# Patient Record
Sex: Female | Born: 1980 | Race: White | Hispanic: No | Marital: Married | Smoking: Never smoker
Health system: Southern US, Community
[De-identification: ages and names within clinical notes are randomized; demographics above are authoritative.]

## PROBLEM LIST (undated history)

## (undated) DIAGNOSIS — O26899 Other specified pregnancy related conditions, unspecified trimester: Secondary | ICD-10-CM

## (undated) DIAGNOSIS — G5603 Carpal tunnel syndrome, bilateral upper limbs: Secondary | ICD-10-CM

## (undated) DIAGNOSIS — N979 Female infertility, unspecified: Secondary | ICD-10-CM

## (undated) DIAGNOSIS — O3090 Multiple gestation, unspecified, unspecified trimester: Secondary | ICD-10-CM

## (undated) DIAGNOSIS — J4520 Mild intermittent asthma, uncomplicated: Secondary | ICD-10-CM

## (undated) DIAGNOSIS — R12 Heartburn: Secondary | ICD-10-CM

## (undated) DIAGNOSIS — Z6791 Unspecified blood type, Rh negative: Secondary | ICD-10-CM

## (undated) DIAGNOSIS — Z98891 History of uterine scar from previous surgery: Secondary | ICD-10-CM

## (undated) HISTORY — DX: Mild intermittent asthma, uncomplicated: J45.20

## (undated) HISTORY — PX: WISDOM TOOTH EXTRACTION: SHX21

## (undated) HISTORY — PX: INGUINAL HERNIA REPAIR: SUR1180

## (undated) HISTORY — DX: Female infertility, unspecified: N97.9

## (undated) HISTORY — PX: NO PAST SURGERIES: SHX2092

---

## 2008-06-17 ENCOUNTER — Encounter: Payer: Self-pay | Admitting: Internal Medicine

## 2008-06-17 ENCOUNTER — Ambulatory Visit: Payer: Self-pay | Admitting: Internal Medicine

## 2008-06-17 ENCOUNTER — Other Ambulatory Visit: Admission: RE | Admit: 2008-06-17 | Discharge: 2008-06-17 | Payer: Self-pay | Admitting: Internal Medicine

## 2008-06-17 DIAGNOSIS — J45909 Unspecified asthma, uncomplicated: Secondary | ICD-10-CM | POA: Insufficient documentation

## 2008-06-22 ENCOUNTER — Encounter: Payer: Self-pay | Admitting: Internal Medicine

## 2008-07-09 ENCOUNTER — Telehealth: Payer: Self-pay | Admitting: Internal Medicine

## 2008-07-17 ENCOUNTER — Ambulatory Visit: Payer: Self-pay | Admitting: Family Medicine

## 2008-07-17 DIAGNOSIS — N6019 Diffuse cystic mastopathy of unspecified breast: Secondary | ICD-10-CM

## 2008-07-17 HISTORY — DX: Diffuse cystic mastopathy of unspecified breast: N60.19

## 2009-03-13 ENCOUNTER — Telehealth: Payer: Self-pay | Admitting: Family Medicine

## 2009-03-13 ENCOUNTER — Encounter: Payer: Self-pay | Admitting: Internal Medicine

## 2009-06-28 ENCOUNTER — Ambulatory Visit: Payer: Self-pay | Admitting: Internal Medicine

## 2009-07-30 ENCOUNTER — Ambulatory Visit: Payer: Self-pay | Admitting: Internal Medicine

## 2009-09-08 ENCOUNTER — Telehealth: Payer: Self-pay | Admitting: Family Medicine

## 2010-01-08 ENCOUNTER — Encounter: Payer: Self-pay | Admitting: Internal Medicine

## 2010-01-08 ENCOUNTER — Emergency Department: Payer: Self-pay | Admitting: Emergency Medicine

## 2010-01-08 ENCOUNTER — Encounter: Payer: Self-pay | Admitting: Pulmonary Disease

## 2010-01-21 ENCOUNTER — Encounter: Payer: Self-pay | Admitting: Internal Medicine

## 2010-01-21 ENCOUNTER — Ambulatory Visit: Payer: Self-pay | Admitting: Internal Medicine

## 2010-02-22 ENCOUNTER — Ambulatory Visit: Payer: Self-pay | Admitting: Internal Medicine

## 2010-02-22 ENCOUNTER — Telehealth: Payer: Self-pay | Admitting: Internal Medicine

## 2010-03-08 ENCOUNTER — Encounter: Payer: Self-pay | Admitting: Pulmonary Disease

## 2010-03-08 ENCOUNTER — Ambulatory Visit: Payer: Self-pay | Admitting: Pulmonary Disease

## 2010-03-19 ENCOUNTER — Encounter: Payer: Self-pay | Admitting: Pulmonary Disease

## 2010-03-31 ENCOUNTER — Ambulatory Visit
Admission: RE | Admit: 2010-03-31 | Discharge: 2010-03-31 | Payer: Self-pay | Source: Home / Self Care | Attending: Family Medicine | Admitting: Family Medicine

## 2010-04-04 ENCOUNTER — Encounter
Admission: RE | Admit: 2010-04-04 | Discharge: 2010-04-04 | Payer: Self-pay | Source: Home / Self Care | Attending: Family Medicine | Admitting: Family Medicine

## 2010-04-04 LAB — HM MAMMOGRAPHY: HM Mammogram: NORMAL

## 2010-04-05 ENCOUNTER — Ambulatory Visit
Admission: RE | Admit: 2010-04-05 | Discharge: 2010-04-05 | Payer: Self-pay | Source: Home / Self Care | Attending: Pulmonary Disease | Admitting: Pulmonary Disease

## 2010-04-05 ENCOUNTER — Encounter: Payer: Self-pay | Admitting: Pulmonary Disease

## 2010-04-19 NOTE — Progress Notes (Signed)
Summary: pt is bloated  Phone Note Call from Patient Call back at Home Phone [phone]   Caller: Patient Call For: Cindee Salt MD Summary of Call: Pt states she is very bloated x 4 days- abdominal.  She says she gained 6 pounds in 5 days while out of town in [place].  She says her abd and sides are very tender.  She had a small BM today, which was the first one since saturday morning.  She usually drinks coffee and that brings on BM's but that isint working.  Should she try a laxative? Initial call taken by: Lowella Petties CMA,  September 08, 2009 2:39 PM  Follow-up for Phone Call        discussed Follow-up by: Hannah Beat MD,  September 08, 2009 2:42 PM  Additional Follow-up for Phone Call Additional follow up Details #1::        Advised pt to try miralax, colace stool softener, lots of water.  Call back if not better. Additional Follow-up by: Lowella Petties CMA,  September 08, 2009 2:44 PM

## 2010-04-19 NOTE — Progress Notes (Signed)
Summary: Asthma  Phone Note Call from Patient Call back at Home Phone [phone]   Caller: Patient Call For: Cindee Salt MD Summary of Call: pt came in for x-ray today and also wanted to ask if she could get a referral to pulmonary? pt states she had a really bad asthma attack and had to take the Ventolin 3times before it calmed down. Pt thinks she needs a specialist, pt would like something in [place] or [place]. Please advise. Initial call taken by: Mervin Hack CMA Duncan Dull),  February 22, 2010 11:24 AM  Follow-up for Phone Call        Please let her know that the CXR was normal  I will go ahead with pulmonary referral Follow-up by: Cindee Salt MD,  February 22, 2010 1:17 PM

## 2010-04-19 NOTE — Assessment & Plan Note (Signed)
Summary: 1 m f/u dlo   Vital Signs:  Patient profile:   30 year old female Weight:      164 pounds Temp:     98.3 degrees F oral Pulse rate:   78 / minute Pulse rhythm:   regular Resp:     12 per minute BP sitting:   122 / 80  (left arm) Cuff size:   regular  Vitals Entered By: Mervin Hack CMA Duncan Dull) (Jul 30, 2009 9:21 AM) CC: 1 month follow-up   History of Present Illness: Does feel better Still gets some tight sensation at night--able to avoid the inhaler Not as noticeable post prandial problems  Has been using inhaler before exercise--then does well without more need No restrictions on activity  No sig cough No nocturnal cough  Allergies: 1)  ! Adult Aspirin Ec Low Strength (Aspirin)  Past History:  Past medical, surgical, family and social histories (including risk factors) reviewed for relevance to current acute and chronic problems.  Past Medical History: Reviewed history from 06/17/2008 and no changes required. Asthma--mild intermittent  Past Surgical History: Reviewed history from 06/17/2008 and no changes required. Denies surgical history  Family History: Reviewed history from 06/17/2008 and no changes required. Dad has HTN Mom healthy 1 brother 1 sister--obesity No CAD, DM< No breast cancer or colon cancer  Social History: Reviewed history from 06/28/2009 and no changes required. 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   Neck:  supple, no masses, no thyromegaly, no carotid bruits, and no cervical lymphadenopathy.   Lungs:  normal respiratory effort, no intercostal retractions, no accessory muscle use, normal breath sounds, no crackles, and no wheezes.   Additional Exam:  spirometry shows 10% increase in FEV1 but slight decrease in  FVC   Impression & Recommendations:  Problem # 1:  ASTHMA (ICD-493.90) Assessment Improved  clinically improved--back to mild intermittent status spirometry not really much different though continue current dose consider wean down in 6 months if stable  Her updated medication list for this problem includes:    Ventolin Hfa 108 (90 Base) Mcg/act Aers (Albuterol sulfate) .Marland Kitchen... 2 puffs three times a day as needed for asthma symptoms    Qvar 80 Mcg/act Aers (Beclomethasone dipropionate) .Marland Kitchen... 1 inhalation two times a day with spacer. rinse mouth after  Orders: Spirometry w/Graph (94010)  Complete Medication List: 1)  Ventolin Hfa 108 (90 Base) Mcg/act Aers (Albuterol sulfate) .... 2 puffs three times a day as needed for asthma symptoms 2)  Qvar 80 Mcg/act Aers (Beclomethasone dipropionate) .Marland Kitchen.. 1 inhalation two times a day with spacer. rinse mouth after  Patient Instructions: 1)  Please schedule a follow-up appointment in 6 months .   Current Allergies (reviewed today): ! ADULT ASPIRIN EC LOW STRENGTH (ASPIRIN)

## 2010-04-19 NOTE — Assessment & Plan Note (Signed)
Summary: ROA FOR 6 MONTH FOLLOW-UP/JRR   Vital Signs:  Patient profile:   30 year old female Weight:      152 pounds Temp:     98.3 degrees F oral Resp:     18 per minute BP sitting:   120 / 80  (left arm) Cuff size:   regular  Vitals Entered By: Mervin Hack CMA Duncan Dull) (January 21, 2010 9:37 AM) CC: 6 month follow-up   History of Present Illness: Caught cold a few weeks ago tried cold meds sneezing, nasal congestion, body aches. May have had fever Then started struggling with her breathing Had DOE --so tried steam inhalation  Got worse and went to the [facility] ER 10/22 CXR showed "spot of pneumonia" Got 2 breathing treatments Antibiotic and prednisone- four days only did have flare of her asthma again last night  Stopped q-var this summer Had no sig problems off it--would occ get a bad day Using the rescue inhaler just before she ran and othewise  ~once per week   Allergies: 1)  ! Adult Aspirin Ec Low Strength (Aspirin)  Past History:  Past medical, surgical, family and social histories (including risk factors) reviewed for relevance to current acute and chronic problems.  Past Medical History: Reviewed history from 06/17/2008 and no changes required. Asthma--mild intermittent  Past Surgical History: Reviewed history from 06/17/2008 and no changes required. Denies surgical history  Family History: Reviewed history from 06/17/2008 and no changes required. Dad has HTN Mom healthy 1 brother 1 sister--obesity No CAD, DM< No breast cancer or colon cancer  Social History: Reviewed history from 06/28/2009 and no changes required. 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   Nose:  no mucosal edema or sig inflammation Mouth:  no  erythema and no exudates.   Neck:  supple, no masses, and no cervical lymphadenopathy.   Lungs:  normal respiratory effort, no intercostal retractions, no accessory muscle use, normal breath sounds, no dullness, no crackles, and no wheezes.     Impression & Recommendations:  Problem # 1:  PNEUMONIA (ICD-486) Assessment New sounds like this is in doubt I will review the ER records when I can get them (no success with multiple efforts today)   got them did have questionable right hilar area will set up CXR in about 1 month  Problem # 2:  ASTHMA (ICD-493.90) Assessment: Deteriorated  seems to have mild persistent symptoms prior to this illness doesn't want inhaled steroids as it really made her gain weight  P: will try 10 day prednisone course   ipratropium inhaler for now    consider singulair  The following medications were removed from the medication list:    Qvar 80 Mcg/act Aers (Beclomethasone dipropionate) .Marland Kitchen... 1 inhalation two times a day with spacer. rinse mouth after Her updated medication list for this problem includes:    Ventolin Hfa 108 (90 Base) Mcg/act Aers (Albuterol sulfate) .Marland Kitchen... 2 puffs three times a day as needed for asthma symptoms    Prednisone 20 Mg Tabs (Prednisone) .Marland Kitchen... 2 tabs daily for 5 days, then 1 tab daily for 5 days    Atrovent Hfa 17 Mcg/act Aers (Ipratropium bromide hfa) .Marland Kitchen... 2 inhalations with spacer three times a day for asthma  Orders: Spirometry  w/Graph (94010)  Complete Medication List: 1)  Ventolin Hfa 108 (90 Base) Mcg/act Aers (Albuterol sulfate) .... 2 puffs three times a day as needed for asthma symptoms 2)  Prednisone 20 Mg Tabs (Prednisone) .... 2 tabs daily for 5 days, then 1 tab daily for 5 days 3)  Atrovent Hfa 17 Mcg/act Aers (Ipratropium bromide hfa) .... 2 inhalations with spacer three times a day for asthma  Patient Instructions: 1)  Please schedule a follow-up appointment in 3-4  months for physical 2)  Please set up CXR  in about 1 month to follow up on possible pneumonia Prescriptions: VENTOLIN HFA 108 (90 BASE) MCG/ACT AERS (ALBUTEROL SULFATE) 2 puffs three times a day as needed for asthma symptoms  #1 x 1   Entered and Authorized by:   Cindee Salt MD   Signed by:   Cindee Salt MD on 01/21/2010   Method used:   Electronically to        [facility] [address] (retail)       [address], [address]       [place], [place]  [address]       Ph: [phone]       Fax: [phone]   RxID:   [id number] ATROVENT HFA 17 MCG/ACT AERS (IPRATROPIUM BROMIDE HFA) 2 inhalations with spacer three times a day for asthma  #1 x 11   Entered and Authorized by:   Cindee Salt MD   Signed by:   Cindee Salt MD on 01/21/2010   Method used:   Electronically to        [facility] [address] (retail)       [address], [address]       [place], [place]  [address]       Ph: [phone]       Fax: [phone]   RxID:   [id number] PREDNISONE 20 MG TABS (PREDNISONE) 2 tabs daily for 5 days, then 1 tab daily for 5 days  #15 x 0   Entered and Authorized by:   Cindee Salt MD   Signed by:   Cindee Salt MD on 01/21/2010   Method used:   Electronically to        [facility] [address] (retail)       [address], [address]       [place], [place]  [address]       Ph: [phone]       Fax: [phone]   RxID:   [id number]    Orders Added: 1)  Est. Patient Level IV [51761] 2)  Spirometry w/Graph [60737]    Current Allergies (reviewed today): ! ADULT ASPIRIN EC LOW STRENGTH (ASPIRIN)

## 2010-04-19 NOTE — Assessment & Plan Note (Signed)
Summary: PROBLEMS WITH ASTHMA   Vital Signs:  Patient profile:   30 year old female Weight:      166.25 pounds BMI:     30.52 O2 Sat:      99 % on Room air Temp:     98.0 degrees F oral Pulse rate:   76 / minute Pulse rhythm:   regular Resp:     14 per minute BP sitting:   120 / 82  (left arm) Cuff size:   regular  Vitals Entered By: Mervin Hack CMA Duncan Dull) (June 28, 2009 11:38 AM)  O2 Flow:  Room air CC: asthma   History of Present Illness:  Asthma has been acting up having weird triggers Notices trouble breathing after eating at times "to do anything quickly is a trouble"  using inhaler daily at least started about 3-4 weeks ago  Has lived here 27 years no previous problems with tree pollen  Has had to use inhaler before any  physical activity still walks and plays dodge ball  No major cough  No mood issues gets slightly anxious when some SOB---otherwise mood okay  CXR done at [facility] on Christmas eve see phone note--had flare after using super glue  Allergies: 1)  ! Adult Aspirin Ec Low Strength (Aspirin)  Past History:  Past medical, surgical, family and social histories (including risk factors) reviewed for relevance to current acute and chronic problems.  Past Medical History: Reviewed history from 06/17/2008 and no changes required. Asthma--mild intermittent  Past Surgical History: Reviewed history from 06/17/2008 and no changes required. Denies surgical history  Family History: Reviewed history from 06/17/2008 and no changes required. 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   Nose:  no sig allergic  swelling Mouth:  no erythema and no exudates.   Neck:  supple and no masses.   Lungs:  normal respiratory effort, no intercostal retractions, no accessory muscle use, normal breath sounds, no crackles, and no wheezes.   Heart:  normal rate, regular rhythm, no murmur, and no gallop.   Additional Exam:  Spirometry---  not great technique                         Moderate obstruction   Impression & Recommendations:  Problem # 1:  ASTHMA (ICD-493.90) Assessment Deteriorated  will add inhaled steroid should allow decreased symptoms or would consider adding long acting beta agonist as well may have been triggered by the super glue exposure but this seemed not that significant and symptoms are bad several months later   Her updated medication list for this problem includes:    Ventolin Hfa 108 (90 Base) Mcg/act Aers (Albuterol sulfate) .Marland Kitchen... 2 puffs three times a day as needed for asthma symptoms    Qvar 80 Mcg/act Aers (Beclomethasone dipropionate) .Marland Kitchen... 1 inhalation two times a day with spacer. rinse mouth after  Orders: Spirometry w/Graph (94010)  Complete Medication List: 1)  Ventolin Hfa 108 (90 Base) Mcg/act Aers (Albuterol sulfate) .... 2 puffs three times a day as needed for asthma symptoms 2)  Qvar 80 Mcg/act Aers (Beclomethasone dipropionate) .Marland Kitchen.. 1 inhalation two times a day with spacer.  rinse mouth after  Patient Instructions: 1)  Please schedule a follow-up appointment in 1 month.  Prescriptions: QVAR 80 MCG/ACT AERS (BECLOMETHASONE DIPROPIONATE) 1 inhalation two times a day with spacer. Rinse mouth after  #1 x 11   Entered and Authorized by:   Cindee Salt MD   Signed by:   Cindee Salt MD on 06/28/2009   Method used:   Electronically to        [facility] [address] (retail)       [address], [address]       [place], [place]  [address]       Ph: [phone]       Fax: [phone]   RxID:   [id number]   Current  Allergies (reviewed today): ! ADULT ASPIRIN EC LOW STRENGTH (ASPIRIN)

## 2010-04-21 NOTE — Assessment & Plan Note (Signed)
Summary: consult for asthma   Copy to:  [facility] Primary Melissia Lahman/Referring Remi Rester:  Cindee Salt MD  CC:  Pulmonary Consult.  History of Present Illness: the pt is a 30y/o female who I have been asked to see for management of asthma.  She was diagnosed with asthma as a child, and her symptoms have primarily been with exercise and large temperature changes during the year.  She felt her control was adequate until this year, when she began to have sob with mild exertional activities and sometimes with eating.  However, she could run 3-4 miles and not have any issues (although she would use her ventolin inhaler beforehand for EIA).  The pt has had what sounds like acute asthmatic bronchitis in Oct of this year, and required abx and a course of prednisone before improving.  She has had a recent cxr that was normal, and recent spirometry shows poor technique with inadequate flow volume loop.  Spirometry from April of this year was adequate and did show airflow obstruction.  The pt has been tried on qvar in the past, but does not remember if this helped or not.  She is convinced this did result in weight gain while using it, and therefore she discontinued.  She currently has some cough, but no mucus or chest congestion.  She has some hoarseness that is ongoing, but denies PND or reflux symptoms.  She does not have a h/o frequent sinus disease or allergies.  Current Medications (verified): 1)  Ventolin Hfa 108 (90 Base) Mcg/act Aers (Albuterol Sulfate) .... 2 Puffs Three Times A Day As Needed For Asthma Symptoms  Allergies (verified): 1)  ! Adult Aspirin Ec Low Strength (Aspirin)  Past History:  Past Medical History: Reviewed history from 06/17/2008 and no changes required. Asthma--mild intermittent  Past Surgical History: Reviewed history from 06/17/2008 and no changes required. Denies surgical history  Family History: Reviewed history from 06/17/2008 and no changes  required. Dad has HTN Mom healthy 1 brother 1 sister--obesity No CAD, DM< No breast cancer or colon cancer  Social History: Reviewed history from 06/28/2009 and no changes required. 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  The patient denies coughing up blood, chest pain, irregular heartbeats, acid heartburn, indigestion, loss of appetite, abdominal pain, tooth/dental problems, headaches, nasal congestion/difficulty breathing through nose, sneezing, itching, ear ache, anxiety, depression, hand/feet swelling, joint stiffness or pain, rash, change in color of mucus, and fever.    Vital Signs:  Patient profile:   30 year old female Height:      62 inches Weight:      158.13 pounds BMI:     29.03 O2 Sat:      96 % on Room air Temp:     98.4 degrees F oral Pulse rate:   89 / minute BP sitting:   126 / 82  (left arm) Cuff size:   regular  Vitals Entered By: Arman Filter LPN (March 08, 2010 8:55 AM)  O2 Flow:  Room air CC: Pulmonary Consult Comments Medications reviewed with patient Arman Filter LPN  March 08, 2010 8:56 AM    Physical Exam  General:  wd female in nad Eyes:  PERRLA and EOMI.   Nose:  patent without discharge,  no purulence seen Mouth:  no exudates or lesions seen Neck:  no jvd, tmg, LN Lungs:  clear to auscultation, no wheezing or rhonchi Heart:  rrr, no mrg Abdomen:  soft and nontender, bs+ Extremities:  no edema noted, pulses intact distally no cyanosis Neurologic:  alert and oriented, moves all 4.   Impression & Recommendations:  Problem # 1:  ASTHMA (ICD-493.90) the pt's spirometry today shows clear cut airflow obstruction.  She will need to get on an ICS,  and stay on this religiously.  I have had a long conversation with her about the role of airway inflammation in asthma, and that it requires daily maintenance meds to keep controlled.  I have also explained that if we do not control the inflammation, she is at risk for fixed obstructive disease later in life due to airway remodeling.  She believes that qvar caused excessive weight gain (unlikely), so will try her on asmanex at bedtime.  Hopefully, she does not require combination therapy.    Medications Added to Medication List This Visit: 1)  Asmanex 60 Metered Doses 220 Mcg/inh Aepb (Mometasone furoate) .... 2 each night at bedtime  Other Orders: Consultation Level IV (21308) Spirometry w/Graph (94010)  Patient Instructions: 1)  stop atrovent inhaler 2)  use ventolin for rescue only 3)  asmanex 2 inhalations each night at bedtime 4)  followup with me in 4 weeks.  Prescriptions: ASMANEX 60 METERED DOSES 220 MCG/INH AEPB (MOMETASONE FUROATE) 2 each night at bedtime  #1 x 6   Entered and Authorized by:   Barbaraann Share MD   Signed by:   Barbaraann Share MD on 03/08/2010   Method used:   Print then Give to Patient   RxID:   [id number]

## 2010-04-21 NOTE — Assessment & Plan Note (Signed)
Summary: rov for asthma   Copy to:  [facility] Primary Provider/Referring Provider:  Cindee Salt MD  CC:  4 week f/u appt.  Pt states breathing is unchanged since starting Asmanex.  Does c/o constipation and wonders if this is d/t the Asmanex.  Pt also states she is having to use rescue hfa every time she exercises. .  History of Present Illness: the pt comes in today for f/u of her known asthma.  She has been started on asmanex for maintenance therapy, and feels this is well tolerated and works well for her.  She still has to use her rescue inhaler for cold air exposure and before she exercises, but I wonder if this is out of fear her maintenance inhaler will not hold her during her exertional activities.  She does not feel she is overusing her albuterol.    Medications Prior to Update: 1)  Ventolin Hfa 108 (90 Base) Mcg/act Aers (Albuterol Sulfate) .... 2 Puffs Three Times A Day As Needed For Asthma Symptoms 2)  Asmanex 60 Metered Doses 220 Mcg/inh Aepb (Mometasone Furoate) .... 2 Each Night At Bedtime  Allergies (verified): 1)  ! Adult Aspirin Ec Low Strength (Aspirin)  Review of Systems       The patient complains of shortness of breath with activity, shortness of breath at rest, sneezing, and itching.  The patient denies productive cough, non-productive cough, coughing up blood, chest pain, irregular heartbeats, acid heartburn, indigestion, loss of appetite, weight change, abdominal pain, difficulty swallowing, sore throat, tooth/dental problems, headaches, nasal congestion/difficulty breathing through nose, ear ache, anxiety, depression, hand/feet swelling, joint stiffness or pain, rash, change in color of mucus, and fever.    Vital Signs:  Patient profile:   30 year old female Height:      62 inches Weight:      157.50 pounds BMI:     28.91 O2 Sat:      98 % on Room air Temp:     97.6 degrees F oral Pulse rate:   84 / minute BP sitting:   124 / 76  (left arm) Cuff  size:   regular  Vitals Entered By: Arman Filter LPN (April 05, 2010 9:30 AM)  O2 Flow:  Room air CC: 4 week f/u appt.  Pt states breathing is unchanged since starting Asmanex.  Does c/o constipation and wonders if this is d/t the Asmanex.  Pt also states she is having to use rescue hfa every time she exercises.  Comments Medications reviewed with patient Arman Filter LPN  April 05, 2010 9:34 AM    Physical Exam  General:  wd female in nad Lungs:  clear to auscultation Heart:  rrr Extremities:  no edema or cyanosis  Neurologic:  alert and oriented, moves all 4.   Impression & Recommendations:  Problem # 1:  ASTHMA (ICD-493.90) the pt is still requiring albuterol prior to exercise and with cold air exposure despite taking asmanex compliantly.  She is not having to overuse her rescue inhaler.  Her spirometry today is a little better, but still has mild airflow obstruction.  We discussed today the possibility of stepping up her asthma therapy to LABA/ICS, but pt feels she is better and would like to give the asmanex a little more time.  I am ok with this, provided she does not have to overuse her albuterol.    Other Orders: Est. Patient Level III ([address]) Spirometry w/Graph (94010)  Patient Instructions: 1)  stay on asmanex 2 puffs  at bedtime. 2)  let me know if you are not doing as well as you think you should, or if you have to overuse your rescue inhaler. 3)  followup with me in 6mos.

## 2010-04-21 NOTE — Assessment & Plan Note (Signed)
Summary: RIGHT BREAST IS HURTING/ lb   Vital Signs:  Patient profile:   30 year old female Height:      62 inches Weight:      156 pounds BMI:     28.64 Temp:     98.3 degrees F oral Pulse rate:   65 / minute Pulse rhythm:   regular BP sitting:   120 / 82  (left arm) Cuff size:   regular  Vitals Entered By: Linde Gillis CMA Duncan Dull) (March 31, 2010 9:40 AM) CC: right breast pain   History of Present Illness: 30 yo here for right breast pain.  Two days ago, woke up with 8/10 right breast tenderness, started at 12 oclock position and now more at 9 oclock.  Was very TTP and she thought she felt a mass.  NO erythema or warmth.  Pain has now subsided.  There was not trauma or injury to the breast.  Not menstruating, had her period 2 weeks ago. Never had anything like this before.  No discharge or changes of her nipple. No FH of breast CA.  Current Medications (verified): 1)  Ventolin Hfa 108 (90 Base) Mcg/act Aers (Albuterol Sulfate) .... 2 Puffs Three Times A Day As Needed For Asthma Symptoms 2)  Asmanex 60 Metered Doses 220 Mcg/inh Aepb (Mometasone Furoate) .... 2 Each Night At Bedtime  Allergies: 1)  ! Adult Aspirin Ec Low Strength (Aspirin)  Past History:  Past Medical History: Last updated: 06/17/2008 Asthma--mild intermittent  Past Surgical History: Last updated: 06/17/2008 Denies surgical history  Family History: Last updated: 06/17/2008 Dad has HTN Mom healthy 1 brother 1 sister--obesity No CAD, DM< No breast cancer or colon cancer  Social History: Last updated: 03/08/2010 Occupation: Automotive engineer for [employer] Married 6/10 to [employer] No children Never Smoked Alcohol use-yes  Risk Factors: Smoking Status: never (06/17/2008)  Review of Systems      See HPI General:  Denies fever and malaise. GI:  Denies nausea and vomiting.  Physical Exam  General:  alert and normal appearance.   Mouth:  no erythema and no exudates.   Breasts:   skin/areolae normal, no abnormal thickening, no nipple discharge, and no adenopathy.   Right breast does feel more dense, fibrous dense area at 12 oclock position, non tender to palpation. Psych:  normally interactive, good eye contact, not anxious appearing, and not depressed appearing.     Impression & Recommendations:  Problem # 1:  ? of LUMP OR MASS IN BREAST (ICD-611.72) Assessment New ? fibrocystic area vs breast abscess. Will get mammogram as we cannot localize one area of tenderness. Pt in agreement with plan. Orders: Radiology Referral (Radiology)  Complete Medication List: 1)  Ventolin Hfa 108 (90 Base) Mcg/act Aers (Albuterol sulfate) .... 2 puffs three times a day as needed for asthma symptoms 2)  Asmanex 60 Metered Doses 220 Mcg/inh Aepb (Mometasone furoate) .... 2 each night at bedtime  Patient Instructions: 1)  Please stop by to see Shirlee Limerick on your way out.     Orders Added: 1)  Radiology Referral [Radiology] 2)  Est. Patient Level IV [72536]    Current Allergies (reviewed today): ! ADULT ASPIRIN EC LOW STRENGTH (ASPIRIN)

## 2010-04-30 ENCOUNTER — Encounter: Payer: Self-pay | Admitting: Internal Medicine

## 2010-04-30 DIAGNOSIS — N979 Female infertility, unspecified: Secondary | ICD-10-CM

## 2010-04-30 HISTORY — DX: Female infertility, unspecified: N97.9

## 2010-06-23 ENCOUNTER — Encounter: Payer: Self-pay | Admitting: Internal Medicine

## 2010-06-23 ENCOUNTER — Other Ambulatory Visit: Payer: Self-pay | Admitting: Internal Medicine

## 2010-06-23 ENCOUNTER — Ambulatory Visit (INDEPENDENT_AMBULATORY_CARE_PROVIDER_SITE_OTHER): Payer: BC Managed Care – PPO | Admitting: Internal Medicine

## 2010-06-23 VITALS — BP 130/70 | HR 76 | Temp 98.1°F | Ht 62.0 in | Wt 155.0 lb

## 2010-06-23 DIAGNOSIS — Z0000 Encounter for general adult medical examination without abnormal findings: Secondary | ICD-10-CM | POA: Insufficient documentation

## 2010-06-23 DIAGNOSIS — J45909 Unspecified asthma, uncomplicated: Secondary | ICD-10-CM

## 2010-06-23 NOTE — Patient Instructions (Signed)
Please discuss the asmanex with Dr Shelle Iron when you are ready to try to conceive a child

## 2010-06-23 NOTE — Progress Notes (Signed)
Addended byMills Koller on: 06/23/2010 03:14 PM   Modules accepted: Orders

## 2010-06-23 NOTE — Progress Notes (Signed)
Subjective:    Patient ID: Nicole Owens, female    DOB: Oct 05, 1980, 30 y.o.   MRN: [id number]  HPI Doing well Asthma seems to be better on the asmanex Hasn't had weight gain as much--has been very careful No sig cough or SOB Does still use the rescue inhaler before exercise (runs 4-5 days per week)  Breast pain is gone Cyst found on ultrasound  Not on birth control now Husband has Kleinfelters--diagnosed just 2 years ago Considering artificial insemination  Past Medical History  Diagnosis Date  . Asthma, intermittent     mild    No past surgical history on file.  Family History  Problem Relation Age of Onset  . Hypertension Father   . Obesity Sister     History   Social History  . Marital Status: Married    Spouse Name: Stacie Templin    Number of Children: 0  . Years of Education: N/A   Occupational History  . Teacher, English as a foreign language   Social History Main Topics  . Smoking status: Never Smoker   . Smokeless tobacco: Not on file  . Alcohol Use: Yes  . Drug Use: Not on file  . Sexually Active: Not on file   Other Topics Concern  . Not on file   Social History Narrative  . No narrative on file   Review of Systems  Constitutional: Negative for activity change, fatigue and unexpected weight change.       Wears seat belt  HENT: Positive for congestion and rhinorrhea. Negative for hearing loss, dental problem and tinnitus.        Takes allergy meds for cats in her house  Eyes: Negative for visual disturbance.       No vision loss or diplopia  Respiratory: Negative for cough, chest tightness, shortness of breath and wheezing.   Cardiovascular: Positive for palpitations. Negative for chest pain and leg swelling.       Occ palps if anxious about something  Gastrointestinal: Positive for constipation and abdominal distention. Negative for nausea, vomiting and blood in stool.       Occ bloating and constipation Better with increased water No heartburn   Genitourinary: Negative for dysuria, difficulty urinating, vaginal pain, pelvic pain and dyspareunia.  Musculoskeletal: Negative for back pain, joint swelling and arthralgias.  Skin: Negative for rash.       No suspicious lesions  Neurological: Negative for dizziness, syncope, weakness, light-headedness and headaches.  Hematological: Negative for adenopathy. Does not bruise/bleed easily.  Psychiatric/Behavioral: Negative for sleep disturbance and dysphoric mood. The patient is nervous/anxious.        Gets anxious over work at times       Objective:   Physical Exam  Constitutional: She is oriented to person, place, and time. She appears well-developed and well-nourished. No distress.  HENT:  Head: Normocephalic and atraumatic.  Mouth/Throat: Oropharynx is clear and moist. No oropharyngeal exudate.       TMs normal  Eyes: Conjunctivae and EOM are normal. Pupils are equal, round, and reactive to light.       Fundi benign  Neck: Normal range of motion. Neck supple. No thyromegaly present.  Cardiovascular: Normal rate, regular rhythm, normal heart sounds and intact distal pulses.  Exam reveals no gallop.   No murmur heard. Pulmonary/Chest: Effort normal and breath sounds normal. No respiratory distress. She has no wheezes. She has no rales.  Abdominal: Soft. She exhibits no mass. There is no tenderness.  Musculoskeletal: Normal range of motion. She  exhibits no edema and no tenderness.  Lymphadenopathy:    She has no cervical adenopathy.  Neurological: She is alert and oriented to person, place, and time. She exhibits normal muscle tone.       Normal strength  Skin: Skin is warm. No rash noted.       No suspicious lesions--but many benign nevi and a few seb keratoses  Psychiatric: She has a normal mood and affect. Her behavior is normal. Judgment and thought content normal.          Assessment & Plan:

## 2010-10-03 ENCOUNTER — Other Ambulatory Visit: Payer: Self-pay | Admitting: Internal Medicine

## 2010-10-04 ENCOUNTER — Telehealth: Payer: Self-pay | Admitting: *Deleted

## 2010-10-04 ENCOUNTER — Ambulatory Visit (INDEPENDENT_AMBULATORY_CARE_PROVIDER_SITE_OTHER): Payer: BC Managed Care – PPO | Admitting: Pulmonary Disease

## 2010-10-04 ENCOUNTER — Encounter: Payer: Self-pay | Admitting: Pulmonary Disease

## 2010-10-04 VITALS — BP 102/70 | HR 88 | Temp 97.5°F | Ht 62.0 in | Wt 157.6 lb

## 2010-10-04 DIAGNOSIS — J45909 Unspecified asthma, uncomplicated: Secondary | ICD-10-CM

## 2010-10-04 NOTE — Progress Notes (Signed)
  Subjective:    Patient ID: Nicole Owens, female    DOB: 10-Feb-1981, 30 y.o.   MRN: [id number]  HPI The pt comes in today for f/u of her known asthma.  She is doing fairly well with asmanex alone, and has not had an exacerbation since last visit.  She does still have to use albuterol before strenuous exercise, and on rare occasions at night.  Overall though, she is very pleased with her control.  Denies cough or congestion.    Review of Systems  Constitutional: Negative for fever and unexpected weight change.  HENT: Positive for sore throat, rhinorrhea and sneezing. Negative for ear pain, nosebleeds, congestion, trouble swallowing, dental problem, postnasal drip and sinus pressure.   Eyes: Positive for redness and itching.  Respiratory: Positive for shortness of breath. Negative for cough, chest tightness and wheezing.   Cardiovascular: Negative for palpitations and leg swelling.  Gastrointestinal: Negative for nausea and vomiting.  Genitourinary: Negative for dysuria.  Musculoskeletal: Negative for joint swelling.  Skin: Negative for rash.  Neurological: Negative for headaches.  Hematological: Bruises/bleeds easily.  Psychiatric/Behavioral: Negative for dysphoric mood. The patient is nervous/anxious.        Objective:   Physical Exam Wd female in nad Nares without purulence or discharge Chest totally clear, no wheezing Cor with rrr LE without edema, no cyanosis Alert, oriented, moves all 4        Assessment & Plan:

## 2010-10-04 NOTE — Patient Instructions (Signed)
Trial of dulera 100/5  2 inhalations am and pm.  Rinse mouth well.  If you try this for 3-4 weeks and see a benefit, can stay on this as maintenance.  If no difference, stay on your current asmanex.   If doing well, followup with me in one year.

## 2010-10-04 NOTE — Telephone Encounter (Signed)
Okay to set her up with appt for varivax

## 2010-10-04 NOTE — Telephone Encounter (Signed)
Pt states her OB told her that she needs to get a varicella vaccine before she tries to get pregnant because she doesn't have enough immunity to chicken pox.  She is asking if she can come in and get that.  She asked about getting zostavax but I told her her insurance probably wouldn't pay for it at her age.

## 2010-10-04 NOTE — Telephone Encounter (Signed)
Spoke with patient and advised results, she will call back and schedule nurse visit appt for varicella

## 2010-10-04 NOTE — Assessment & Plan Note (Signed)
The pt is doing fairly well with ICS alone, but I have some concern about her requirement for a rescue inhaler before all strenous exercise (which is everyday per patient).  I wonder if she would have better control with a LABA/ICS combination?  She is willing to try this, but I have asked her to go back to her current regimen if she does not see a difference.

## 2010-10-06 ENCOUNTER — Encounter: Payer: Self-pay | Admitting: Pulmonary Disease

## 2010-11-10 ENCOUNTER — Telehealth: Payer: Self-pay | Admitting: Pulmonary Disease

## 2010-11-10 MED ORDER — MOMETASONE FURO-FORMOTEROL FUM 100-5 MCG/ACT IN AERO: 2.0000 | INHALATION_SPRAY | Freq: Two times a day (BID) | RESPIRATORY_TRACT | Status: DC

## 2010-11-10 NOTE — Telephone Encounter (Signed)
Pt pt instructions from 10/04/10: Trial of dulera 100/5 2 inhalations am and pm. Rinse mouth well. If you try this for 3-4 weeks and see a benefit, can stay on this as maintenance. If no difference, stay on your current asmanex.  If doing well, followup with me in one year.    Pt called back.  She is at [facility] waiting on rx.  States she has been using this since last OV and breathing is better on this than on the asmanex.  States on normal days she is able to run without the need to use the rescue inhaler.  Has only been needing to use rescue HFA on the really hot, humid days which is an improvement from the asmanex.  She is requesting dulera 100/5 rx to be sent to [facility] today.  Rx sent -- pt aware.

## 2010-11-22 ENCOUNTER — Telehealth: Payer: Self-pay | Admitting: *Deleted

## 2010-11-22 NOTE — Telephone Encounter (Signed)
Okay to set up for varivax She may want to check her insurance--she may have to pay But okay to give

## 2010-11-22 NOTE — Telephone Encounter (Signed)
Pt is asking to come in for varicella vaccine.  She has had chicken pox twice- mild cases, but she had a titre drawn at her gyn office and her immunity is too low to prevent future infections.  She was told she has to have vaccine before she tries to get pregnant.

## 2010-11-23 NOTE — Telephone Encounter (Signed)
Spoke with patient and she will call her insurance and if it's not covered then she will go to the health dept.

## 2010-12-07 ENCOUNTER — Ambulatory Visit (INDEPENDENT_AMBULATORY_CARE_PROVIDER_SITE_OTHER): Payer: BC Managed Care – PPO | Admitting: *Deleted

## 2010-12-07 ENCOUNTER — Telehealth: Payer: Self-pay | Admitting: *Deleted

## 2010-12-07 DIAGNOSIS — Z23 Encounter for immunization: Secondary | ICD-10-CM

## 2010-12-07 NOTE — Telephone Encounter (Signed)
Patient came in today for nurse visit for varicella, per pt she will be trying to get pregnant within the next 28days, does she have to get a 2nd varicella? Pt did receive varicella today. Please advise.

## 2010-12-07 NOTE — Telephone Encounter (Signed)
I don't believe that a second varivax is indicated for adults Please check the guidelines we have posted from national imms practice comm

## 2010-12-08 NOTE — Telephone Encounter (Signed)
That sounds fine She should have reasonable protection Second dose should be after she delivers

## 2010-12-08 NOTE — Telephone Encounter (Signed)
It says that " all adults without evidence of immunity to varicella should receive 2 doses of single-antigen varicella vaccine or a second dose if they have received only 1 dose"  I'm confused, I printed the schedule and put it on your desk.

## 2010-12-08 NOTE — Telephone Encounter (Signed)
Spoke with patient and she will be trying to conceive in 1 mth ( it's arranged) so she will wait until after the baby to have the second dose.

## 2010-12-08 NOTE — Telephone Encounter (Signed)
I guess that 2 doses is recommended I believe they need to be at least 1 month apart----check this If more than that, should just stick with the single immunization before she tries to conceive

## 2011-01-04 ENCOUNTER — Telehealth: Payer: Self-pay | Admitting: Pulmonary Disease

## 2011-01-04 MED ORDER — MOMETASONE FURO-FORMOTEROL FUM 100-5 MCG/ACT IN AERO: 2.0000 | INHALATION_SPRAY | Freq: Two times a day (BID) | RESPIRATORY_TRACT | Status: DC

## 2011-01-04 NOTE — Telephone Encounter (Signed)
PT RETURNED CALL FROM TRIAGE. Nicole Owens  °

## 2011-01-04 NOTE — Telephone Encounter (Signed)
Spoke with patient informed her rx sent to pharmacy.

## 2011-01-04 NOTE — Telephone Encounter (Signed)
[email or web address]  

## 2011-02-14 ENCOUNTER — Other Ambulatory Visit: Payer: Self-pay | Admitting: Internal Medicine

## 2011-04-24 ENCOUNTER — Other Ambulatory Visit (HOSPITAL_COMMUNITY): Payer: Self-pay | Admitting: Obstetrics and Gynecology

## 2011-04-24 DIAGNOSIS — N971 Female infertility of tubal origin: Secondary | ICD-10-CM

## 2011-04-28 ENCOUNTER — Ambulatory Visit (HOSPITAL_COMMUNITY)
Admission: RE | Admit: 2011-04-28 | Discharge: 2011-04-28 | Disposition: A | Discharge status: Home / Self Care | Payer: BC Managed Care – PPO | Source: Ambulatory Visit | Attending: Obstetrics and Gynecology | Admitting: Obstetrics and Gynecology

## 2011-04-28 DIAGNOSIS — N971 Female infertility of tubal origin: Secondary | ICD-10-CM

## 2011-04-28 DIAGNOSIS — N979 Female infertility, unspecified: Secondary | ICD-10-CM | POA: Insufficient documentation

## 2011-04-28 MED ORDER — IOHEXOL 300 MG/ML  SOLN
7.0000 mL | Freq: Once | INTRAMUSCULAR | Status: AC | PRN
  Administered 2011-04-28: 5 mL

## 2011-05-19 ENCOUNTER — Ambulatory Visit (INDEPENDENT_AMBULATORY_CARE_PROVIDER_SITE_OTHER): Payer: BC Managed Care – PPO | Admitting: Gynecology

## 2011-05-19 ENCOUNTER — Encounter: Payer: Self-pay | Admitting: Gynecology

## 2011-05-19 VITALS — BP 130/78 | Ht 62.0 in | Wt 158.0 lb

## 2011-05-19 DIAGNOSIS — IMO0002 Reserved for concepts with insufficient information to code with codable children: Secondary | ICD-10-CM | POA: Insufficient documentation

## 2011-05-19 DIAGNOSIS — N970 Female infertility associated with anovulation: Secondary | ICD-10-CM

## 2011-05-19 DIAGNOSIS — J45909 Unspecified asthma, uncomplicated: Secondary | ICD-10-CM | POA: Insufficient documentation

## 2011-05-19 NOTE — Patient Instructions (Signed)
Office will follow up with you with hormone level results.

## 2011-05-19 NOTE — Progress Notes (Signed)
Nicole Owens [birth year] [id number]        31 y.o. G0 new patient presents for infertility. Patient has an interesting history in that her husband is a Hodgkin's survivor status post therapy and Klinefelter's syndrome. He was evaluated to include testicular biopsy and found to be azoospermic. They plan on donor sperm and have 5 samples from their chosen donor through [facility] and have used 2 samples at another practice and she is nervous about running out of samples. She historically has regular menses monthly every 28 days with moliminal symptoms to include bloating. She has monthly LH surges and had the 2 attempts based on [facility] surge. She then had luteal progesterones checked and was found to have low numbers one being 7.6.  Did a Clomid cycle without dissemination days 3 through 7  and was found to have a progesterone of 11.8. She subsequently had an HSG which is normal and no other evaluation. She has no history of weight changes, skin changes or galactorrhea. No history of pelvic infections, significant dysmenorrhea or worsening dysmenorrhea to suggest endometriosis. She does have a history of thyroid dysfunction and her family.  She is up-to-date as far as her routine GYN exams with last Pap smear May 2012.  Past medical history,surgical history, medications, allergies, family history and social history were all reviewed and documented in the EPIC chart. ROS:  Was performed and pertinent positives and negatives are included in the history.  Exam: Kim chaperone present Filed Vitals:   05/19/11 1118  BP: 130/78   General appearance  Normal Skin grossly normal Head/Neck normal with no cervical or supraclavicular adenopathy thyroid normal Lungs  clear Cardiac RR, without RMG Abdominal  soft, nontender, without masses, organomegaly or hernia Breasts  examined lying and sitting without masses, retractions, discharge or axillary adenopathy. 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  Cervix  normal    Uterus  anteverted, normal size, shape and contour, midline and mobile nontender   Adnexa  Without masses or tenderness    Anus and perineum  normal      Assessment/Plan:   26. 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 Husband azoospermic. Plans donor sperm with 2 failed cycles. HSG normal progesterone 11 on Clomid. No other blood work or studies done. We'll start with FSH TSH free T4 prolactin AMH estradiol. She is day 1-2 now. I reviewed options with them to include referral to a gynecologic endocrinologist. Trial of insemination with common stimulation alone or consider superovulation with either Clomid/gonadotropin or Letrozol/gonadotropin. In review of her [facility] charts interesting that spontaneously she ovulates around day 18 through 21 and then has a menses 8 or 9 days later which suggests a shortened luteal phase with luteal deficiency. Patient and her husband are going to think of all these issues follow up for her blood work and we'll go from there. 2. 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 Patient and told she's had this before and plans expectant management. Very benign in appearance and I agree with expectant management.    Dara Lords MD, 12:42 PM 05/19/2011

## 2011-05-20 LAB — TSH: TSH: 1.097 u[IU]/mL (ref 0.350–4.500)

## 2011-05-23 LAB — ANTI MULLERIAN HORMONE: AMH AssessR: 1.37 ng/mL

## 2011-05-25 ENCOUNTER — Telehealth: Payer: Self-pay | Admitting: *Deleted

## 2011-05-25 NOTE — Telephone Encounter (Signed)
Pt calling for results of all her labs. They seem normal to me I just wanted to double check with you. PLs advise.

## 2011-05-26 NOTE — Telephone Encounter (Signed)
(  incomplete previous note) Pt was informed that all labs normal per Dr Audie Box verbally.

## 2011-05-26 NOTE — Telephone Encounter (Signed)
Pt informed. I will call nsurance for inusrance benefits for medications and AI. Will call her back mon

## 2011-05-29 ENCOUNTER — Other Ambulatory Visit: Payer: Self-pay | Admitting: Internal Medicine

## 2011-06-05 ENCOUNTER — Telehealth: Payer: Self-pay | Admitting: *Deleted

## 2011-06-05 NOTE — Telephone Encounter (Signed)
Pt would like to know what cycle you are planning on doing. I have checked insurance benefits and she would like to proceed with her next cycle. She is due to start her period Easter weekend. Pls advise

## 2011-06-06 ENCOUNTER — Telehealth: Payer: Self-pay | Admitting: Gynecology

## 2011-06-06 NOTE — Telephone Encounter (Signed)
Patient calls and wants to begin a stimulated cycle. We will tentatively plan on a gonadotropin/letrozole cycle with one amp HMG/7.5 mg letrozole daily for 5 days starting day 2-3 of her cycle and then ultrasound 5 days later with release based on follicular growth. Are reviewed was involved with the cycle and the risks. I reviewed the risk of multiple gestation, possibilities for follicle aspiration with Dr. April Manson, possibilities of selective reduction, risk of hyperstimulation syndrome and a possible risk of ovarian cancer associated with stimulated cycles. Alternatives to include examination with Clomid stimulation only given her history of probable luteal phase deficiency versus adding the gonadotropin were also reviewed and ultimately as previously discussed options for referral to a reproductive endocrinologist also discussed. Patient was going to start her cycle and she will call us on the first day of her menses.

## 2011-06-19 ENCOUNTER — Telehealth: Payer: Self-pay | Admitting: *Deleted

## 2011-06-19 NOTE — Telephone Encounter (Signed)
Pt called to say she started her period Thursday 06/15/11. We will begin her medication Femara and Repronex and D3 of her cycle and have a D8 [place]. She will need injection instruction on D3 so therefore we will meet to do this Sat. [place] D8 scheduled. Pt will order her specimen for delivery for Friday 06/23/11.

## 2011-06-20 ENCOUNTER — Other Ambulatory Visit: Payer: Self-pay | Admitting: *Deleted

## 2011-06-20 ENCOUNTER — Encounter: Payer: Self-pay | Admitting: *Deleted

## 2011-06-20 DIAGNOSIS — N8300 Follicular cyst of ovary, unspecified side: Secondary | ICD-10-CM

## 2011-06-22 ENCOUNTER — Ambulatory Visit (INDEPENDENT_AMBULATORY_CARE_PROVIDER_SITE_OTHER): Payer: BC Managed Care – PPO

## 2011-06-22 ENCOUNTER — Ambulatory Visit (INDEPENDENT_AMBULATORY_CARE_PROVIDER_SITE_OTHER): Payer: BC Managed Care – PPO | Admitting: Gynecology

## 2011-06-22 ENCOUNTER — Ambulatory Visit: Payer: BC Managed Care – PPO | Admitting: Gynecology

## 2011-06-22 ENCOUNTER — Other Ambulatory Visit: Payer: BC Managed Care – PPO

## 2011-06-22 DIAGNOSIS — N8300 Follicular cyst of ovary, unspecified side: Secondary | ICD-10-CM

## 2011-06-23 ENCOUNTER — Telehealth: Payer: Self-pay | Admitting: Gynecology

## 2011-06-23 ENCOUNTER — Other Ambulatory Visit: Payer: Self-pay | Admitting: *Deleted

## 2011-06-23 DIAGNOSIS — N979 Female infertility, unspecified: Secondary | ICD-10-CM

## 2011-06-23 NOTE — Telephone Encounter (Signed)
I called the patient in follow up of her follicles study from yesterday. She has multiple small follicles the largest of 13 mm. I discussed with her almost has a PCO-type response with multiple small follicles. Options for management would be continuing low-dose gonadotropin hopefully selecting out for dominant follicles although risking development of multiple follicles versus coasting and reultrasound in to see if dominant follicles do not select out spontaneously. We have elected for coasting and she will reultrasound on Monday. I discussed various scenarios to include the ideal if 2 or 3 dominant follicles selected out versus if more than may consider referral to Dr. Dr. April Manson for follicle aspiration prior to IUI. If continued multiple small but no real dominant follicles may consider aborting this cycle saving the IUI specimen for future cycles and referred to Dr. April Manson to consider a different protocol. 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 Patient will follow up on Monday for her ultrasound and we will go from there.

## 2011-06-26 ENCOUNTER — Encounter: Payer: Self-pay | Admitting: Gynecology

## 2011-06-26 ENCOUNTER — Ambulatory Visit (INDEPENDENT_AMBULATORY_CARE_PROVIDER_SITE_OTHER): Payer: BC Managed Care – PPO | Admitting: Gynecology

## 2011-06-26 ENCOUNTER — Ambulatory Visit (INDEPENDENT_AMBULATORY_CARE_PROVIDER_SITE_OTHER): Payer: BC Managed Care – PPO

## 2011-06-26 DIAGNOSIS — N979 Female infertility, unspecified: Secondary | ICD-10-CM

## 2011-06-26 DIAGNOSIS — N4601 Organic azoospermia: Secondary | ICD-10-CM

## 2011-06-26 DIAGNOSIS — N8300 Follicular cyst of ovary, unspecified side: Secondary | ICD-10-CM

## 2011-06-26 NOTE — Patient Instructions (Signed)
Follow up for insemination as directed

## 2011-06-26 NOTE — Progress Notes (Signed)
Patient also or follicle study. Endometrium 10.7 mm trilayered, right ovary with 20 mm, 19.8 mm, 16.1 mm follicles. Discussed with patient cycle looks great we'll plan on ovidrel tonight with insemination 36 hours following.

## 2011-06-28 ENCOUNTER — Encounter: Payer: Self-pay | Admitting: Gynecology

## 2011-06-28 ENCOUNTER — Ambulatory Visit (INDEPENDENT_AMBULATORY_CARE_PROVIDER_SITE_OTHER): Payer: BC Managed Care – PPO | Admitting: Gynecology

## 2011-06-28 DIAGNOSIS — N979 Female infertility, unspecified: Secondary | ICD-10-CM

## 2011-06-28 NOTE — Progress Notes (Signed)
Patient presents for IUI. Had good Femara/Repronex cycle with 3 follicles. Received ovidrel approximately 36 hours ago.  Correct IUI specimen confirmed with patient and microscopic exam confirms a viable specimen.  Exam with Sherrilyn Rist chaperone present IUI performed without difficulty. Patient tolerated well. Patient will follow up in 2 weeks with results.

## 2011-06-28 NOTE — Patient Instructions (Signed)
Follow up in 2 weeks with results.

## 2011-06-29 ENCOUNTER — Telehealth: Payer: Self-pay | Admitting: *Deleted

## 2011-06-29 NOTE — Telephone Encounter (Signed)
Pt would like to appeal her insurance company decision to pay for the Femara. Can you write a letter to try and get it approved? If so it will need Date of service - rx filled 06/15/11. Reference [phone] and specific reasons as to why do not agree with the decision or why it should be covered. Please let me know. Thanks Sherrilyn Rist

## 2011-06-30 ENCOUNTER — Encounter: Payer: Self-pay | Admitting: Gynecology

## 2011-06-30 NOTE — Telephone Encounter (Signed)
Letter typed by Dr Audie Box and I mailed it out. Should get a response within 30 days.

## 2011-07-13 ENCOUNTER — Telehealth: Payer: Self-pay | Admitting: *Deleted

## 2011-07-13 DIAGNOSIS — N912 Amenorrhea, unspecified: Secondary | ICD-10-CM

## 2011-07-13 NOTE — Telephone Encounter (Signed)
Patient had an AID on Wed 06/28/11. And had a positive home preg test today. She is out of town and will come Monday for quant.

## 2011-07-17 ENCOUNTER — Telehealth: Payer: Self-pay | Admitting: *Deleted

## 2011-07-17 ENCOUNTER — Other Ambulatory Visit: Payer: BC Managed Care – PPO

## 2011-07-17 DIAGNOSIS — O3680X0 Pregnancy with inconclusive fetal viability, not applicable or unspecified: Secondary | ICD-10-CM

## 2011-07-17 DIAGNOSIS — N912 Amenorrhea, unspecified: Secondary | ICD-10-CM

## 2011-07-17 NOTE — Telephone Encounter (Signed)
Pt was called with results quant is great. schedule [place] early next week verbally per TF. Pt will call back to schedule and talk to appts. KW

## 2011-07-24 ENCOUNTER — Encounter: Payer: Self-pay | Admitting: Gynecology

## 2011-07-24 ENCOUNTER — Ambulatory Visit (INDEPENDENT_AMBULATORY_CARE_PROVIDER_SITE_OTHER): Payer: BC Managed Care – PPO | Admitting: Gynecology

## 2011-07-24 ENCOUNTER — Ambulatory Visit (INDEPENDENT_AMBULATORY_CARE_PROVIDER_SITE_OTHER): Payer: BC Managed Care – PPO

## 2011-07-24 DIAGNOSIS — O3680X0 Pregnancy with inconclusive fetal viability, not applicable or unspecified: Secondary | ICD-10-CM

## 2011-07-24 DIAGNOSIS — N912 Amenorrhea, unspecified: Secondary | ICD-10-CM

## 2011-07-24 DIAGNOSIS — O3090 Multiple gestation, unspecified, unspecified trimester: Secondary | ICD-10-CM

## 2011-07-24 DIAGNOSIS — O0900 Supervision of pregnancy with history of infertility, unspecified trimester: Secondary | ICD-10-CM

## 2011-07-24 LAB — US OB TRANSVAGINAL

## 2011-07-24 NOTE — Patient Instructions (Signed)
reultrasound in one week. Decreased level of activity. Continue on prenatal vitamins.

## 2011-07-24 NOTE — Progress Notes (Addendum)
Patient presents for ultrasound. Had Femara/Repronex cycle with 3 follicles stimulated and ovidrel release and AID April 10.  Ultrasound today shows four yolk sacs with 1 embryonic pole with cardiac motion (fetus D). A second less well developed without cardiac motion (fetus A) and a twin gestational sac without cardiac activity and unable at this point I'd the septum (fetus B/C).  I reviewed the ultrasound findings with the patient and her husband.  It appears that she fertilized all 3 follicles and that one split off as a twin gestation separately.  As per my telephone discussion with Tonia 06/06/2011 before initiating the cycle, I reviewed the risks of multiple gestation and possibilities of selective reduction. I again reviewed this with the patient and her husband. She at this point said that she is not sure she is interested in selective reduction and I reviewed with them that it's too early at this point to discuss it as we may lose one or 2 embryos early on. I did encourage them though that if they progress with quadruplets that the patient discussed selective reduction at least to hear the statistics before making a decision to be completely informed. I reviewed with them the issues of significant preterm delivery with the potential for loss of all fetuses, loss of some fetuses and the risk of significant sequelae in the surviving fetuses.  We will reultrasound her in one week and she understands that if she progresses that she will need to be taking care of by obstetrical group that deals with high risk obstetrics and that she depending on fetal number may need to be seen by a maternal fetal medicine specialist.  We will wait to refer her for selective reduction discussion until the viable number becomes clearer and I discussed with her that I will be referring her to either [facility] or [facility] that have experience with selective reduction.  I did ask her to limit activity and continue on her  prenatal vitamins and reporting bleeding or significant discomfort.

## 2011-07-31 ENCOUNTER — Ambulatory Visit (INDEPENDENT_AMBULATORY_CARE_PROVIDER_SITE_OTHER): Payer: BC Managed Care – PPO

## 2011-07-31 ENCOUNTER — Encounter: Payer: Self-pay | Admitting: Gynecology

## 2011-07-31 ENCOUNTER — Other Ambulatory Visit: Payer: Self-pay | Admitting: Gynecology

## 2011-07-31 ENCOUNTER — Telehealth: Payer: Self-pay | Admitting: Gynecology

## 2011-07-31 ENCOUNTER — Ambulatory Visit (INDEPENDENT_AMBULATORY_CARE_PROVIDER_SITE_OTHER): Payer: BC Managed Care – PPO | Admitting: Gynecology

## 2011-07-31 DIAGNOSIS — N912 Amenorrhea, unspecified: Secondary | ICD-10-CM

## 2011-07-31 DIAGNOSIS — O30209 Quadruplet pregnancy, unspecified number of placenta and unspecified number of amniotic sacs, unspecified trimester: Secondary | ICD-10-CM

## 2011-07-31 DIAGNOSIS — O3091 Multiple gestation, unspecified, first trimester: Secondary | ICD-10-CM

## 2011-07-31 DIAGNOSIS — O3090 Multiple gestation, unspecified, unspecified trimester: Secondary | ICD-10-CM

## 2011-07-31 LAB — US OB TRANSVAGINAL

## 2011-07-31 LAB — US OB COMP ADDL GEST LESS 14 WKS

## 2011-07-31 LAB — US OB COMP LESS 14 WKS

## 2011-07-31 NOTE — Telephone Encounter (Signed)
Tell patient that I spoke with Dr. Billy Coast at [facility].  Make an appointment for her to see him for new OB appt in 2-3 weeks. When you call Dr. Jorene Minors office alert them that she has triplets and that I have already spoken to Dr. Billy Coast in reference to this.

## 2011-07-31 NOTE — Progress Notes (Signed)
Patient and husband present for follow up ultrasound. 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 Ultrasound today shows 3 viable embryos. There appears to be lack of growth with no cardiac activity in one of the twin embryos. At this point it appears she has 3 viable embryos. We'll plan follow up ultrasound in one week just to confirm the viable number. I again reviewed with the patient and her husband the options for selective reduction with triplets and they both state that they would not consider this and declined referral for consultation. I encouraged them that if they ever change their minds or just have questions that we can make an appointment for them to discuss this with a physician at [facility] or [facility]. We will also make arrangements for them to establish obstetrical care early on and tentatively plan in 2-3 weeks arranging their first visit after ultrasound next week for viability number determination.

## 2011-07-31 NOTE — Patient Instructions (Signed)
Follow up in one week for ultrasound

## 2011-08-03 NOTE — Telephone Encounter (Signed)
I spoke to Dr Silvestre Mesi office. They check pregnancy benefits and then call the patient to set up for appt. The patient was advised this and to call me in a week if still hasnt heard from his office.

## 2011-08-09 ENCOUNTER — Telehealth: Payer: Self-pay | Admitting: *Deleted

## 2011-08-09 NOTE — Telephone Encounter (Signed)
She can pick it up tomorrow.

## 2011-08-09 NOTE — Telephone Encounter (Signed)
Ok for me to fax it to the doctors office today? Her appt is tomorrow before she comes here. Pls advise KW

## 2011-08-09 NOTE — Telephone Encounter (Signed)
Yes I.  see no problems please send note and I will sign it thank you

## 2011-08-09 NOTE — Telephone Encounter (Signed)
Pt called to ask for a note for an OK for laser hair removal on her lower legs. She is a Dr Audie Box patient who is early pregnant and you are seeing tomorrow for an Ultrasound. Ok for  A note?

## 2011-08-09 NOTE — Telephone Encounter (Signed)
Per Dr Lily Peer ok for a note to be faxed. Done. KW

## 2011-08-10 ENCOUNTER — Ambulatory Visit (INDEPENDENT_AMBULATORY_CARE_PROVIDER_SITE_OTHER): Payer: BC Managed Care – PPO

## 2011-08-10 ENCOUNTER — Encounter: Payer: Self-pay | Admitting: Gynecology

## 2011-08-10 ENCOUNTER — Other Ambulatory Visit: Payer: Self-pay | Admitting: Gynecology

## 2011-08-10 ENCOUNTER — Ambulatory Visit (INDEPENDENT_AMBULATORY_CARE_PROVIDER_SITE_OTHER): Payer: BC Managed Care – PPO | Admitting: Gynecology

## 2011-08-10 VITALS — BP 130/70

## 2011-08-10 DIAGNOSIS — N912 Amenorrhea, unspecified: Secondary | ICD-10-CM

## 2011-08-10 DIAGNOSIS — O3090 Multiple gestation, unspecified, unspecified trimester: Secondary | ICD-10-CM

## 2011-08-10 DIAGNOSIS — O30109 Triplet pregnancy, unspecified number of placenta and unspecified number of amniotic sacs, unspecified trimester: Secondary | ICD-10-CM

## 2011-08-10 HISTORY — DX: Multiple gestation, unspecified, unspecified trimester: O30.90

## 2011-08-10 LAB — US OB COMP ADDL GEST LESS 14 WKS

## 2011-08-10 LAB — US OB COMP LESS 14 WKS

## 2011-08-10 NOTE — Patient Instructions (Signed)
Follow up and [facility] for prenatal visit. Good luck/

## 2011-08-10 NOTE — Progress Notes (Signed)
Patient presented to the office today for followup ultrasound for fetal viability. Patient with history of quadruple pregnancy after stimulated cycle of 3 follicles. Patient had been seen in the office on May 13 and the ultrasound then demonstrated what appeared to be a lack of growth with no cardiac activity one of the twin embryos. It appeared on ultrasound that she had 3 viable embryos. Today's ultrasound demonstrated 3 viable embryos dated at 8 weeks and 2 days with estimated date of confinement 03/17/2012. Patient and husband had been counseled and offered selective reduction and referral to [facility] or [facility] but they both declined. Arrangements have been made with [facility] where with the coordination of [facility] we'll follow her for her high risk pregnancy. Her first appointment is within the next few weeks. Patient was otherwise doing well and denied any vaginal bleeding or nausea or vomiting and she's currently taking her prenatal vitamins. A copy of the ultrasound report was provided to them to take for their first obstetrical visit. We will forward a copy of our office notes to them as well.

## 2011-08-11 ENCOUNTER — Ambulatory Visit: Payer: BC Managed Care – PPO | Admitting: Gynecology

## 2011-08-11 ENCOUNTER — Other Ambulatory Visit: Payer: BC Managed Care – PPO

## 2011-08-17 LAB — OB RESULTS CONSOLE ABO/RH: RH Type: NEGATIVE

## 2011-08-17 LAB — OB RESULTS CONSOLE HIV ANTIBODY (ROUTINE TESTING): HIV: NONREACTIVE

## 2011-08-17 LAB — OB RESULTS CONSOLE RPR: RPR: NONREACTIVE

## 2011-08-17 LAB — OB RESULTS CONSOLE RUBELLA ANTIBODY, IGM: Rubella: IMMUNE

## 2011-08-18 ENCOUNTER — Ambulatory Visit: Payer: Self-pay | Admitting: Obstetrics and Gynecology

## 2011-10-19 ENCOUNTER — Other Ambulatory Visit: Payer: Self-pay | Admitting: *Deleted

## 2011-10-19 ENCOUNTER — Ambulatory Visit: Payer: BC Managed Care – PPO | Admitting: Pulmonary Disease

## 2011-10-19 NOTE — Telephone Encounter (Signed)
Faxed refill request.  Patient has not had OV in some time.

## 2011-10-20 ENCOUNTER — Other Ambulatory Visit: Payer: Self-pay

## 2011-10-20 ENCOUNTER — Telehealth: Payer: Self-pay

## 2011-10-20 MED ORDER — ALBUTEROL SULFATE HFA 108 (90 BASE) MCG/ACT IN AERS: 2.0000 | INHALATION_SPRAY | RESPIRATORY_TRACT | Status: DC | PRN

## 2011-10-20 NOTE — Telephone Encounter (Signed)
Okay to refill #1 x 0 inhaler Let her know she needs to set up follow up appt or we won't be able to refill anymore

## 2011-10-20 NOTE — Telephone Encounter (Signed)
Patient informed. 

## 2011-10-20 NOTE — Telephone Encounter (Signed)
Patient states she has appointment with pulmonologist on 11/09/2011. She wants to know if you still want her to do a follow up with you.

## 2011-10-20 NOTE — Telephone Encounter (Signed)
Probably not necessary but they shouldn't be calling us for refills when I haven't seen her---they should have sent the request to the pulmonologist

## 2011-11-09 ENCOUNTER — Ambulatory Visit (INDEPENDENT_AMBULATORY_CARE_PROVIDER_SITE_OTHER): Payer: BC Managed Care – PPO | Admitting: Pulmonary Disease

## 2011-11-09 ENCOUNTER — Encounter: Payer: Self-pay | Admitting: Pulmonary Disease

## 2011-11-09 VITALS — BP 122/88 | HR 85 | Temp 97.9°F | Ht 62.0 in | Wt 183.0 lb

## 2011-11-09 DIAGNOSIS — J45909 Unspecified asthma, uncomplicated: Secondary | ICD-10-CM

## 2011-11-09 MED ORDER — ALBUTEROL SULFATE HFA 108 (90 BASE) MCG/ACT IN AERS: 2.0000 | INHALATION_SPRAY | Freq: Three times a day (TID) | RESPIRATORY_TRACT | Status: DC | PRN

## 2011-11-09 MED ORDER — MOMETASONE FURO-FORMOTEROL FUM 100-5 MCG/ACT IN AERO: 2.0000 | INHALATION_SPRAY | Freq: Two times a day (BID) | RESPIRATORY_TRACT | Status: DC

## 2011-11-09 NOTE — Progress Notes (Signed)
  Subjective:    Patient ID: Nicole Owens, female    DOB: [birth year], 31 y.o.   MRN: [id number]  HPI Patient comes in today for followup of her known asthma.  She was started on dulera at the last visit, and noticed when she took this regularly she did not need her albuterol prior to exercise.  She felt that she did very well with this and told the heat and humidity of the summer, and did required her albuterol rescue occasionally with exercise.  It should also be noted the patient is currently pregnant with triplets.  She has not had any acute exacerbation, significant cough, or congestion.   Review of Systems  Constitutional: Negative for fever and unexpected weight change.  HENT: Positive for congestion and postnasal drip. Negative for ear pain, nosebleeds, sore throat, rhinorrhea, sneezing, trouble swallowing, dental problem and sinus pressure.   Eyes: Negative for redness and itching.  Respiratory: Negative for cough, chest tightness and wheezing.   Cardiovascular: Negative for palpitations and leg swelling.  Gastrointestinal: Negative for nausea and vomiting.  Genitourinary: Negative for dysuria.  Musculoskeletal: Negative for joint swelling.  Skin: Negative for rash.  Neurological: Negative for headaches.  Hematological: Does not bruise/bleed easily.  Psychiatric/Behavioral: Negative for dysphoric mood. The patient is not nervous/anxious.   All other systems reviewed and are negative.       Objective:   Physical Exam Well-developed female in no acute distress Nose without purulence or discharge noted Chest totally clear to auscultation, no wheezing Cardiac exam with regular rate and rhythm Lower extremities with minimal edema, no cyanosis Alert and oriented, moves all 4 extremities.       Assessment & Plan:

## 2011-11-09 NOTE — Patient Instructions (Addendum)
Continue on dulera on a regular basis twice a day. Minimize albuterol use as much as possible.  Let me know if you need consistently more than twice a week. followup with me in one year, or sooner if having issues.

## 2011-11-09 NOTE — Addendum Note (Signed)
Addended by: Nita Sells on: 11/09/2011 10:27 AM   Modules accepted: Orders

## 2011-11-09 NOTE — Assessment & Plan Note (Signed)
The patient did notice improvement in her asthma symptoms with consistent use of dulera.  She most recently has decreased to once a day because of nausea in the mornings, but I have asked her to try to get back on this twice a day regularly.  I have explained to her that asthma often worsens as pregnancy progresses, and can even worsen further during labor.  I have stressed to her the importance of staying on her medication twice a day religiously.  She is to call me if she is requiring her albuterol more than 2 times a week on a consistent basis.

## 2011-12-21 ENCOUNTER — Encounter: Payer: Self-pay | Admitting: Pulmonary Disease

## 2012-01-10 ENCOUNTER — Telehealth: Payer: Self-pay | Admitting: Pulmonary Disease

## 2012-01-10 NOTE — Telephone Encounter (Signed)
1 sample given to the pt- ok per [facility] Nothing further needed

## 2012-01-19 ENCOUNTER — Encounter: Payer: Self-pay | Admitting: Gynecology

## 2012-02-05 ENCOUNTER — Encounter (HOSPITAL_COMMUNITY): Payer: Self-pay | Admitting: Pharmacist

## 2012-02-07 ENCOUNTER — Encounter (HOSPITAL_COMMUNITY): Payer: Self-pay

## 2012-02-07 ENCOUNTER — Other Ambulatory Visit: Payer: Self-pay | Admitting: Obstetrics and Gynecology

## 2012-02-08 ENCOUNTER — Encounter (HOSPITAL_COMMUNITY): Payer: Self-pay

## 2012-02-08 ENCOUNTER — Encounter (HOSPITAL_COMMUNITY)
Admission: RE | Admit: 2012-02-08 | Discharge: 2012-02-08 | Disposition: A | Discharge status: Home / Self Care | Payer: BC Managed Care – PPO | Source: Ambulatory Visit | Attending: Obstetrics and Gynecology | Admitting: Obstetrics and Gynecology

## 2012-02-08 HISTORY — DX: Heartburn: R12

## 2012-02-08 HISTORY — DX: Carpal tunnel syndrome, bilateral upper limbs: G56.03

## 2012-02-08 HISTORY — DX: Other specified pregnancy related conditions, unspecified trimester: O26.899

## 2012-02-08 LAB — CBC
HCT: 39.1 % (ref 36.0–46.0)
Hemoglobin: 14.1 g/dL (ref 12.0–15.0)
MCV: 92.7 fL (ref 78.0–100.0)
WBC: 15.4 10*3/uL — ABNORMAL HIGH (ref 4.0–10.5)

## 2012-02-08 LAB — TYPE AND SCREEN
ABO/RH(D): O NEG
Antibody Screen: POSITIVE
DAT, IgG: NEGATIVE

## 2012-02-08 LAB — SURGICAL PCR SCREEN: Staphylococcus aureus: NEGATIVE

## 2012-02-08 NOTE — Patient Instructions (Addendum)
Your procedure is scheduled on:02/16/12  Enter through the Main Entrance at :0915 Pick up desk phone and dial [phone] and inform us of your arrival.  Please call [phone] if you have any problems the morning of surgery.  Remember: Do not eat after midnight:Thursday  Clear liquids ok until 0630 am Friday Take these meds the morning of surgery with a sip of water:none Use inhaler    DO NOT wear jewelry, eye make-up, lipstick,body lotion, or dark fingernail polish. Do not shave for 48 hours prior to surgery.  If you are to be admitted after surgery, leave suitcase in car until your room has been assigned. Patients discharged on the day of surgery will not be allowed to drive home.

## 2012-02-14 NOTE — Progress Notes (Signed)
Pt re-instructed to come in at 0800 hrs and can have clear fluids until 0400 hrs.

## 2012-02-15 NOTE — Consult Note (Signed)
NAMEGUILLERMINA, STANKE NO.:  [id number]  MEDICAL RECORD NO.:  [id number]  LOCATION:  PERIO                         FACILITY:  [facility]  PHYSICIAN:  Lenoard Aden, M.D.DATE OF BIRTH:  [birth year]  DATE OF CONSULTATION: DATE OF DISCHARGE:                                CONSULTATION   CHIEF COMPLAINT:  Borderline oligohydramnios .  HISTORY OF PRESENT ILLNESS:  A 31 year old white female, G1, P0 Triplet gestation at 35 weeks and 1 day for primary C-section.  Allergies: ASA  She is a nonsmoker, nondrinker.  She denies domestic or physical violence.  MEDICATIONS:  Prenatal vitamins and Ventolin inhaler p.r.n.  FAMILY HISTORY:  Hypertension, anemia and thyroid problems.  She has a primary history of infertility.  PHYSICAL EXAMINATION:  GENERAL:  She is a well-developed, well- nourished, white female, in no acute distress. HEENT:  Normal. NECK:  Supple.  Full range of motion. LUNGS:  Clear. HEART:  Regular rate and rhythm. ABDOMEN:  Soft, gravid, nontender. PELVIC:  Estimated fetal weight each triplet 5 pounds.  Cervix is closed, 60%, vertex, -2. EXTREMITIES:  There are no cords. NEUROLOGIC:  Nonfocal.  Imp: 35 weeks with borderline oligohydramnios triplet B  PLAN:  Proceed with primary low-segment transverse cesarean section. Risks of anesthesia, infection, bleeding, injury to abdominal organs, need for repair discussed delayed versus immediate complications to include bowel, bladder as noted.  Postpartum hemorrhage protocol discussed.  The patient acknowledges and will proceed.    Lenoard Aden, M.D.    RJT/MEDQ  D:  02/15/2012  T:  02/15/2012  Job:  [id number]

## 2012-02-16 ENCOUNTER — Encounter (HOSPITAL_COMMUNITY): Payer: Self-pay | Admitting: *Deleted

## 2012-02-16 ENCOUNTER — Encounter (HOSPITAL_COMMUNITY): Payer: Self-pay | Admitting: Anesthesiology

## 2012-02-16 ENCOUNTER — Inpatient Hospital Stay (HOSPITAL_COMMUNITY): Payer: BC Managed Care – PPO | Admitting: Anesthesiology

## 2012-02-16 ENCOUNTER — Encounter (HOSPITAL_COMMUNITY)
Admission: RE | Disposition: A | Discharge status: Home / Self Care | Payer: Self-pay | Source: Ambulatory Visit | Attending: Obstetrics and Gynecology

## 2012-02-16 ENCOUNTER — Inpatient Hospital Stay (HOSPITAL_COMMUNITY)
Admission: RE | Admit: 2012-02-16 | Discharge: 2012-02-21 | DRG: 371 | Disposition: A | Discharge status: Home / Self Care | Payer: BC Managed Care – PPO | Source: Ambulatory Visit | Attending: Obstetrics and Gynecology | Admitting: Obstetrics and Gynecology

## 2012-02-16 DIAGNOSIS — O4100X0 Oligohydramnios, unspecified trimester, not applicable or unspecified: Principal | ICD-10-CM | POA: Diagnosis present

## 2012-02-16 DIAGNOSIS — Z98891 History of uterine scar from previous surgery: Secondary | ICD-10-CM

## 2012-02-16 DIAGNOSIS — O30109 Triplet pregnancy, unspecified number of placenta and unspecified number of amniotic sacs, unspecified trimester: Secondary | ICD-10-CM

## 2012-02-16 DIAGNOSIS — O3090 Multiple gestation, unspecified, unspecified trimester: Secondary | ICD-10-CM | POA: Diagnosis present

## 2012-02-16 DIAGNOSIS — Z6791 Unspecified blood type, Rh negative: Secondary | ICD-10-CM | POA: Diagnosis present

## 2012-02-16 HISTORY — DX: Multiple gestation, unspecified, unspecified trimester: O30.90

## 2012-02-16 HISTORY — DX: Unspecified blood type, rh negative: Z67.91

## 2012-02-16 HISTORY — DX: History of uterine scar from previous surgery: Z98.891

## 2012-02-16 HISTORY — DX: Other specified pregnancy related conditions, unspecified trimester: O26.899

## 2012-02-16 LAB — CBC
HCT: 39.1 % (ref 36.0–46.0)
MCV: 93.3 fL (ref 78.0–100.0)
RBC: 4.19 MIL/uL (ref 3.87–5.11)
WBC: 9.1 10*3/uL (ref 4.0–10.5)

## 2012-02-16 SURGERY — Surgical Case
Anesthesia: Spinal | Site: Abdomen | Wound class: Clean Contaminated

## 2012-02-16 MED ORDER — ZOLPIDEM TARTRATE 5 MG PO TABS: 5.0000 mg | ORAL_TABLET | Freq: Every evening | ORAL | Status: DC | PRN

## 2012-02-16 MED ORDER — METHYLERGONOVINE MALEATE 0.2 MG/ML IJ SOLN: 0.2000 mg | INTRAMUSCULAR | Status: DC | PRN

## 2012-02-16 MED ORDER — MENTHOL 3 MG MT LOZG: 1.0000 | LOZENGE | OROMUCOSAL | Status: DC | PRN

## 2012-02-16 MED ORDER — NALOXONE HCL 1 MG/ML IJ SOLN
1.0000 ug/kg/h | INTRAMUSCULAR | Status: DC | PRN
  Filled 2012-02-16: qty 2

## 2012-02-16 MED ORDER — ONDANSETRON HCL 4 MG PO TABS: 4.0000 mg | ORAL_TABLET | ORAL | Status: DC | PRN

## 2012-02-16 MED ORDER — LACTATED RINGERS IV SOLN
INTRAVENOUS | Status: DC
  Administered 2012-02-16: 09:00:00 via INTRAVENOUS

## 2012-02-16 MED ORDER — DIPHENHYDRAMINE HCL 25 MG PO CAPS: 25.0000 mg | ORAL_CAPSULE | Freq: Four times a day (QID) | ORAL | Status: DC | PRN

## 2012-02-16 MED ORDER — SIMETHICONE 80 MG PO CHEW
80.0000 mg | CHEWABLE_TABLET | Freq: Three times a day (TID) | ORAL | Status: DC
  Administered 2012-02-16 – 2012-02-21 (×15): 80 mg via ORAL

## 2012-02-16 MED ORDER — DIPHENHYDRAMINE HCL 25 MG PO CAPS
25.0000 mg | ORAL_CAPSULE | ORAL | Status: DC | PRN
Start: 2012-02-16 — End: 2012-02-19
  Filled 2012-02-16: qty 1

## 2012-02-16 MED ORDER — SIMETHICONE 80 MG PO CHEW
80.0000 mg | CHEWABLE_TABLET | ORAL | Status: DC | PRN
  Administered 2012-02-21: 80 mg via ORAL

## 2012-02-16 MED ORDER — CEFAZOLIN SODIUM-DEXTROSE 2-3 GM-% IV SOLR: 2.0000 g | INTRAVENOUS | Status: DC

## 2012-02-16 MED ORDER — ALBUTEROL SULFATE HFA 108 (90 BASE) MCG/ACT IN AERS
2.0000 | INHALATION_SPRAY | Freq: Three times a day (TID) | RESPIRATORY_TRACT | Status: DC | PRN
  Administered 2012-02-20: 2 via RESPIRATORY_TRACT
  Filled 2012-02-16: qty 6.7

## 2012-02-16 MED ORDER — MORPHINE SULFATE (PF) 0.5 MG/ML IJ SOLN
INTRAMUSCULAR | Status: DC | PRN
  Administered 2012-02-16: .15 mg via EPIDURAL

## 2012-02-16 MED ORDER — FENTANYL CITRATE 0.05 MG/ML IJ SOLN
INTRAMUSCULAR | Status: AC
  Filled 2012-02-16: qty 2

## 2012-02-16 MED ORDER — SENNOSIDES-DOCUSATE SODIUM 8.6-50 MG PO TABS
2.0000 | ORAL_TABLET | Freq: Every day | ORAL | Status: DC
  Administered 2012-02-16 – 2012-02-20 (×5): 2 via ORAL

## 2012-02-16 MED ORDER — FENTANYL CITRATE 0.05 MG/ML IJ SOLN
INTRAMUSCULAR | Status: DC | PRN
  Administered 2012-02-16: 25 ug via INTRAVENOUS

## 2012-02-16 MED ORDER — PHENYLEPHRINE HCL 10 MG/ML IJ SOLN
INTRAMUSCULAR | Status: DC | PRN
  Administered 2012-02-16: 80 ug via INTRAVENOUS

## 2012-02-16 MED ORDER — PRENATAL MULTIVITAMIN CH
1.0000 | ORAL_TABLET | Freq: Every day | ORAL | Status: DC
  Administered 2012-02-16 – 2012-02-20 (×5): 1 via ORAL
  Filled 2012-02-16 (×5): qty 1

## 2012-02-16 MED ORDER — ONDANSETRON HCL 4 MG/2ML IJ SOLN: 4.0000 mg | Freq: Three times a day (TID) | INTRAMUSCULAR | Status: DC | PRN

## 2012-02-16 MED ORDER — LACTATED RINGERS IV SOLN
INTRAVENOUS | Status: DC | PRN
  Administered 2012-02-16 (×3): via INTRAVENOUS

## 2012-02-16 MED ORDER — TETANUS-DIPHTH-ACELL PERTUSSIS 5-2.5-18.5 LF-MCG/0.5 IM SUSP: 0.5000 mL | Freq: Once | INTRAMUSCULAR | Status: DC

## 2012-02-16 MED ORDER — WITCH HAZEL-GLYCERIN EX PADS: 1.0000 "application " | MEDICATED_PAD | CUTANEOUS | Status: DC | PRN

## 2012-02-16 MED ORDER — BUPIVACAINE HCL (PF) 0.25 % IJ SOLN
INTRAMUSCULAR | Status: AC
  Filled 2012-02-16: qty 30

## 2012-02-16 MED ORDER — NALOXONE HCL 0.4 MG/ML IJ SOLN: 0.4000 mg | INTRAMUSCULAR | Status: DC | PRN

## 2012-02-16 MED ORDER — METHYLERGONOVINE MALEATE 0.2 MG PO TABS: 0.2000 mg | ORAL_TABLET | ORAL | Status: DC | PRN

## 2012-02-16 MED ORDER — METHYLERGONOVINE MALEATE 0.2 MG/ML IJ SOLN
INTRAMUSCULAR | Status: DC | PRN
  Administered 2012-02-16: 0.2 mg via INTRAMUSCULAR

## 2012-02-16 MED ORDER — OXYCODONE-ACETAMINOPHEN 5-325 MG PO TABS
1.0000 | ORAL_TABLET | ORAL | Status: DC | PRN
  Administered 2012-02-17 (×2): 1 via ORAL
  Filled 2012-02-16 (×2): qty 1

## 2012-02-16 MED ORDER — IBUPROFEN 600 MG PO TABS: 600.0000 mg | ORAL_TABLET | Freq: Four times a day (QID) | ORAL | Status: DC

## 2012-02-16 MED ORDER — SODIUM CHLORIDE 0.9 % IJ SOLN: 3.0000 mL | INTRAMUSCULAR | Status: DC | PRN

## 2012-02-16 MED ORDER — MOMETASONE FURO-FORMOTEROL FUM 100-5 MCG/ACT IN AERO
2.0000 | INHALATION_SPRAY | Freq: Two times a day (BID) | RESPIRATORY_TRACT | Status: DC
  Administered 2012-02-17 – 2012-02-21 (×10): 2 via RESPIRATORY_TRACT
  Filled 2012-02-16: qty 8.8

## 2012-02-16 MED ORDER — MORPHINE SULFATE 0.5 MG/ML IJ SOLN
INTRAMUSCULAR | Status: AC
  Filled 2012-02-16: qty 10

## 2012-02-16 MED ORDER — CEFAZOLIN SODIUM-DEXTROSE 2-3 GM-% IV SOLR
INTRAVENOUS | Status: DC | PRN
  Administered 2012-02-16: 2 g via INTRAVENOUS

## 2012-02-16 MED ORDER — DIPHENHYDRAMINE HCL 50 MG/ML IJ SOLN: 25.0000 mg | INTRAMUSCULAR | Status: DC | PRN

## 2012-02-16 MED ORDER — MISOPROSTOL 200 MCG PO TABS
ORAL_TABLET | ORAL | Status: AC
  Filled 2012-02-16: qty 4

## 2012-02-16 MED ORDER — OXYTOCIN 40 UNITS IN LACTATED RINGERS INFUSION - SIMPLE MED: 62.5000 mL/h | INTRAVENOUS | Status: AC

## 2012-02-16 MED ORDER — DIBUCAINE 1 % RE OINT
1.0000 "application " | TOPICAL_OINTMENT | RECTAL | Status: DC | PRN
  Administered 2012-02-18: 1 via RECTAL
  Filled 2012-02-16: qty 28

## 2012-02-16 MED ORDER — DIPHENHYDRAMINE HCL 50 MG/ML IJ SOLN: 12.5000 mg | INTRAMUSCULAR | Status: DC | PRN

## 2012-02-16 MED ORDER — SCOPOLAMINE 1 MG/3DAYS TD PT72
MEDICATED_PATCH | TRANSDERMAL | Status: AC
  Administered 2012-02-16: 1.5 mg via TRANSDERMAL
  Filled 2012-02-16: qty 1

## 2012-02-16 MED ORDER — NALBUPHINE HCL 10 MG/ML IJ SOLN
5.0000 mg | INTRAMUSCULAR | Status: DC | PRN
  Filled 2012-02-16: qty 1

## 2012-02-16 MED ORDER — OXYTOCIN 10 UNIT/ML IJ SOLN
40.0000 [IU] | INTRAVENOUS | Status: DC | PRN
  Administered 2012-02-16: 40 [IU] via INTRAVENOUS

## 2012-02-16 MED ORDER — LACTATED RINGERS IV SOLN
Freq: Once | INTRAVENOUS | Status: AC
  Administered 2012-02-16: 1000 mL/h via INTRAVENOUS

## 2012-02-16 MED ORDER — OXYTOCIN 10 UNIT/ML IJ SOLN
INTRAMUSCULAR | Status: AC
  Filled 2012-02-16: qty 4

## 2012-02-16 MED ORDER — ONDANSETRON HCL 4 MG/2ML IJ SOLN
INTRAMUSCULAR | Status: DC | PRN
  Administered 2012-02-16: 4 mg via INTRAVENOUS

## 2012-02-16 MED ORDER — ONDANSETRON HCL 4 MG/2ML IJ SOLN
INTRAMUSCULAR | Status: AC
  Filled 2012-02-16: qty 2

## 2012-02-16 MED ORDER — LACTATED RINGERS IV SOLN
INTRAVENOUS | Status: DC
  Administered 2012-02-17: 03:00:00 via INTRAVENOUS

## 2012-02-16 MED ORDER — ONDANSETRON HCL 4 MG/2ML IJ SOLN
4.0000 mg | INTRAMUSCULAR | Status: DC | PRN
  Administered 2012-02-16: 4 mg via INTRAVENOUS
  Filled 2012-02-16: qty 2

## 2012-02-16 MED ORDER — MEPERIDINE HCL 25 MG/ML IJ SOLN: 6.2500 mg | INTRAMUSCULAR | Status: DC | PRN

## 2012-02-16 MED ORDER — SCOPOLAMINE 1 MG/3DAYS TD PT72
1.0000 | MEDICATED_PATCH | Freq: Once | TRANSDERMAL | Status: DC
Start: 2012-02-16 — End: 2012-02-19
  Administered 2012-02-16: 1.5 mg via TRANSDERMAL

## 2012-02-16 MED ORDER — BUPIVACAINE IN DEXTROSE 0.75-8.25 % IT SOLN
INTRATHECAL | Status: DC | PRN
  Administered 2012-02-16: 1.4 mL via INTRATHECAL

## 2012-02-16 MED ORDER — METOCLOPRAMIDE HCL 5 MG/ML IJ SOLN: 10.0000 mg | Freq: Three times a day (TID) | INTRAMUSCULAR | Status: DC | PRN

## 2012-02-16 MED ORDER — CEFAZOLIN SODIUM-DEXTROSE 2-3 GM-% IV SOLR
INTRAVENOUS | Status: AC
  Filled 2012-02-16: qty 50

## 2012-02-16 MED ORDER — LACTATED RINGERS IV SOLN
INTRAVENOUS | Status: DC | PRN
  Administered 2012-02-16 (×2): via INTRAVENOUS

## 2012-02-16 MED ORDER — FENTANYL CITRATE 0.05 MG/ML IJ SOLN: 25.0000 ug | INTRAMUSCULAR | Status: DC | PRN

## 2012-02-16 MED ORDER — FENTANYL CITRATE 0.05 MG/ML IJ SOLN: INTRAMUSCULAR | Status: DC | PRN

## 2012-02-16 MED ORDER — METHYLERGONOVINE MALEATE 0.2 MG/ML IJ SOLN
INTRAMUSCULAR | Status: AC
  Filled 2012-02-16: qty 1

## 2012-02-16 MED ORDER — LANOLIN HYDROUS EX OINT: 1.0000 "application " | TOPICAL_OINTMENT | CUTANEOUS | Status: DC | PRN

## 2012-02-16 MED ORDER — BUPIVACAINE HCL (PF) 0.25 % IJ SOLN
INTRAMUSCULAR | Status: DC | PRN
  Administered 2012-02-16: 10 mL

## 2012-02-16 SURGICAL SUPPLY — 27 items
CLOTH BEACON ORANGE TIMEOUT ST (SAFETY) ×2 IMPLANT
DRESSING TELFA 8X3 (GAUZE/BANDAGES/DRESSINGS) ×2 IMPLANT
DURAPREP 26ML APPLICATOR (WOUND CARE) ×2 IMPLANT
ELECT REM PT RETURN 9FT ADLT (ELECTROSURGICAL) ×2
ELECTRODE REM PT RTRN 9FT ADLT (ELECTROSURGICAL) ×1 IMPLANT
GAUZE SPONGE 4X4 12PLY STRL LF (GAUZE/BANDAGES/DRESSINGS) ×4 IMPLANT
GLOVE BIO SURGEON STRL SZ7.5 (GLOVE) ×4 IMPLANT
GOWN PREVENTION PLUS LG XLONG (DISPOSABLE) ×2 IMPLANT
GOWN PREVENTION PLUS XLARGE (GOWN DISPOSABLE) ×2 IMPLANT
GOWN STRL REIN XL XLG (GOWN DISPOSABLE) ×2 IMPLANT
NEEDLE HYPO 25X1 1.5 SAFETY (NEEDLE) ×2 IMPLANT
NS IRRIG 1000ML POUR BTL (IV SOLUTION) ×2 IMPLANT
PACK C SECTION WH (CUSTOM PROCEDURE TRAY) ×2 IMPLANT
PAD ABD 7.5X8 STRL (GAUZE/BANDAGES/DRESSINGS) ×2 IMPLANT
PAD OB MATERNITY 4.3X12.25 (PERSONAL CARE ITEMS) ×2 IMPLANT
SLEEVE SCD COMPRESS KNEE MED (MISCELLANEOUS) ×2 IMPLANT
STAPLER VISISTAT 35W (STAPLE) ×2 IMPLANT
SUT MNCRL 0 VIOLET CTX 36 (SUTURE) ×3 IMPLANT
SUT MON AB 2-0 CT1 27 (SUTURE) ×2 IMPLANT
SUT MON AB-0 CT1 36 (SUTURE) ×4 IMPLANT
SUT MONOCRYL 0 CTX 36 (SUTURE) ×3
SUT PLAIN 2 0 XLH (SUTURE) IMPLANT
SYR CONTROL 10ML LL (SYRINGE) ×2 IMPLANT
TAPE CLOTH SURG 4X10 WHT LF (GAUZE/BANDAGES/DRESSINGS) ×2 IMPLANT
TOWEL OR 17X24 6PK STRL BLUE (TOWEL DISPOSABLE) ×4 IMPLANT
TRAY FOLEY CATH 14FR (SET/KITS/TRAYS/PACK) ×2 IMPLANT
WATER STERILE IRR 1000ML POUR (IV SOLUTION) ×2 IMPLANT

## 2012-02-16 NOTE — Anesthesia Procedure Notes (Signed)
Spinal  Patient location during procedure: OR Start time: 02/16/2012 11:34 AM Staffing Anesthesiologist: Elbony Mcclimans A. Performed by: anesthesiologist  Preanesthetic Checklist Completed: patient identified, site marked, surgical consent, pre-op evaluation, timeout performed, IV checked, risks and benefits discussed and monitors and equipment checked Spinal Block Patient position: sitting Prep: site prepped and draped and DuraPrep Patient monitoring: heart rate, cardiac monitor, continuous pulse ox and blood pressure Approach: midline Location: L3-4 Injection technique: single-shot Needle Needle type: Sprotte  Needle gauge: 24 G Needle length: 9 cm Needle insertion depth: 6 cm Assessment Sensory level: T4 Events: paresthesia Additional Notes Patient tolerated procedure well. Adequate sensory level. Transient paresthesia right leg.

## 2012-02-16 NOTE — Transfer of Care (Signed)
Immediate Anesthesia Transfer of Care Note  Patient: Nicole Owens  Procedure(s) Performed: Procedure(s) (LRB) with comments: CESAREAN SECTION (N/A)  Patient Location: PACU  Anesthesia Type:Spinal  Level of Consciousness: awake, alert  and oriented  Airway & Oxygen Therapy: Patient Spontanous Breathing  Post-op Assessment: Report given to PACU RN  Post vital signs: Reviewed and stable  Complications: No apparent anesthesia complications

## 2012-02-16 NOTE — Anesthesia Postprocedure Evaluation (Signed)
  Anesthesia Post-op Note  Patient: Nicole Owens  Procedure(s) Performed: Procedure(s) (LRB) with comments: CESAREAN SECTION (N/A)  Patient Location: PACU  Anesthesia Type:Spinal  Level of Consciousness: awake, alert  and oriented  Airway and Oxygen Therapy: Patient Spontanous Breathing  Post-op Pain: none  Post-op Assessment: Post-op Vital signs reviewed, Patient's Cardiovascular Status Stable, Respiratory Function Stable, Patent Airway, Pain level controlled, No headache, No backache, No residual numbness and No residual motor weakness  Post-op Vital Signs: Reviewed and stable  Complications: No apparent anesthesia complications

## 2012-02-16 NOTE — Op Note (Signed)
Cesarean Section Procedure Note  Indications: triplets  Pre-operative Diagnosis: 35 week 1 day pregnancy.  Post-operative Diagnosis: same  Surgeon: Lenoard Aden   Assistants: Ernestina Penna, MD  Anesthesia: Local anesthesia 0.25.% bupivacaine and Spinal anesthesia  ASA Class: 2  Procedure Details  The patient was seen in the Holding Room. The risks, benefits, complications, treatment options, and expected outcomes were discussed with the patient.  The patient concurred with the proposed plan, giving informed consent. The risks of anesthesia, infection, bleeding and possible injury to other organs discussed. Injury to bowel, bladder, or ureter with possible need for repair discussed. Possible need for transfusion with secondary risks of hepatitis or HIV acquisition discussed. Post operative complications to include but not limited to DVT, PE and Pneumonia noted. The site of surgery properly noted/marked. The patient was taken to Operating Room # 9, identified as Ammie Warrick and the procedure verified as C-Section Delivery. A Time Out was held and the above information confirmed.  After induction of anesthesia, the patient was draped and prepped in the usual sterile manner. A Pfannenstiel incision was made and carried down through the subcutaneous tissue to the fascia. Fascial incision was made and extended transversely using Mayo scissors. The fascia was separated from the underlying rectus tissue superiorly and inferiorly. The peritoneum was identified and entered. Peritoneal incision was extended longitudinally. The utero-vesical peritoneal reflection was incised transversely and the bladder flap was bluntly freed from the lower uterine segment. A low transverse uterine incision(Kerr hysterotomy) was made. Delivered from vtx presentation was a  Female A with Apgar scores of 8 at one minute and 9 at five minutes. Bulb suctioning gently performed. Delivered from vtx presentation was a  female B with  Apgar scores of 8 at one minute and 9 at five minutes. Delivered from vtx(Internal version) presentation was a  Female C with Apgar scores of 8 at one minute and 9 at five minutes. Bulb suctioning gently performed.Bulb suctioning gently performed.Neonatal team in attendance.After the umbilical cords were clamped and cut cord blood was obtained for evaluation. The placentas were removed intact and appeared normal. The uterus was curetted with a dry lap pack. Good hemostasis was noted.The uterine outline, tubes and ovaries appeared normal. The uterine incision was closed with running locked sutures of 0 Monocryl x 2 layers. Hemostasis was observed. Lavage was carried out until clear.The parietal peritoneum was closed with a running 2-0 Monocryl suture. The fascia was then reapproximated with running sutures of 0 Monocryl. The skin was reapproximated with staples.  Instrument, sponge, and needle counts were correct prior the abdominal closure and at the conclusion of the case.   Findings: above  Estimated Blood Loss:  500         Drains: foley                 Specimens: placentas to path                 Complications:  None; patient tolerated the procedure well.         Disposition: PACU - hemodynamically stable.         Condition: stable  Attending Attestation: I performed the procedure.

## 2012-02-16 NOTE — Anesthesia Preprocedure Evaluation (Signed)
Anesthesia Evaluation  Patient identified by MRN, date of birth, ID band Patient awake    Reviewed: Allergy & Precautions, H&P , NPO status , Patient's Chart, lab work & pertinent test results  Airway Mallampati: III TM Distance: >3 FB Neck ROM: Full    Dental No notable dental hx. (+) Teeth Intact   Pulmonary asthma ,  breath sounds clear to auscultation  Pulmonary exam normal       Cardiovascular negative cardio ROS  Rhythm:Regular Rate:Normal     Neuro/Psych  Neuromuscular disease negative psych ROS   GI/Hepatic Neg liver ROS, GERD-  Medicated and Controlled,  Endo/Other  negative endocrine ROS  Renal/GU negative Renal ROS  negative genitourinary   Musculoskeletal negative musculoskeletal ROS (+)   Abdominal (+) + obese,   Peds  Hematology negative hematology ROS (+)   Anesthesia Other Findings   Reproductive/Obstetrics Hx/o Infertility Multiple gestation- Triplets                           Anesthesia Physical Anesthesia Plan  ASA: II  Anesthesia Plan: Spinal   Post-op Pain Management:    Induction:   Airway Management Planned: Natural Airway  Additional Equipment:   Intra-op Plan:   Post-operative Plan: Extubation in OR  Informed Consent:   Dental advisory given  Plan Discussed with: CRNA, Anesthesiologist and Surgeon  Anesthesia Plan Comments:         Anesthesia Quick Evaluation

## 2012-02-16 NOTE — Progress Notes (Signed)
Patient ID: Nicole Owens, female   DOB: June 21, 1980, 31 y.o.   MRN: [id number] Patient seen and examined. Consent witnessed and signed. No changes noted. Update completed.

## 2012-02-17 ENCOUNTER — Encounter (HOSPITAL_COMMUNITY): Payer: Self-pay

## 2012-02-17 DIAGNOSIS — O26899 Other specified pregnancy related conditions, unspecified trimester: Secondary | ICD-10-CM

## 2012-02-17 DIAGNOSIS — Z98891 History of uterine scar from previous surgery: Secondary | ICD-10-CM

## 2012-02-17 DIAGNOSIS — Z6791 Unspecified blood type, Rh negative: Secondary | ICD-10-CM | POA: Diagnosis present

## 2012-02-17 HISTORY — DX: Other specified pregnancy related conditions, unspecified trimester: O26.899

## 2012-02-17 HISTORY — DX: History of uterine scar from previous surgery: Z98.891

## 2012-02-17 HISTORY — DX: Unspecified blood type, rh negative: Z67.91

## 2012-02-17 LAB — CBC
HCT: 30.8 % — ABNORMAL LOW (ref 36.0–46.0)
Hemoglobin: 10.8 g/dL — ABNORMAL LOW (ref 12.0–15.0)
MCH: 32.6 pg (ref 26.0–34.0)
RBC: 3.31 MIL/uL — ABNORMAL LOW (ref 3.87–5.11)

## 2012-02-17 MED ORDER — OXYCODONE-ACETAMINOPHEN 5-325 MG PO TABS
1.0000 | ORAL_TABLET | ORAL | Status: DC | PRN
  Administered 2012-02-17 – 2012-02-18 (×6): 2 via ORAL
  Administered 2012-02-18: 1 via ORAL
  Administered 2012-02-18 – 2012-02-20 (×7): 2 via ORAL
  Administered 2012-02-20 – 2012-02-21 (×4): 1 via ORAL
  Filled 2012-02-17 (×4): qty 1
  Filled 2012-02-17: qty 2
  Filled 2012-02-17: qty 1
  Filled 2012-02-17: qty 2
  Filled 2012-02-17: qty 1
  Filled 2012-02-17 (×5): qty 2
  Filled 2012-02-17 (×2): qty 1
  Filled 2012-02-17: qty 2
  Filled 2012-02-17: qty 1
  Filled 2012-02-17 (×2): qty 2

## 2012-02-17 MED ORDER — OXYCODONE-ACETAMINOPHEN 5-325 MG PO TABS
1.0000 | ORAL_TABLET | ORAL | Status: DC | PRN
  Filled 2012-02-17: qty 2

## 2012-02-17 NOTE — Progress Notes (Signed)
Subjective: POD# 1 Information for the patient's newborn:  Nicole Owens, Nicole Owens [[id number]]  female Information for the patient's newborn:  Nicole Owens, Nicole Owens [[id number]]  female Information for the patient's newborn:  Nicole Owens, Nicole Owens [[id number]]  female Baby boys in room with mom, baby girl in NICU / prematurity  Reports feeling well, in good spirits. Feeding: breast Patient reports tolerating PO.  Breast symptoms: colostrum present, LC support for triplets breastfeeding Pain controlled with Motrin and Percocet Denies HA/SOB/C/P/N/V/dizziness. Flatus absent. She reports vaginal bleeding as normal, without clots.  She is ambulating, urinating without difficult.     Objective:   VS:  Filed Vitals:   02/16/12 2000 02/16/12 2200 02/17/12 0000 02/17/12 0200  BP: 153/93 121/77 120/72 124/75  Pulse: 71 83 82 84  Temp: 97.9 F (36.6 C) 97.9 F (36.6 C) 97.9 F (36.6 C) 97.4 F (36.3 C)  TempSrc: Axillary Axillary Axillary Axillary  Resp: 20 20 20 20   Height:      Weight:      SpO2: 96% 96% 97% 95%     Intake/Output Summary (Last 24 hours) at 02/17/12 0827 Last data filed at 02/17/12 0600  Gross per 24 hour  Intake   2760 ml  Output   3425 ml  Net   -665 ml        Basename 02/17/12 0535 02/16/12 0805  WBC 11.5* 9.1  HGB 10.8* 13.8  HCT 30.8* 39.1  PLT 177 206     Blood type: --/--/O NEG (11/29 0805)  Rubella: Immune (05/30 0000)     Physical Exam:  General: alert, cooperative and no distress CV: Regular rate and rhythm Resp: clear Abdomen: soft, nontender, decreased LQ's bowel sounds, moderate gas distention Incision: clean, dry, intact and dressing in place Uterine Fundus: firm, below umbilicus, nontender Lochia: minimal Ext: extremities normal, atraumatic, no cyanosis or edema and no edema, redness or tenderness in the calves or thighs      Assessment/Plan: 31 y.o.   POD# 1. [id number]                  Active Problems:  S/P cesarean section - triplets  11/29  Multiple gestation - triplets  Postpartum care following cesarean delivery  Rh negative, maternal  Doing well, stable.               Advance diet as tolerated Ambulate Routine post-op care Rh status for infants pending, will give Rhogam as needed.  Ulanda Tackett 02/17/2012, 8:27 AM

## 2012-02-17 NOTE — Anesthesia Postprocedure Evaluation (Signed)
  Anesthesia Post-op Note  Patient: Nicole Owens  Procedure(s) Performed: Procedure(s) (LRB) with comments: CESAREAN SECTION (N/A)  Patient Location: Mother/Baby  Anesthesia Type:Spinal  Level of Consciousness: awake, alert  and oriented  Airway and Oxygen Therapy: Patient Spontanous Breathing  Post-op Pain: mild  Post-op Assessment: Patient's Cardiovascular Status Stable, Respiratory Function Stable, Patent Airway, No signs of Nausea or vomiting and Pain level controlled  Post-op Vital Signs: stable  Complications: No apparent anesthesia complications

## 2012-02-17 NOTE — Addendum Note (Signed)
Addendum  created 02/17/12 0844 by Lincoln Brigham, CRNA   Modules edited:Notes Section

## 2012-02-18 MED ORDER — RHO D IMMUNE GLOBULIN 1500 UNIT/2ML IJ SOLN
300.0000 ug | Freq: Once | INTRAMUSCULAR | Status: AC
  Administered 2012-02-18: 300 ug via INTRAMUSCULAR
  Filled 2012-02-18: qty 2

## 2012-02-18 MED ORDER — BISACODYL 10 MG RE SUPP
10.0000 mg | Freq: Every day | RECTAL | Status: DC | PRN
  Administered 2012-02-18: 10 mg via RECTAL
  Filled 2012-02-18 (×2): qty 1

## 2012-02-18 MED ORDER — FLEET ENEMA 7-19 GM/118ML RE ENEM: 1.0000 | ENEMA | Freq: Every day | RECTAL | Status: DC | PRN

## 2012-02-18 NOTE — Progress Notes (Signed)
Subjective: POD# 2 Information for the patient's newborn:  Norine, [place] [[id number]]  female Information for the patient's newborn:  Kenitha, [place] [[id number]]  female Information for the patient's newborn:  Rayan, Dyal [[id number]]  female  / circ x 2 planned today Baby girl stable in NICU - prematurity  Reports feeling tired but well. Mild nausea this AM, no breakfast as of yet. Feeding: breast and bottle Patient reports tolerating PO.  Breast symptoms: colostrum present, pumping and feeding as well as working on latching boys at breast, LC support ongoing.  Pain controlled with Motrin and Percocet.  Denies HA/SOB/C/P/dizziness. Flatus present, BM x 2 small. She reports vaginal bleeding as normal, without clots.  She is ambulating, urinating without difficult.     Objective:   VS:  Filed Vitals:   02/17/12 0200 02/17/12 0954 02/17/12 1400 02/17/12 2122  BP: 124/75 129/75 116/72 132/84  Pulse: 84 87 79 80  Temp: 97.4 F (36.3 C) 98.5 F (36.9 C) 98.3 F (36.8 C) 97.9 F (36.6 C)  TempSrc: Axillary Oral Oral Oral  Resp: 20 16 16 18   Height:      Weight:      SpO2: 95% 95% 95%      Intake/Output Summary (Last 24 hours) at 02/18/12 0849 Last data filed at 02/17/12 1000  Gross per 24 hour  Intake 1287.08 ml  Output   1350 ml  Net -62.92 ml        Basename 02/17/12 0535 02/16/12 0805  WBC 11.5* 9.1  HGB 10.8* 13.8  HCT 30.8* 39.1  PLT 177 206     Blood type: --/--/O NEG (11/30 0535)  Rubella: Immune (05/30 0000)     Physical Exam:  General: alert, cooperative and no distress CV: Regular rate and rhythm Resp: clear Abdomen: soft, nontender, normal bowel sounds, moderate gas distention, + muscle laxity Incision: clean, dry, intact and dressing in place Uterine Fundus: firm, below umbilicus, nontender Lochia: minimal Ext: Homans sign is negative, no sign of DVT and no edema, redness or tenderness in the calves or  thighs      Assessment/Plan: 31 y.o.   POD# 2. [id number]                  Active Problems:  S/P cesarean section - triplets 11/29  Multiple gestation - triplets  Postpartum care following cesarean delivery  Rh negative, maternal  At least one newborn is Rh positive, plan Rhogam prior to DC  Doing well, stable.    Abdominal muscle laxity, will give abdominal binder for ambulation. Ambulate, continue w. Warm fluids, encourage gut motility Continue w; LC support for breastfeeding triplets, poor latch d/t prematurity Plan DC on POD #4 Routine post-op care  PAUL,DANIELA 02/18/2012, 8:49 AM

## 2012-02-19 ENCOUNTER — Encounter (HOSPITAL_COMMUNITY): Payer: Self-pay | Admitting: Obstetrics and Gynecology

## 2012-02-19 LAB — RH IG WORKUP (INCLUDES ABO/RH)
ABO/RH(D): O NEG
Gestational Age(Wks): 35.1

## 2012-02-19 MED ORDER — MAGNESIUM HYDROXIDE 400 MG/5ML PO SUSP
30.0000 mL | Freq: Every day | ORAL | Status: DC | PRN
  Administered 2012-02-19: 30 mL via ORAL
  Filled 2012-02-19: qty 30

## 2012-02-19 NOTE — Progress Notes (Signed)
POSTOPERATIVE DAY # 3 S/P cesarean section for triplets   S:         Reports feeling ok - significant gas pains yesterday until early am today              Tolerating po intake / no nausea / no vomiting / some flatus - more this morning - none last pm                   Concerned no BM today (very small BM yesterday) - took MOM              Milk in this am             Bleeding is light             Pain controlled with motrin and percocet             Up ad lib / ambulatory/ voiding QS  Newborn breast-feeding and bottle-feeding - schedule established for triplets  / Circumcision today on boys             Daughter in NICU - anticipate back to regular nursery care today / possible DC tomorrow of all babies??   O:  VS: BP 128/70  Pulse 88  Temp 98.8 F (37.1 C) (Oral)  Resp 18  Ht 5' 2.5" (1.588 m)  Wt 94.348 kg (208 lb)  BMI 37.44 kg/m2  SpO2 95%  LMP 06/15/2011  Breastfeeding? Unknown   LABS:  Basename 02/17/12 0535  WBC 11.5*  HGB 10.8*  PLT 177                Physical Exam:             Alert and Oriented X3  Lungs: Clear and unlabored  Heart: regular rate and rhythm / no mumurs  Abdomen: soft, non-tender, mildly-distended with hypoactive BS             Fundus: firm, non-tender, U-1             Dressing OFF              Incision:  approximated with staples / no erythema / no ecchymosis / no drainage  Perineum: no edema  Lochia: light  Extremities: trace edema, no calf pain or tenderness  A:        POD # 3 S/P cesarean section of triplets (2 boys & 2 girl)            Ambulation            Increase water intake - ok back to 32oz water bottle - warm fluids and soups today to promote bowel motility            Repeat MOM prn  P:        Routine postoperative care              Anticipate d/c tomorrow - may consider room-in if babies not discharge tomorrow    Marlinda Mike CNM, MSN 02/19/2012, 8:56 AM

## 2012-02-20 LAB — TYPE AND SCREEN: Unit division: 0

## 2012-02-20 NOTE — Progress Notes (Signed)
02/20/12 1200  Clinical Encounter Type  Visited With Patient and family together    Made initial contact with pt and family while pt pumping.  Plan to follow up later in afternoon for support.  Family appreciative to be aware of spiritual care.  [address] [place], [place] [phone]

## 2012-02-20 NOTE — Progress Notes (Signed)
S: Patient had a distended abdomen this morning due to trapped flatus post op D#4 O: Bowel sound present and Rectal flatus tube placed.      Mother has large amount of frequent flatus released via the tube with great relief.     Abdomen now soft and N/T. Patient has B/S x 4. Feels that flatus in building up again and requested the replacement     of Rectal Tube tonight while rest to assist with deflation. A: Marked improved in abdominal status and patient describes comfort.     No Ileus noted. P: Replace Rectal tube as per patient request tonight      Plan for discharge in am.  Earl Gala, CNM.

## 2012-02-20 NOTE — Progress Notes (Signed)
Post op LTCS D 4, Triples (Female, Female, Female) Subjective: no complaints, up ad lib without syncope, voiding, tolerating PO, + flatus, But has "Gas Belly" with distension. The patient is finding this very uncomfortable. Has ambulated, has taken all types of stool softeners and fleets enema.   Pain well controlled with po meds, taking motrin and percocet  BF: breastfeeding with assistance plus supplementing with formula Mood stable, bonding well Baby B (Female) had dropped temperature today. Slow to feed. Had partial circumcision but will need to see Nephrologist. Baby A (Female) feeding well and holding temperature. Baby C (Female) remains in Nursery. (Breast/formula Feeding)   Objective: Blood pressure 110/71, pulse 84, temperature 98.5 F (36.9 C), temperature source Oral, resp. rate 18, height 5' 2.5" (1.588 m), weight 94.348 kg (208 lb), last menstrual period 06/15/2011, SpO2 95.00%, unknown if currently breastfeeding.  Physical Exam:  General: alert, cooperative, fatigued and no distress Breasts; N/T Lungs: CTAB Heart: RRR Abdomen: Distended. N/T but bloated with gas, B/S x 4, Peristalsis, no BM as yet. Mother is concerned. Will try Red rubber rectal flatus tube to deflat abdomen. Incision: Dry and intact. Healing well. Lochia: appropriate, Min Uterine Fundus: firm, +2/u Perineum:intact DVT Evaluation: No evidence of DVT seen on physical exam. Negative Homan's sign. No cords or calf tenderness. No significant calf/ankle edema. Pre-op - Hgb 12.8 Patient has declined CBC check post-op She states that her main problem is Abdominal distention as pain associated with this distention.  Assessment/Plan: Plan for discharge tomorrow. Will re- evaluate later today re flatus. May discharge later this evening or in am.    LOS: 4 days   Javelle Donigan, CNM. 02/20/2012, 9:54 AM

## 2012-02-20 NOTE — Clinical Social Work Psychosocial (Signed)
    Clinical Social Work Department BRIEF PSYCHOSOCIAL ASSESSMENT 02/20/2012  Patient:  Nicole Owens, Nicole Owens     Account Number:  [id number]     Admit date:  02/16/2012  Clinical Social Worker:  Almeta Monas  Date/Time:  02/20/2012 10:00 AM  Referred by:  RN  Date Referred:  02/20/2012 Referred for  Other - See comment   Other Referral:   NICU   Interview type:  Family Other interview type:    PSYCHOSOCIAL DATA Living Status:  FAMILY Admitted from facility:   Level of care:   Primary support name:  Sherian Rein Primary support relationship to patient:  SPOUSE Degree of support available:   Good support system of family and friends    CURRENT CONCERNS Current Concerns  None Noted   Other Concerns:    SOCIAL WORK ASSESSMENT / PLAN CSW met with parents in [facility]'s first floor room/126 to introduce myself, complete assessment and evaluate how they are coping with their daughter's admission to NICU.  CSW explained support services offered by NICU CSW and gave contact information.  CSW has no social concerns at this time.   Assessment/plan status:  Psychosocial Support/Ongoing Assessment of Needs Other assessment/ plan:   Information/referral to community resources:   none needed    PATIENT'S/FAMILY'S RESPONSE TO PLAN OF CARE: Parents were very friendly and appear to be coping well with the situation.  They seem to have a good understanding of the situation and have a great support system.  Family and friends were here with them today.  They report no questions, concerns or needs at this time and thanked CSW for visit.

## 2012-02-21 MED ORDER — SIMETHICONE 80 MG PO CHEW: 80.0000 mg | CHEWABLE_TABLET | Freq: Three times a day (TID) | ORAL | Status: DC

## 2012-02-21 MED ORDER — OXYCODONE-ACETAMINOPHEN 5-325 MG PO TABS: 1.0000 | ORAL_TABLET | ORAL | Status: DC | PRN

## 2012-02-21 NOTE — Discharge Summary (Signed)
Physician Discharge Summary  Patient ID: Nicole Owens MRN: [id number] DOB/AGE: Sep 24, 1980 31 y.o.  Admit date: 02/16/2012 Discharge date: 02/21/2012  Admission Diagnoses: Scheduled PLTCS for Triplets at [redacted]w[redacted]d due to [facility]   Discharge Diagnoses:  Active Problems:  Multiple gestation - triplets  S/P cesarean section - triplets 11/29  Postpartum care following cesarean delivery (11/29)  Rh negative, maternal   Discharged Condition: stable  Hospital Course: Uncomplicated maternal postpartum course. Babies C (female) has been in NICU and nursery for most of hospital stay but will be discharge today with her brothers. Baby B: will need f/u with Nephrology as difficulty with circumcision. Baby A: circumcision completed without complication.  Consults: Collaboration with MFM for management.  Significant Diagnostic Studies: labs: Antenatal routine normal RH neg, microbiology: urine culture: negative and radiology: Ultrasound: had serial Sono surveilnace during the antenatal course until at [redacted]w[redacted]d - Oligohydramnios  Treatments: IV hydration, antibiotics: Ancef, analgesia: acetaminophen w/ codeine and ibuprofen Consult with lactation specialist.  Discharge Exam: Blood pressure 141/84, pulse 97, temperature 99 F (37.2 C), temperature source Oral, resp. rate 18, height 5' 2.5" (1.588 m), weight 94.348 kg (208 lb), last menstrual period 06/15/2011, SpO2 95.00%, unknown if currently breastfeeding. General appearance: alert, cooperative and no distress Affect: AAO x3 Lungs: CTAB CV: RRR Breasts: N/T Abdomen, Soft, N/T and non distended. B/S x 4 and BM x 1 Uterus: at u, firm. Lochia: Min, Rubra GI: BM and flatus GU: no problems voiding Extremities: No swelling, edema bilaterally   Disposition: discharge to home with 3 babies  Discharge Orders    Future Appointments: Provider: Department: Dept Phone: Center:   02/29/2012 1:00 PM Wh-Lc [facility] Consultant [facility]  [place] LACTATION SERVICES [phone] None   02/29/2012 2:30 PM Wh-Lc Lac Consultant [facility] [place] LACT[facility] [phone] None   02/29/2012 4:00 PM Wh-Lc Lac Consultant THE [facility] [place] LACT[facility] SERVICES [phone] None     Future Orders Please Complete By Expires   Diet general      Discharge instructions      Comments:   Per [facility] Booklet       Medication List     As of 02/21/2012 12:27 PM    TAKE these medications         albuterol 108 (90 BASE) MCG/ACT inhaler   Commonly known as: PROVENTIL HFA;VENTOLIN HFA   Inhale 2 puffs into the lungs 3 (three) times daily as needed for wheezing or shortness of breath.      DHA COMPLETE PO   Take by mouth daily.      mometasone-formoterol 100-5 MCG/ACT Aero   Commonly known as: DULERA   Inhale 2 puffs into the lungs 2 (two) times daily.      oxyCODONE-acetaminophen 5-325 MG per tablet   Commonly known as: PERCOCET/ROXICET   Take 1-2 tablets by mouth every 4 (four) hours as needed (moderate - severe pain).      PRENATAL VITAMIN PO   Take by mouth.      simethicone 80 MG chewable tablet   Commonly known as: MYLICON   Chew 1 tablet (80 mg total) by mouth 4 (four) times daily - after meals and at bedtime.           Follow-up Information    Follow up with [facility] In 7 days. (As needed)    Contact information:   [address] [place] [address] [phone]        Uncomplicated  PLTCS, Triplets at  [redacted]w[redacted]d.  Signed: Earl Gala, CNM. 02/21/2012, 12:27 PM

## 2012-02-21 NOTE — Progress Notes (Signed)
Subjective: Postpartum Day 5: Cesarean Delivery delivery of triplets ( A: female, B: Female, C: Female) Patient reports incisional pain, + flatus, + BM and no problems voiding.  +BM no complaints, up ad lib without syncope Pain well controlled with po meds, using motrin and percocet  BF: Pumping at regular intervals and coping well with demand for triplets. Patient is supplementing as needed. Mood stable, bonding well   Objective: Vital signs in last 24 hours: Temp:  [98.7 F (37.1 C)-99.6 F (37.6 C)] 99 F (37.2 C) (12/04 0550) Pulse Rate:  [90-108] 97  (12/04 0550) Resp:  [18-20] 18  (12/04 0550) BP: (136-152)/(77-89) 141/84 mmHg (12/04 0550)  Physical Exam:  General: alert, cooperative and no distress Breasts: N/T Heart: RRR Lungs: CTAB Abdomen:Soft, BS x4 and BM x 1  Uterine Fundus: firm, at u Incision: healing well, no significant drainage, no dehiscence, Slight inflammation at insertion sites of staples. Staples removed today Steri-strips applied to incision site. Note Left edge of incision site skin not completely healed but good alignment with steri-strips Lochia: appropriate DVT Evaluation: No evidence of DVT seen on physical exam. Negative Homan's sign. No cords or calf tenderness. No significant calf/ankle edema.  No results found for this basename: HGB:2,HCT:2 in the last 72 hours  Assessment/Plan: Status post Cesarean section. Doing well postoperatively.  Discharge home with standard precautions and return to clinic in 7 days for post op review. All three babies are being discharged today with mother to home.   Genene Kilman, CNM. 02/21/2012, 12:16 PM

## 2012-02-21 NOTE — Progress Notes (Signed)
  Seen by Earl Gala for Rounds - see her note for progress note today   Marlinda Mike CNM, MSN 02/21/2012, 11:09 AM

## 2012-02-29 ENCOUNTER — Encounter (HOSPITAL_COMMUNITY): Payer: Self-pay

## 2012-02-29 ENCOUNTER — Ambulatory Visit (HOSPITAL_COMMUNITY): Admission: RE | Admit: 2012-02-29 | Payer: BC Managed Care – PPO | Source: Ambulatory Visit

## 2012-02-29 ENCOUNTER — Ambulatory Visit (HOSPITAL_COMMUNITY)
Admission: RE | Admit: 2012-02-29 | Discharge: 2012-02-29 | Disposition: A | Discharge status: Home / Self Care | Payer: BC Managed Care – PPO | Source: Ambulatory Visit | Attending: Obstetrics and Gynecology | Admitting: Obstetrics and Gynecology

## 2012-02-29 ENCOUNTER — Ambulatory Visit (HOSPITAL_COMMUNITY): Payer: BC Managed Care – PPO

## 2012-03-08 NOTE — Progress Notes (Signed)
Adult Lactation Consultation Outpatient Visit Note  Patient Name: Nicole Owens(mother)                      BABIES: Nicole Owens  BIRTH WEIGHT: 4-8  WEIGHT TODAY: 4-15.3 Date of Birth: 23-Feb-1981                                                           Nicole Owens: BIRTH WEIGHT: 4-12 WEIGHT TODAY: 5-1.4 Gestational Age at Delivery: [redacted]w[redacted]d  TRIPLETS                     Nicole Owens: BIRTH WEIGHT: 4-15 WEIGHT TODAY: 5-1.1 Type of Delivery: C/S  Breastfeeding History: Frequency of Breastfeeding: FEEDING EACH BABY AT BREAST ABOUT TWICE PER DAY Length of Feeding: 10 20 MINUTES Voids: QS  Stools: QS  Supplementing / Method:EBM/BOTTLE EVERY 3 HOURS.  ONE FORMULA BOTTLE AT NIGHT  Nicole Owens 45 MLS EACH FEEDING Pumping:                                                                                                                                                          Nicole Owens 55-75 MLS EACH FEEDING  Type of Pump:PUMP IN STYLE                                                                                                           Nicole Owens 55-75 MLS EACH FEEDING   Frequency:EVERY 3 HOURS  Volume:  200-300 MLS EACH PUMPING  Comments:    Consultation Evaluation: Parent's here with 31 day old triplets for feeding evaluation.  Parent's have great family support and have worked into a workable routine allowing them to get some rest. Babies are all latching a couple times per day using a nipple shield.  Parent's report Greig Castilla doing the least effective sucking at breast. All babies taking bottle well and gaining well.  Next pedi appointment is at one month.  LC observed all babies at breast and mom doing very well with technique.  Demonstrated how to get babies a little deeper an use good breast massage and compression for more effective feeds.  Nipple shield changed from a 16 mm  to a 20 mm for [place] and Maisie Fus and Greig Castilla still doing best with a 16 mm.  Plan will be to gradually work in more feedings at breast and practice  tandem feedings which is mom's goal.  Mom will continue to pump 6-8 times per day.  Parent's will gradually increase EBM volume as needed.  Initial Feeding Assessment:Nicole Owens 30 MINUTES Pre-feed ZOXWRU:0454 Post-feed UJWJXB:1478 Amount Transferred:42 MLS Comments:  Additional Feeding Assessment:Nicole Owens 30 MINUTES Pre-feed Weight:2300 Post-feed GNFAOZ:3086 Amount Transferred:28 MLS Comments:  Additional Feeding Assessment: Nicole Owens 30 MINUTES Pre-feed Weight:2308 Post-feed VHQION:6295 Amount Transferred:72 MLS!! Comments:  Total Breast milk Transferred this Visit:  Total Supplement Given:   Additional Interventions:   Follow-Up OUTPATIENT LACTATION APPOINTMENT 03/11/12 1 00PM      Hansel Feinstein 03/08/2012, 9:14 AM

## 2012-03-11 ENCOUNTER — Ambulatory Visit (HOSPITAL_COMMUNITY): Payer: BC Managed Care – PPO

## 2012-03-11 ENCOUNTER — Ambulatory Visit (HOSPITAL_COMMUNITY)
Admission: RE | Admit: 2012-03-11 | Discharge: 2012-03-11 | Disposition: A | Discharge status: Home / Self Care | Payer: BC Managed Care – PPO | Source: Ambulatory Visit | Attending: Obstetrics and Gynecology | Admitting: Obstetrics and Gynecology

## 2012-03-11 NOTE — Progress Notes (Signed)
Adult Lactation Consultation Outpatient Visit Note  Patient Name: Nicole Owens, 03/01/12                                                                       todays date: 12/23                                                                       todays weight:                                                                       Andrew,5-14.1, 2668                                                                       Thomas,6-0.9,2748                                                                        Madison,6.-2.6,2794 Date of Birth: November 11, 1980 Gestational Age at Delivery: Unknown Type of Delivery: C-Section ,Triplets   Breastfeeding History: Frequency of Breastfeeding: every 3 hours Length of Feeding: 25-30 mins Voids: 10-12 Stools: 8 yellow mustard        Supplementing / Method: Pumping:  Type of Pump:Medela   Frequency: after every feeding at least 7 times daily  Volume:  350-400 ml   Comments: Mother states she is breastfeeding infants at least one time in 24 hour period. She is bottle feeding at least 75 ml of expressed breast milk every 3 hours. Mother states that infant receive one bottle of 22 cal Enfacare in 24 hours for night feeding.  Mother complaints of slight tender nipples. Nipples are very pink. She declines itching, stinging or burning.    Consultation Evaluation:  Initial Feeding Assessment: Greig Castilla: infant feed well with good rhythmic suckling. Mother encouraged breast  Compression during feeding. Infant sustained latch for 24 mins. Pre-feed ZOXWRU:0454 Post-feed UJWJXB:1478 Amount Transferred:16ml Comments:  Additional Feeding Assessment:Thomas: sustained latch for 19 mins. Observed good audible swallowing. Pre-feed GNFAOZ:3086 Post-feed VHQION:6295 Amount Transferred:93ml Comments:  Additional Feeding Assessment:Madison: infant latched well with good burst of rhythmic suckling for  20 mins.  Pre-feed  GNFAOZ:3086 Post-feed VHQION:6295 Amount Transferred:39ml Comments:  Total Breast milk Transferred this Visit:  Total Supplement Given:   Additional Interventions: Mother inst to continue to cue base feed infants and at least every 3 hours. inst to supplement with 75 -90ml of EBM / formula every 2-3 hours. inst mother to continue to pump after each feeding for 20 mins.Mother inst to rotate positions and do good breast compression while feeding. Recommend mother to call MD for all purpose nipple cream. Recommend follow up in one week for feeding assessment.  Follow-Up  January 6 at 1:00 and 2:30    Stevan Born [facility] 03/11/2012, 11:54 AM

## 2012-03-25 ENCOUNTER — Ambulatory Visit (HOSPITAL_COMMUNITY): Admission: RE | Admit: 2012-03-25 | Payer: BC Managed Care – PPO | Source: Ambulatory Visit

## 2012-03-25 ENCOUNTER — Ambulatory Visit (HOSPITAL_COMMUNITY)
Admission: RE | Admit: 2012-03-25 | Discharge: 2012-03-25 | Disposition: A | Discharge status: Home / Self Care | Payer: BC Managed Care – PPO | Source: Ambulatory Visit | Attending: Obstetrics and Gynecology | Admitting: Obstetrics and Gynecology

## 2012-03-25 ENCOUNTER — Ambulatory Visit (HOSPITAL_COMMUNITY): Payer: BC Managed Care – PPO

## 2012-03-25 NOTE — Progress Notes (Addendum)
Adult Lactation Consultation Outpatient Visit Note LC consult with Triplets - from 1300- 1545   Patient Name: Nicole Owens Date of Birth: 11-21-1980 Gestational Age at Delivery: Unknown Type of Delivery:  Reason for visit today - F/U with Triplets for feeding assessments - Babies and mom have been to this Lactation office for several visits                                       And assessment of sore nipples and nipple shield   Breastfeeding History: Frequency of Breastfeeding: Per mom pumping every 3 hours around the clock  and latching at the breast only twice and that is not twice every day.  Length of Feeding: 15- 20 mins feedings  Voids: >6 for all three babies  Stools: Baby Madison sometimes every few days ,[employer]- 2-3 times a day ,[employer] 2-3 times a day - yellowish seedy stools  Supplementing / Method: Pumping:  Type of Pump:DEBP Medela    Frequency: Every 3 hours   Volume: left - 100 ml , right - 150 ml at a pumping    Comments: Using the #27 flange and it's comfortable per mom   Consultation Evaluation: Due to moms sore nipples , check all 3 babies for thrush , Baby Greig Castilla noted a white coating on tongue , even when wipe tongue off with wet cloth, Baby Madison - noted thin white coating only on the sides, [employer] did not note any.  All three are without diaper rashes , skin appears unremarkable.  Both nipples pinky red at the beginning of consult , and after changing nipple shield size appear to be less red.  Initial Feeding Assessment: Baby Madison  Pre-feed Weight:7-10.7 oz 3478g            Reweight wet diaper- 7-11.6 oz 3504g , 2nd reweight( wet diaper ) = 7-11.4 oz 3498g  Post-feed Weight:7-12.0 oz 3516g                                           -                                                                          = 7-13.6 oz 3560 g  Amount Transferred:38 ml                                                                                                                                    =  52 ml Total transfer for Madison = 90 ml  Supplemented with a bottle by dad 10 ml EBM  Comment = Latched well with consistent pattern with multiply swallows and with a larger nipple shield #24 , milk noted in the nipple shield after feeding . Permom comfortable with latch , increased swallowing noted with breast compressions. Fed for 15 -2 0 mins and re-latched   Additional Feeding Assessment:Baby Greig Castilla  Pre-feed Weight:7-9.9 oz 3454g  Post-feed Weight: 7-12.8 oz 3540 g  Amount Transferred:86 ml  Comments:Latched well in football position while [place] was eating , consistent pattern with multiply swallows , increased with breast compressions . #24 nipple shield - Per  Mom comfortable with milk noted in the nipple shield after the feeding ended . Fed for 20 mins   Additional Feeding Assessment:Baby Thomas  Pre-feed Weight:7-14.1 oz 3576 g     2nd latch - 7-15.7 oz 3620g  Post-feed Weight: 7-15.7 oz 3620 g                   - 8.0.2 oz 3634g  Amount Transferred:64ml                                   - 14 ml  Total volume= 58 ml  Comments:Baby Maisie Fus was last to wake up , latched well with a #24 Nipple shield with a consistent latch and depth. Fed for 15 -20 mins  Total Supplement Given: Madison  10 ml of EBM, Cathlyn Parsons satisfied after feeding and not supplemented , Vallery Sa the same . ( per mom regularly takes 90 ml at feeding with a bottle.   Lactation Plan of Care _- Sore nipple tx - Comfort gels , all purpose nipple cream ( as prescribed ), increase proteins and nutritious snacks and meals in diet, , change nipple shield to #24 ( better fit ) ,  Switch artifical nipple size to avent broad based nipple ,  When feeding with artifical nipple make sure the upper lip is flanged open. Shift feeding schedule more day /evening ( once at night instead of 2 times at night )by waking up 1st triplet at 2 1/2 hours to feed Increase volume at feedings as needed  Keep  up the great efforts of breast feeding and pumping.        If soreness doesn't improve with the change in nipple shield size increase - and consistent use with using the all purpose nipple cream  Call [facility] office back or call DR. -OB   Follow-Up- Consider weight checks at the [facility] in 2 weeks ,                    - Per mom [facility] - 04/22/2012 - weight checks and exams       Kathrin Greathouse 03/25/2012, 7:40 PM

## 2012-09-23 IMAGING — CR Imaging study
2 series · 2 of 2 positions shown · non-contrast
Comparison: None.

CLINICAL DATA: Pneumonia

CHEST - 2 VIEW

[view not recorded (1 of 2)]
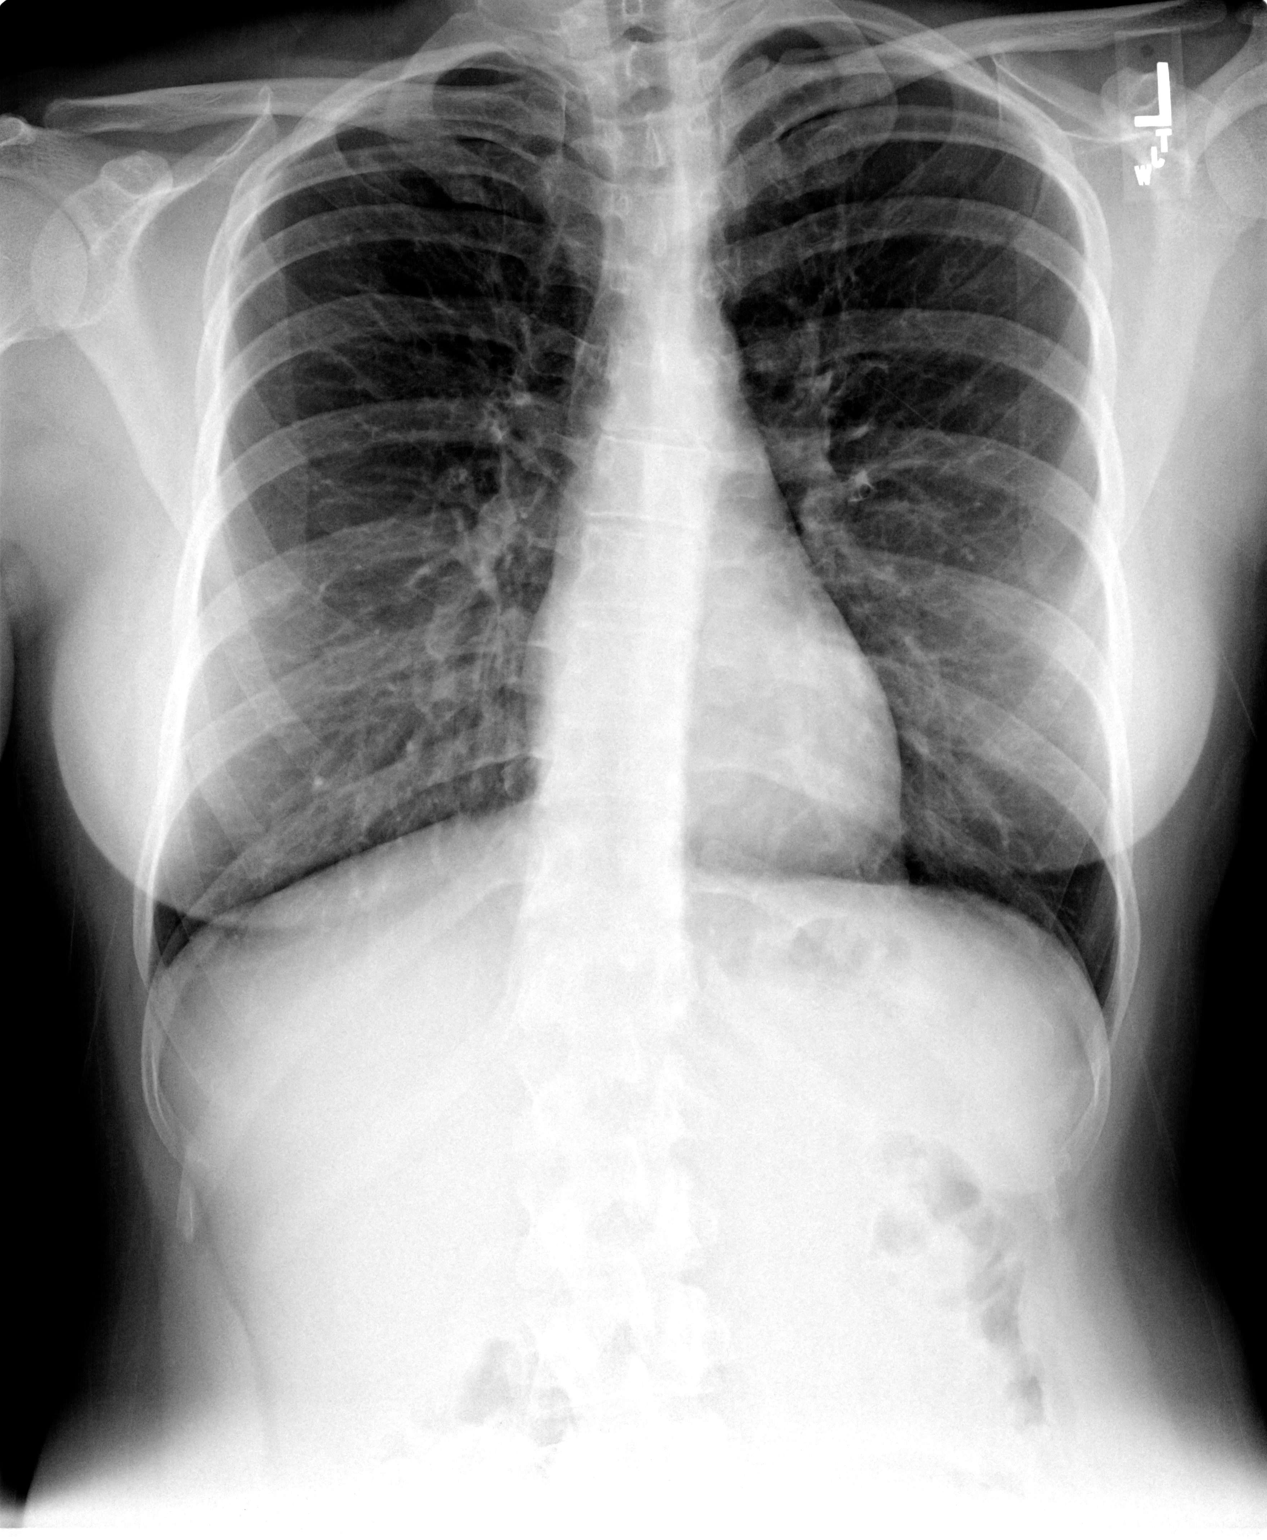

[view not recorded (2 of 2)]
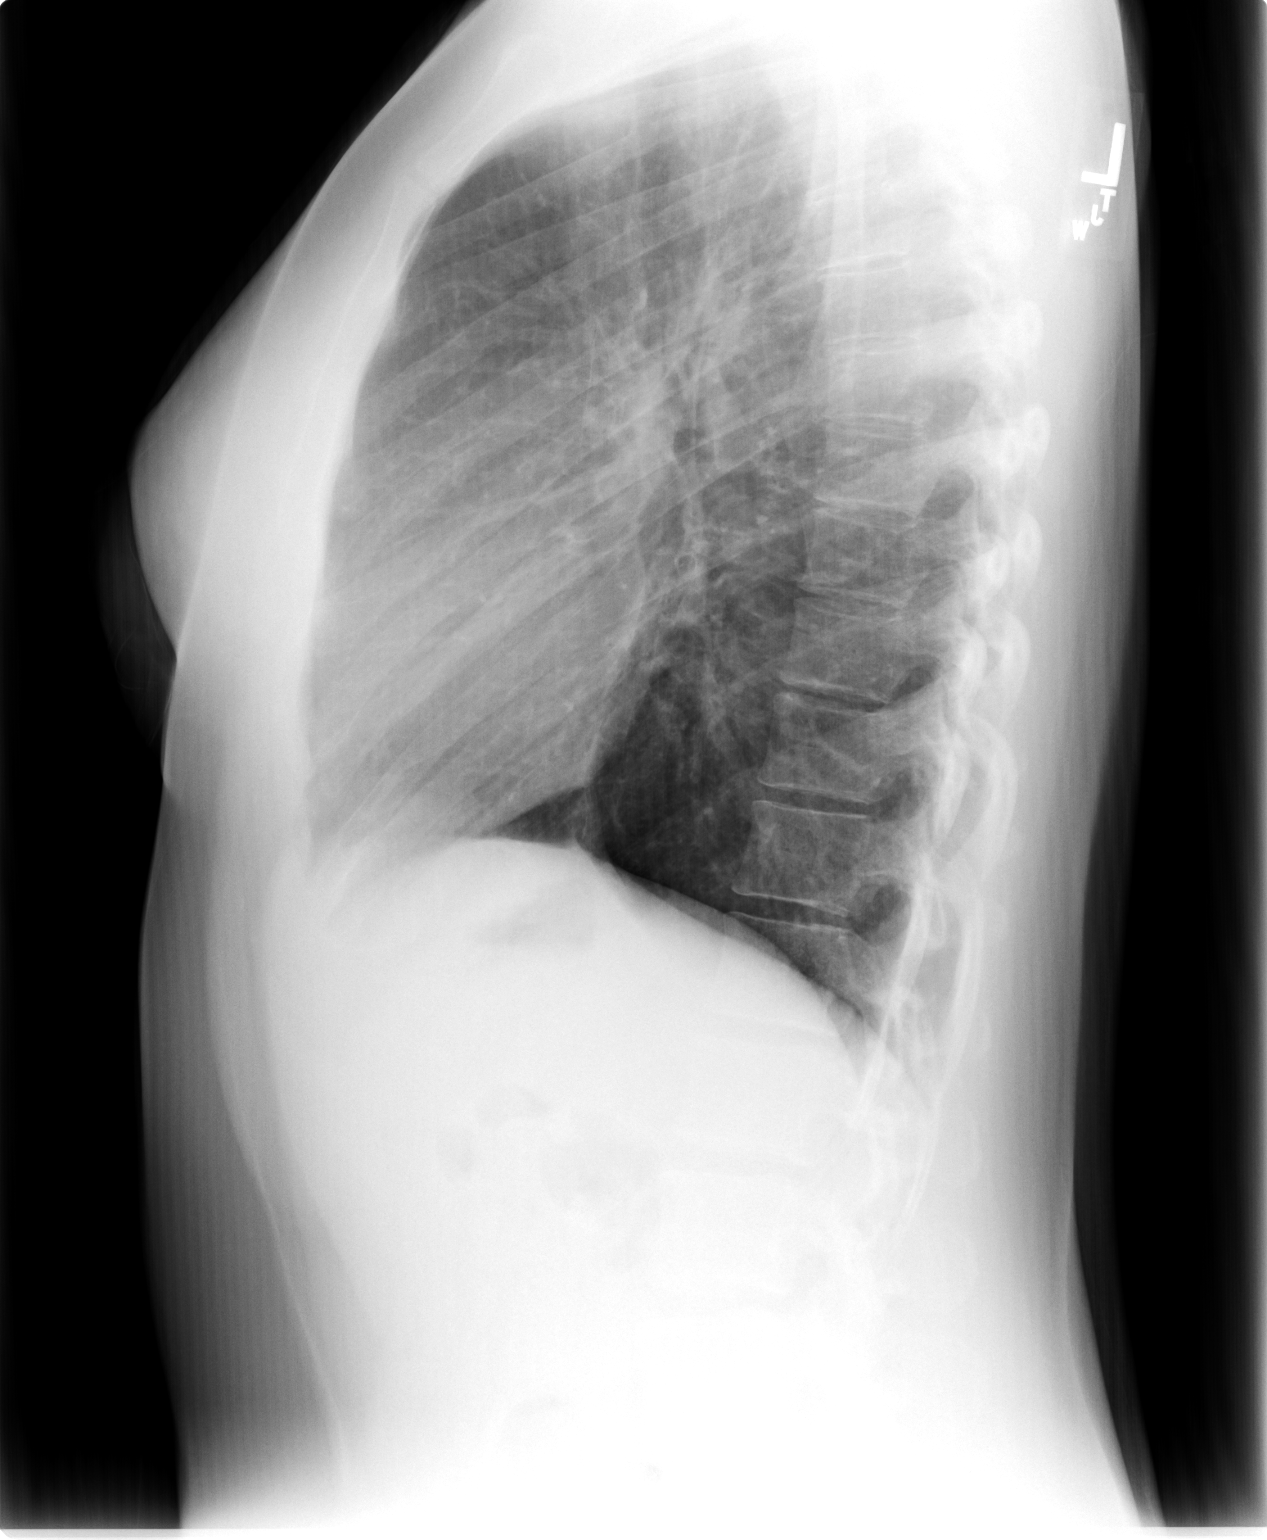

[2 of 2 positions shown; findings below may reference images not displayed]

FINDINGS: The cardiac silhouette, mediastinum, pulmonary
vasculature are within normal limits.  Both lungs are clear.
There is no acute bony abnormality.
IMPRESSION: There is no evidence of acute cardiac or pulmonary process.

## 2013-01-31 ENCOUNTER — Other Ambulatory Visit: Payer: Self-pay | Admitting: Pulmonary Disease

## 2013-02-03 ENCOUNTER — Telehealth: Payer: Self-pay | Admitting: Pulmonary Disease

## 2013-02-03 MED ORDER — ALBUTEROL SULFATE HFA 108 (90 BASE) MCG/ACT IN AERS: INHALATION_SPRAY | RESPIRATORY_TRACT | Status: DC

## 2013-02-03 NOTE — Telephone Encounter (Signed)
Pt aware that this has already been sent to pharmacy at 4:36 Pt states that she called pharmacy and at 5pm they did not have the Rx. I have resent this again to the pharmacy just in case there was a problem with the E-Scribing.  Pt scheduled an appt for Dec 23 at 915 with [facility]-- 1 year f/u

## 2013-03-11 ENCOUNTER — Ambulatory Visit (INDEPENDENT_AMBULATORY_CARE_PROVIDER_SITE_OTHER): Payer: BC Managed Care – PPO | Admitting: Pulmonary Disease

## 2013-03-11 ENCOUNTER — Encounter: Payer: Self-pay | Admitting: Pulmonary Disease

## 2013-03-11 VITALS — BP 112/80 | HR 81 | Temp 97.9°F | Ht 62.5 in | Wt 174.0 lb

## 2013-03-11 DIAGNOSIS — Z23 Encounter for immunization: Secondary | ICD-10-CM

## 2013-03-11 DIAGNOSIS — J45909 Unspecified asthma, uncomplicated: Secondary | ICD-10-CM

## 2013-03-11 NOTE — Progress Notes (Signed)
   Subjective:    Patient ID: Nicole Owens, female    DOB: 16-Oct-1980, 32 y.o.   MRN: [id number]  HPI Patient comes in today for followup of her known asthma. She has recently delivered twins, and had been staying on her medication regularly. Her dulera is no longer covered by her insurance company, and she just discontinued the medication rather than calling us for an alternative. She has not been overusing her rescue inhaler, and has not had a flare. At the same time, she has not been very active outside of her household   Review of Systems  Constitutional: Negative for fever and unexpected weight change.  HENT: Negative for congestion, dental problem, ear pain, nosebleeds, postnasal drip, rhinorrhea, sinus pressure, sneezing, sore throat and trouble swallowing.   Eyes: Negative for redness and itching.  Respiratory: Negative for cough, chest tightness, shortness of breath and wheezing.   Cardiovascular: Negative for palpitations and leg swelling.  Gastrointestinal: Negative for nausea and vomiting.  Genitourinary: Negative for dysuria.  Musculoskeletal: Negative for joint swelling.  Skin: Negative for rash.  Neurological: Negative for headaches.  Hematological: Does not bruise/bleed easily.  Psychiatric/Behavioral: Negative for dysphoric mood. The patient is not nervous/anxious.        Objective:   Physical Exam Well-developed female in no acute distress Nose without purulence or discharge noted Neck without lymphadenopathy or thyromegaly Chest totally clear to auscultation, no wheezing Cardiac exam with regular rate and rhythm Lower extremities without edema, no cyanosis Alert and oriented, moves all 4 extremities.       Assessment & Plan:

## 2013-03-11 NOTE — Patient Instructions (Signed)
Will change your dulera to symbicort 160/4.5, 2 inhalations am and pm everyday .  Keep mouth rinsed. Check your formulary, and let us know if this or advair is covered. If doing well, will see you back in one year.

## 2013-03-11 NOTE — Assessment & Plan Note (Signed)
The patient has done fairly well from an asthma standpoint, but needs to get back on a maintenance inhaler regimen. I have given her samples of Symbicort, and I've asked her to check and see if this is on her formulary. If not, we will arrange for an alternative. I have stressed to her again the importance of controlling airway inflammation with chronic maintenance medication.

## 2013-08-13 ENCOUNTER — Other Ambulatory Visit: Payer: Self-pay | Admitting: Pulmonary Disease

## 2013-10-03 ENCOUNTER — Telehealth: Payer: Self-pay | Admitting: Pulmonary Disease

## 2013-10-03 DIAGNOSIS — J45909 Unspecified asthma, uncomplicated: Secondary | ICD-10-CM

## 2013-10-03 NOTE — Telephone Encounter (Signed)
Spoke with the pt  She states that she is needing a new PFT done due to ins purposes  She states that her life ins policy is at a higher rate b/c of her last breathing test  [facility], please advise if okay to order this thanks

## 2013-10-03 NOTE — Telephone Encounter (Signed)
Let her know would not repeat the test unless she is on her everyday inhaler consistently for at least 4 weeks. If she is on the inhaler regularly like she is supposed to, ok to schedule for spirometry with Tammy pre and post bronchodilator.

## 2013-10-06 NOTE — Telephone Encounter (Signed)
Pt scheduled for Pre/Post 11/06/13 at 9am in our lab. Pt aware.  Order placed. Nothing further needed.

## 2013-10-14 ENCOUNTER — Telehealth: Payer: Self-pay | Admitting: Pulmonary Disease

## 2013-10-14 NOTE — Telephone Encounter (Signed)
lmtcb x 1  Patient Instructions     Will change your dulera to symbicort 160/4.5, 2 inhalations am and pm everyday . Keep mouth rinsed.  Check your formulary, and let us know if this or advair is covered.  If doing well, will see you back in one year.

## 2013-10-15 NOTE — Telephone Encounter (Signed)
lmtcb x1 

## 2013-10-15 NOTE — Telephone Encounter (Signed)
Pt returning call.Nicole Owens ° °

## 2013-10-16 MED ORDER — BUDESONIDE-FORMOTEROL FUMARATE 160-4.5 MCG/ACT IN AERO: 2.0000 | INHALATION_SPRAY | Freq: Two times a day (BID) | RESPIRATORY_TRACT | Status: DC

## 2013-10-16 NOTE — Telephone Encounter (Signed)
Spoke with patient aware that she needs to contact her insurance company to see what inhalers are covered by her plan.  Symbicort or Advair or any other alternatives similar.  Pt to contact our office back within the next 1-2 hours.

## 2013-10-16 NOTE — Telephone Encounter (Signed)
lmomtcb x 3 for pt 

## 2013-10-16 NOTE — Telephone Encounter (Signed)
Called spoke with pt. She reports her insurance will cover the symbicort. She wants RX for this called in. I have done so. Nothing further needed

## 2013-10-16 NOTE — Telephone Encounter (Signed)
Pt has 4:00 meeting [phone]

## 2013-12-01 ENCOUNTER — Ambulatory Visit (INDEPENDENT_AMBULATORY_CARE_PROVIDER_SITE_OTHER): Payer: BC Managed Care – PPO | Admitting: Pulmonary Disease

## 2013-12-01 ENCOUNTER — Other Ambulatory Visit: Payer: Self-pay | Admitting: Pulmonary Disease

## 2013-12-01 DIAGNOSIS — R0602 Shortness of breath: Secondary | ICD-10-CM

## 2013-12-01 LAB — PULMONARY FUNCTION TEST
FEF 25-75 Post: 2.4 L/sec
FEF 25-75 Pre: 1.93 L/sec
FEF2575-%Change-Post: 24 %
FEF2575-%Pred-Post: 75 %
FEF2575-%Pred-Pre: 60 %
FEV1-%CHANGE-POST: 5 %
FEV1-%PRED-PRE: 83 %
FEV1-%Pred-Post: 87 %
FEV1-POST: 2.54 L
FEV1-Pre: 2.4 L
FEV1FVC-%Change-Post: 3 %
FEV1FVC-%Pred-Pre: 91 %
FEV6-%Change-Post: 2 %
FEV6-%Pred-Post: 93 %
FEV6-%Pred-Pre: 90 %
FEV6-Post: 3.2 L
FEV6-Pre: 3.11 L
FEV6FVC-%Change-Post: 0 %
FEV6FVC-%Pred-Post: 100 %
FEV6FVC-%Pred-Pre: 100 %
FVC-%Change-Post: 2 %
FVC-%Pred-Post: 92 %
FVC-%Pred-Pre: 90 %
FVC-Post: 3.2 L
FVC-Pre: 3.13 L
POST FEV1/FVC RATIO: 79 %
POST FEV6/FVC RATIO: 100 %
PRE FEV6/FVC RATIO: 99 %
Pre FEV1/FVC ratio: 77 %

## 2013-12-01 NOTE — Progress Notes (Signed)
Spirometry pre and post done today. 

## 2014-01-19 ENCOUNTER — Encounter: Payer: Self-pay | Admitting: Pulmonary Disease

## 2014-03-11 ENCOUNTER — Ambulatory Visit: Payer: Self-pay | Admitting: Pulmonary Disease

## 2014-03-17 ENCOUNTER — Other Ambulatory Visit: Payer: Self-pay | Admitting: Pulmonary Disease

## 2014-03-23 ENCOUNTER — Telehealth: Payer: Self-pay | Admitting: Family Medicine

## 2014-03-23 NOTE — Telephone Encounter (Signed)
Left vm for pt to call and schedule flu shot.

## 2014-04-07 ENCOUNTER — Encounter: Payer: Self-pay | Admitting: Pulmonary Disease

## 2014-04-07 ENCOUNTER — Ambulatory Visit (INDEPENDENT_AMBULATORY_CARE_PROVIDER_SITE_OTHER): Payer: BLUE CROSS/BLUE SHIELD | Admitting: Pulmonary Disease

## 2014-04-07 ENCOUNTER — Ambulatory Visit: Payer: Self-pay | Admitting: Pulmonary Disease

## 2014-04-07 VITALS — BP 124/70 | HR 62 | Temp 97.6°F | Ht 63.0 in | Wt 167.0 lb

## 2014-04-07 DIAGNOSIS — J45909 Unspecified asthma, uncomplicated: Secondary | ICD-10-CM

## 2014-04-07 NOTE — Assessment & Plan Note (Signed)
The patient is doing very well on her current asthma regimen, and has not had an acute exacerbation since her last visit. She rarely uses her rescue inhaler, and currently is training for marathon without major issues. I have asked her to stay on her current medications, and to follow-up with me again in one year.

## 2014-04-07 NOTE — Progress Notes (Signed)
   Subjective:    Patient ID: Nicole Owens, female    DOB: [birth year], 34 y.o.   MRN: [id number]  HPI Patient comes in today for follow-up of her known asthma. She is staying on her medications compliantly, and has not had an acute exacerbation since last visit. She rarely uses her rescue inhaler, and is doing well with her training for marathon. She denies any significant cough, wheezes, or chest congestion.   Review of Systems  Constitutional: Negative for fever and unexpected weight change.  HENT: Negative for congestion, dental problem, ear pain, nosebleeds, postnasal drip, rhinorrhea, sinus pressure, sneezing, sore throat and trouble swallowing.   Eyes: Negative for redness and itching.  Respiratory: Negative for cough, chest tightness, shortness of breath and wheezing.   Cardiovascular: Negative for palpitations and leg swelling.  Gastrointestinal: Negative for nausea and vomiting.  Genitourinary: Negative for dysuria.  Musculoskeletal: Negative for joint swelling.  Skin: Negative for rash.  Neurological: Negative for headaches.  Hematological: Does not bruise/bleed easily.  Psychiatric/Behavioral: Negative for dysphoric mood. The patient is not nervous/anxious.        Objective:   Physical Exam Well-developed female in no acute distress Nose without purulence or discharge noted Neck without lymphadenopathy or thyromegaly Chest is totally clear to auscultation with excellent air flow and no wheezing. Cardiac exam with regular rate and rhythm Lower extremities without edema, no cyanosis Alert and oriented, moves all 4 extremities.       Assessment & Plan:

## 2014-04-07 NOTE — Patient Instructions (Signed)
No change in medications Continue to stay active, and let us know if increased symptoms. followup with me again in one year.

## 2014-08-18 ENCOUNTER — Other Ambulatory Visit: Payer: Self-pay | Admitting: Pulmonary Disease

## 2014-09-24 ENCOUNTER — Other Ambulatory Visit: Payer: Self-pay | Admitting: *Deleted

## 2014-09-24 MED ORDER — ALBUTEROL SULFATE HFA 108 (90 BASE) MCG/ACT IN AERS: INHALATION_SPRAY | RESPIRATORY_TRACT | Status: DC

## 2015-04-08 ENCOUNTER — Ambulatory Visit: Payer: Self-pay | Admitting: Pulmonary Disease

## 2015-04-16 ENCOUNTER — Encounter: Payer: Self-pay | Admitting: Internal Medicine

## 2015-04-16 ENCOUNTER — Ambulatory Visit (INDEPENDENT_AMBULATORY_CARE_PROVIDER_SITE_OTHER): Payer: BLUE CROSS/BLUE SHIELD | Admitting: Internal Medicine

## 2015-04-16 VITALS — BP 144/88 | HR 72 | Ht 63.0 in | Wt 172.0 lb

## 2015-04-16 DIAGNOSIS — J45909 Unspecified asthma, uncomplicated: Secondary | ICD-10-CM

## 2015-04-16 DIAGNOSIS — Z23 Encounter for immunization: Secondary | ICD-10-CM

## 2015-04-16 LAB — NITRIC OXIDE: Nitric Oxide: 21

## 2015-04-16 NOTE — Addendum Note (Signed)
Addended by: Nicanor Alcon on: 04/16/2015 05:54 PM   Modules accepted: Orders

## 2015-04-16 NOTE — Progress Notes (Signed)
Subjective:     Patient ID: Nicole Owens, female   DOB: 11/16/1980, 35 y.o.   MRN: [id number]  HPI  OV 04/16/2015  Chief Complaint  Patient presents with  . Follow-up    Former [facility] pt. Pt states her breathing is doing well. Pt states she only gets SOB if she is exposed to her "normal" triggers - smoke, cold, fragrences. Pt denies cough and CP/tightness.    82 year old mother of triplets were 79 years old she has a history of asthma and used to be followed by Dr. Jonathon Bellows. She is here for routine one-year follow-up. Background history of asthma since middle school. Last admission for asthma or emergency room visit was several years ago. Last prednisone use was a few years ago. She has cat sensitivity but not to the cat she has in her house but only to cats outside her house. She is on Symbicort for a few years. Compliance is around 50-60 percent. Because of triplet she forgets to take them at night. Overall she is doing well with her asthma. She is no longer running because of her busy life with triplets. Asthma control question score is 0 out of 6. In particular she does not wake up at night because of asthma. When she wakes up in the morning she has no symptoms. She is not limited in her activities because of asthma. She does not expands dyspnea because of asthma. And she has not had any wheeze. She has not used albuterol in the last 1 week. In total she is use albuterol 3 times in the last few months.  Exhaled nitric oxide is 21 ppb and is normal.   She has reluctantly agreed to have flu shot   Current outpatient prescriptions:  .  albuterol (VENTOLIN HFA) 108 (90 BASE) MCG/ACT inhaler, INHALE TWO PUFFS BY MOUTH THREE TIMES DAILY AS NEEDED FOR WHEEZING OR SHORTNESS OF BREATH, Disp: 18 each, Rfl: 5 .  SYMBICORT 160-4.5 MCG/ACT inhaler, INHALE TWO PUFFS BY MOUTH TWICE DAILY, Disp: 1 Inhaler, Rfl: 6  Immunization History  Administered Date(s) Administered  . Influenza,inj,Quad PF,36+ Mos 03/11/2013   . Rho (D) Immune Globulin 02/18/2012  . Td 06/17/2008  . Varicella 12/07/2010    Allergies  Allergen Reactions  . Aspirin Shortness Of Breath    Can trigger asthma, told nurse ibuprofen makes her feel odd and only takes one  tablet if needed      Review of Systems Per HPI    Objective:   Physical Exam  Constitutional: She is oriented to person, place, and time. She appears well-developed and well-nourished. No distress.  HENT:  Head: Normocephalic and atraumatic.  Right Ear: External ear normal.  Left Ear: External ear normal.  Mouth/Throat: Oropharynx is clear and moist. No oropharyngeal exudate.  Eyes: Conjunctivae and EOM are normal. Pupils are equal, round, and reactive to light. Right eye exhibits no discharge. Left eye exhibits no discharge. No scleral icterus.  Neck: Normal range of motion. Neck supple. No JVD present. No tracheal deviation present. No thyromegaly present.  Cardiovascular: Normal rate, regular rhythm, normal heart sounds and intact distal pulses.  Exam reveals no gallop and no friction rub.   No murmur heard. Pulmonary/Chest: Effort normal and breath sounds normal. No respiratory distress. She has no wheezes. She has no rales. She exhibits no tenderness.  Abdominal: Soft. Bowel sounds are normal. She exhibits no distension and no mass. There is no tenderness. There is no rebound and no guarding.  Musculoskeletal: Normal  range of motion. She exhibits no edema or tenderness.  Lymphadenopathy:    She has no cervical adenopathy.  Neurological: She is alert and oriented to person, place, and time. She has normal reflexes. No cranial nerve deficit. She exhibits normal muscle tone. Coordination normal.  Skin: Skin is warm and dry. No rash noted. She is not diaphoretic. No erythema. No pallor.  Psychiatric: She has a normal mood and affect. Her behavior is normal. Judgment and thought content normal.  Vitals reviewed.   Filed Vitals:   04/16/15 0954   BP: 144/88  Pulse: 72  Height:  (1.6 m)  Weight: 172 lb (78.019 kg)  SpO2: 98%         Assessment:       ICD-9-CM ICD-10-CM   1. Asthma, intrinsic, unspecified asthma severity, uncomplicated 493.10 J45.909     *   Plan:      Asthma appears well controlled  Plan Reduce Symbicort 2 puff once daily Use albuterol as needed For exercise always do warm up and cool down and take albuterol before exercise- Flu shot today 04/16/2015  Follow-up - 6 months and at that time do exhaled nitric oxide and asthma control question    Dr. Kalman Shan, M.D., [facility].C.P Pulmonary and Critical Care Medicine Staff Physician [facility] Pulmonary and Critical Care Pager: [phone], If no answer or between  15:00h - 7:00h: call [phone]  04/16/2015 10:36 AM

## 2015-04-16 NOTE — Patient Instructions (Signed)
ICD-9-CM ICD-10-CM   1. Asthma, intrinsic, unspecified asthma severity, uncomplicated 493.10 J45.909    Asthma appears well controlled  Plan Reduce Symbicort 2 puff once daily Use albuterol as needed For exercise always do warm up and cool down and take albuterol before exercise- Flu shot today 04/16/2015  Follow-up - 6 months and at that time do exhaled nitric oxide and asthma control question

## 2015-08-25 ENCOUNTER — Other Ambulatory Visit: Payer: Self-pay | Admitting: *Deleted

## 2015-08-25 MED ORDER — BUDESONIDE-FORMOTEROL FUMARATE 160-4.5 MCG/ACT IN AERO: 2.0000 | INHALATION_SPRAY | Freq: Two times a day (BID) | RESPIRATORY_TRACT | Status: DC

## 2015-10-07 ENCOUNTER — Encounter: Payer: Self-pay | Admitting: Internal Medicine

## 2015-10-07 ENCOUNTER — Encounter (INDEPENDENT_AMBULATORY_CARE_PROVIDER_SITE_OTHER): Payer: Self-pay

## 2015-10-07 ENCOUNTER — Ambulatory Visit (INDEPENDENT_AMBULATORY_CARE_PROVIDER_SITE_OTHER): Payer: BLUE CROSS/BLUE SHIELD | Admitting: Internal Medicine

## 2015-10-07 VITALS — BP 130/72 | HR 87 | Ht 63.0 in | Wt 165.6 lb

## 2015-10-07 DIAGNOSIS — J45909 Unspecified asthma, uncomplicated: Secondary | ICD-10-CM

## 2015-10-07 LAB — NITRIC OXIDE: Nitric Oxide: 42

## 2015-10-07 NOTE — Patient Instructions (Signed)
ICD-9-CM ICD-10-CM   1. Asthma, intrinsic, unspecified asthma severity, uncomplicated 493.10 J45.909 Nitric oxide    Slight increase in symptoms and inflammation- probably due to decision to reduce symbicort in early 2017  Plan  - Go back to Symbicort 160/4.5 @ 2 puff 2 times daily  - Do take a sample of low-dose Brio and tried once daily and see if this is equivalent and if her insurance will cover it - Use albuterol as needed especially before run or when having problems - Continue to ensure that you do warmup before your runs - Flu shot in the fall - Pneumonia vaccine recommended for asthmatics you can have one today 10/07/2015   Follow-up - 6 months or sooner if needed

## 2015-10-07 NOTE — Progress Notes (Signed)
Subjective:     Patient ID: Nicole Owens, female   DOB: Aug 20, 1980, 35 y.o.   MRN: [id number]  HPI  OV 04/16/2015  Chief Complaint  Patient presents with  . Follow-up    Former [facility] pt. Pt states her breathing is doing well. Pt states she only gets SOB if she is exposed to her "normal" triggers - smoke, cold, fragrences. Pt denies cough and CP/tightness.    35 year old mother of triplets were 35 years old she has a history of asthma and used to be followed [facility]. She is here for routine one-year follow-up. Background history of asthma since middle school. Last admission for asthma or emergency room visit was several years ago. Last prednisone use was a few years ago. She has cat sensitivity but not to the cat she has in her house but only to cats outside her house. She is on Symbicort for a few years. Compliance is around 50-60 percent. Because of triplet she forgets to take them at night. Overall she is doing well with her asthma. She is no longer running because of her busy life with triplets. Asthma control question score is 0 out of 6. In particular she does not wake up at night because of asthma. When she wakes up in the morning she has no symptoms. She is not limited in her activities because of asthma. She does not expands dyspnea because of asthma. And she has not had any wheeze. She has not used albuterol in the last 1 week. In total she is use albuterol 3 times in the last few months.  Exhaled nitric oxide is 21 ppb and is normal.   She has reluctantly agreed to have flu shot    OV 10/07/2015  Chief Complaint  Patient presents with  . Follow-up    Pt reports she is unable to do any running w/o having to use her rescue inhaler.     Follow-up moderate persistent asthma on Symbicort  Last seen early 2017. This is a routine 6 month follow-up. She says that now she's been running several times a week 3 miles as part of her routine exercise/marathon preparation. She feels that  after I reduced his Symbicort dosage based on 50% compliance and normal exhaled nitric oxide, she feels that she's having increased symptomatology. In fact asthma control question shows score of 1.2 whereas it was 0 before. She feels that her runs of more difficult. This is despite the fact she warms up. She is also saying that she feels the radius Symbicort is responsible despite factoring in the current heat and humidity and taking albuterol before the runs. Otherwise feels fine no problems.   Asthma Control Panel 04/16/15 10/07/2015   Current Med Regimen   symbicort at 50% compliance symbicort 2puff once dail  ACQ 5 point- 1 week. wtd avg score. <1.0 is good control 0.75-1.25 is grey zone. >1.25 poor control. 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   reports that she has never smoked. She has never used smokeless tobacco.  Past Surgical History  Procedure Laterality Date  .  No past surgeries    . Wisdom tooth extraction    . Cesarean section  02/16/2012    Procedure: CESAREAN SECTION;  Surgeon: Lenoard Aden, MD;  Location: [facility];  Service: Obstetrics;  Laterality: N/A;    Allergies  Allergen Reactions  . Aspirin Shortness Of Breath    Can trigger asthma, told nurse ibuprofen makes her feel odd and only takes one 200mg  tablet if needed    Immunization History  Administered Date(s) Administered  . Influenza,inj,Quad PF,36+ Mos 03/11/2013, 04/16/2015  . Rho (D) Immune Globulin 02/18/2012  . Td 06/17/2008  . Varicella 12/07/2010    Family History   Problem Relation Age of Onset  . Hypertension Father   . Obesity Sister      Current outpatient prescriptions:  .  albuterol (VENTOLIN HFA) 108 (90 BASE) MCG/ACT inhaler, INHALE TWO PUFFS BY MOUTH THREE TIMES DAILY AS NEEDED FOR WHEEZING OR SHORTNESS OF BREATH, Disp: 18 each, Rfl: 5 .  budesonide-formoterol (SYMBICORT) 160-4.5 MCG/ACT inhaler, Inhale 2 puffs into the lungs 2 (two) times daily. (Patient taking differently: Inhale 2 puffs into the lungs daily. ), Disp: 1 Inhaler, Rfl: 1    Review of Systems     Objective:   Physical Exam  Constitutional: She is oriented to person, place, and time. She appears well-developed and well-nourished. No distress.  HENT:  Head: Normocephalic and atraumatic.  Right Ear: External ear normal.  Left Ear: External ear normal.  Mouth/Throat: Oropharynx is clear and moist. No oropharyngeal exudate.  Eyes: Conjunctivae and EOM are normal. Pupils are equal, round, and reactive to light. Right eye exhibits no discharge. Left eye exhibits no discharge. No scleral icterus.  Neck: Normal range of motion. Neck supple. No JVD present. No tracheal deviation present. No thyromegaly present.  Cardiovascular: Normal rate, regular rhythm, normal heart sounds and intact distal pulses.  Exam reveals no gallop and no friction rub.   No murmur heard. Pulmonary/Chest: Effort normal and breath sounds normal. No respiratory distress. She has no wheezes. She has no rales. She exhibits no tenderness.  Abdominal: Soft. Bowel sounds are normal. She exhibits no distension and no mass. There is no tenderness. There is no rebound and no guarding.  Musculoskeletal: Normal range of motion. She exhibits no edema or tenderness.  Lymphadenopathy:    She has no cervical adenopathy.  Neurological: She is alert and oriented to person, place, and time. She has normal reflexes. No cranial nerve deficit. She exhibits normal muscle tone. Coordination normal.  Skin: Skin is warm and dry.  No rash noted. She is not diaphoretic. No erythema. No pallor.  Psychiatric: She has a normal mood and affect. Her behavior is normal. Judgment and thought content normal.  Vitals reviewed.  Filed Vitals:   10/07/15 1144  BP: 130/72  Pulse: 87  Height: 5\' 3"  (1.6 m)  Weight: 165 lb 9.6 oz (75.116 kg)  SpO2: 98%       Assessment:       ICD-9-CM ICD-10-CM   1. Asthma, intrinsic, unspecified asthma severity, uncomplicated 493.10 J45.909 Nitric oxide       Plan:       Slight increase in symptoms and inflammation- probably due to decision to reduce symbicort in early 2017  Plan  - Go back to Symbicort 160/4.5 @ 2 puff 2 times daily  - Do take a sample of low-dose Brio and tried once daily and see if this is equivalent and if her insurance will cover it - Use albuterol  as needed especially before run or when having problems - Continue to ensure that you do warmup before your runs - Flu shot in the fall - Pneumonia vaccine recommended for asthmatics you can have one today 10/07/2015   Follow-up - 6 months or sooner if needed    Dr. Kalman Shan, M.D., [facility].C.P Pulmonary and Critical Care Medicine Staff Physician [facility] Pulmonary and Critical Care Pager: [phone], If no answer or between  15:00h - 7:00h: call [phone]  10/07/2015 12:25 PM

## 2015-12-18 ENCOUNTER — Other Ambulatory Visit: Payer: Self-pay | Admitting: Internal Medicine

## 2015-12-18 ENCOUNTER — Other Ambulatory Visit: Payer: Self-pay | Admitting: Adult Health

## 2016-04-07 DIAGNOSIS — J111 Influenza due to unidentified influenza virus with other respiratory manifestations: Secondary | ICD-10-CM | POA: Diagnosis not present

## 2016-04-26 ENCOUNTER — Ambulatory Visit: Payer: Self-pay | Admitting: Internal Medicine

## 2016-06-01 DIAGNOSIS — H52213 Irregular astigmatism, bilateral: Secondary | ICD-10-CM | POA: Diagnosis not present

## 2016-06-27 DIAGNOSIS — Z6829 Body mass index (BMI) 29.0-29.9, adult: Secondary | ICD-10-CM | POA: Diagnosis not present

## 2016-06-27 DIAGNOSIS — Z01419 Encounter for gynecological examination (general) (routine) without abnormal findings: Secondary | ICD-10-CM | POA: Diagnosis not present

## 2016-06-27 DIAGNOSIS — Z1151 Encounter for screening for human papillomavirus (HPV): Secondary | ICD-10-CM | POA: Diagnosis not present

## 2016-08-06 ENCOUNTER — Other Ambulatory Visit: Payer: Self-pay | Admitting: Internal Medicine

## 2017-02-05 ENCOUNTER — Other Ambulatory Visit: Payer: Self-pay | Admitting: Internal Medicine

## 2017-04-26 ENCOUNTER — Other Ambulatory Visit: Payer: Self-pay | Admitting: Internal Medicine

## 2017-04-26 DIAGNOSIS — J0140 Acute pansinusitis, unspecified: Secondary | ICD-10-CM | POA: Diagnosis not present

## 2017-06-27 ENCOUNTER — Other Ambulatory Visit: Payer: Self-pay | Admitting: Internal Medicine

## 2017-07-11 ENCOUNTER — Telehealth: Payer: Self-pay | Admitting: Internal Medicine

## 2017-07-11 MED ORDER — BUDESONIDE-FORMOTEROL FUMARATE 160-4.5 MCG/ACT IN AERO: 2.0000 | INHALATION_SPRAY | Freq: Two times a day (BID) | RESPIRATORY_TRACT | 0 refills | Status: DC

## 2017-07-11 NOTE — Telephone Encounter (Signed)
Spoke with patient. She was requesting a refill on her Symbicort 160. Patient stated that she is aware that she has not been seen since 2017 and has made an appt with MR for next month.   Advised patient I will send in a one month ref[facility] for [facility] to [facility] [place]. She verbalized understanding. Nothing else needed at time of call.

## 2017-08-01 ENCOUNTER — Ambulatory Visit: Payer: BLUE CROSS/BLUE SHIELD | Admitting: Internal Medicine

## 2017-08-01 ENCOUNTER — Encounter: Payer: Self-pay | Admitting: Internal Medicine

## 2017-08-01 VITALS — BP 114/76 | HR 86 | Ht 63.0 in | Wt 149.4 lb

## 2017-08-01 DIAGNOSIS — J4540 Moderate persistent asthma, uncomplicated: Secondary | ICD-10-CM

## 2017-08-01 MED ORDER — BUDESONIDE-FORMOTEROL FUMARATE 160-4.5 MCG/ACT IN AERO: 2.0000 | INHALATION_SPRAY | Freq: Two times a day (BID) | RESPIRATORY_TRACT | 0 refills | Status: DC

## 2017-08-01 NOTE — Progress Notes (Signed)
Subjective:     Patient ID: Nicole Owens, female   DOB: 03-19-1981, 37 y.o.   MRN: [id number]  HPI  HPI  OV 04/16/2015  Chief Complaint  Patient presents with  . Follow-up    Former [facility] pt. Pt states her breathing is doing well. Pt states she only gets SOB if she is exposed to her "normal" triggers - smoke, cold, fragrences. Pt denies cough and CP/tightness.    89 year old mother of triplets were 65 years old she has a history of asthma and used to be followed by Dr. Jonathon Bellows. She is here for routine one-year follow-up. Background history of asthma since middle school. Last admission for asthma or emergency room visit was several years ago. Last prednisone use was a few years ago. She has cat sensitivity but not to the cat she has in her house but only to cats outside her house. She is on Symbicort for a few years. Compliance is around 50-60 percent. Because of triplet she forgets to take them at night. Overall she is doing well with her asthma. She is no longer running because of her busy life with triplets. Asthma control question score is 0 out of 6. In particular she does not wake up at night because of asthma. When she wakes up in the morning she has no symptoms. She is not limited in her activities because of asthma. She does not expands dyspnea because of asthma. And she has not had any wheeze. She has not used albuterol in the last 1 week. In total she is use albuterol 3 times in the last few months.  Exhaled nitric oxide is 21 ppb and is normal.   She has reluctantly agreed to have flu shot    OV 10/07/2015  Chief Complaint  Patient presents with  . Follow-up    Pt reports she is unable to do any running w/o having to use her rescue inhaler.     Follow-up moderate persistent asthma on Symbicort  Last seen early 2017. This is a routine 6 month follow-up. She says that now she's been running several times a week 3 miles as part of her routine exercise/marathon preparation. She feels  that after I reduced his Symbicort dosage based on 50% compliance and normal exhaled nitric oxide, she feels that she's having increased symptomatology. In fact asthma control question shows score of 1.2 whereas it was 0 before. She feels that her runs of more difficult. This is despite the fact she warms up. She is also saying that she feels the radius Symbicort is responsible despite factoring in the current heat and humidity and taking albuterol before the runs. Otherwise feels fine no problems.   OV 08/01/2017  Chief Complaint  Patient presents with  . Follow-up    Last seen 10/07/15. Pt states she has been doing good and denies any complaints or concerns.   Follow-up moderate persistent asthma on Symbicort  Last seen July 2017.  She then did not follow-up because she has been doing well.  She made this appointment after she started realizing she was running out of Symbicort and call for a refill in the office asked her to make a follow-up.  She tells me that she is 100% compliant with Symbicort 160/4 0.5 to 2 puffs twice daily.  Her albuterol use is rare.  She works out at [employer] and also does running.  She does high intensity interval training.  For all this she does warm up and cool  down as advised her at the previous visit.  She also takes albuterol preexercise and during exercise if needed.  Her asthma control questionnaire is 0.6 on a five-point scale but all of the symptoms are only at exercise.  At baseline she does not wake up at night and when she wakes up she has no symptoms.  She feels very slightly limited because of her asthma and she is a little short of breath because of asthma and it is totally during sprints and workouts.  She does not wheeze and she uses her albuterol 1 or 2 times a week for high intensity workout only.  I have told her that she would need at least annual follow-up   Asthma Control Panel 04/16/15 10/07/2015  08/01/2017   Current Med Regimen   symbicort  at 50% compliance symbicort 2puff once dail symbicort 160 at 2 puff bid  ACQ 5 point- 1 week. wtd avg score. <1.0 is good control 0.75-1.25 is grey zone. >1.25 poor control. 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   reports that she has never smoked. She has never used smokeless tobacco.  Past Surgical History:  Procedure Laterality Date  . CESAREAN SECTION  02/16/2012   Procedure: CESAREAN SECTION;  Surgeon: Lenoard Aden, MD;  Location: [facility];  Service: Obstetrics;  Laterality: N/A;  . NO PAST SURGERIES    . WISDOM TOOTH EXTRACTION      Allergies  Allergen Reactions  . Aspirin Shortness Of Breath    Can trigger asthma, told nurse ibuprofen makes her feel odd and only takes one  tablet if needed    Immunization History  Administered Date(s) Administered  . Influenza,inj,Quad PF,6+ Mos 03/11/2013, 04/16/2015  . Rho (D) Immune Globulin 02/18/2012  . Td 06/17/2008  . Varicella 12/07/2010    Family History  Problem Relation Age of Onset  . Hypertension Father   . Obesity Sister      Current Outpatient Medications:  .  budesonide-formoterol (SYMBICORT) 160-4.5 MCG/ACT inhaler, Inhale 2 puffs into the lungs 2 (two) times daily., Disp: 1 Inhaler, Rfl: 0 .  VENTOLIN HFA 108 (90 Base) MCG/ACT inhaler, INHALE TWO PUFFS BY MOUTH THREE TIMES DAILY AS NEEDED FOR WHEEZING OR  SHORTNESS  OF  BREATH, Disp: 18 each, Rfl: 5   Review of Systems     Objective:   Physical Exam  Constitutional: She is  oriented to person, place, and time. She appears well-developed and well-nourished. No distress.  HENT:  Head: Normocephalic and atraumatic.  Right Ear: External ear normal.  Left Ear: External ear normal.  Mouth/Throat: Oropharynx is clear and moist. No oropharyngeal exudate.  Eyes: Pupils are equal, round, and reactive to light. Conjunctivae and EOM are normal. Right eye exhibits no discharge. Left eye exhibits no discharge. No scleral icterus.  Neck: Normal range of motion. Neck supple. No JVD present. No tracheal deviation present. No thyromegaly present.  Cardiovascular: Normal rate, regular rhythm, normal heart sounds and intact distal pulses. Exam reveals no gallop and no friction rub.  No murmur  heard. Pulmonary/Chest: Effort normal and breath sounds normal. No respiratory distress. She has no wheezes. She has no rales. She exhibits no tenderness.  Abdominal: Soft. Bowel sounds are normal. She exhibits no distension and no mass. There is no tenderness. There is no rebound and no guarding.  Musculoskeletal: Normal range of motion. She exhibits no edema or tenderness.  Lymphadenopathy:    She has no cervical adenopathy.  Neurological: She is alert and oriented to person, place, and time. She has normal reflexes. No cranial nerve deficit. She exhibits normal muscle tone. Coordination normal.  Skin: Skin is warm and dry. No rash noted. She is not diaphoretic. No erythema. No pallor.  Psychiatric: She has a normal mood and affect. Her behavior is normal. Judgment and thought content normal.  Vitals reviewed.  Vitals:   08/01/17 0953  BP: 114/76  Pulse: 86  SpO2: 97%  Weight: 149 lb 6.4 oz (67.8 kg)  Height:  (1.6 m)    Estimated body mass index is 26.47 kg/m as calculated from the following:   Height as of this encounter:  (1.6 m).   Weight as of this encounter: 149 lb 6.4 oz (67.8 kg).       Assessment:       ICD-10-CM   1. Asthma, well controlled, moderate  persistent J45.40        Plan:      Well controlled asthma  Plan Continue symbicort 160/4.5 at 2 puff twice daily with mouth rinse Use albuterol as needed and for pre-exercise  Do warm up and cool down for exercise Flu shot in fall  Followup 1 year and if doing well , can consider tapering your symbicort    Dr. Kalman Shan, M.D., [facility].C.P Pulmonary and Critical Care Medicine Staff Physician, [facility] Center Director - Interstitial Lung Disease  Program  Pulmonary Fibrosis [facility] at [facility] [place], [place], [address]  Pager: [phone], If no answer or between  15:00h - 7:00h: call [phone] Telephone: [phone]

## 2017-08-01 NOTE — Patient Instructions (Addendum)
ICD-10-CM   1. Asthma, well controlled, moderate persistent J45.40     Well controlled asthma  Plan Continue symbicort 160/4.5 at 2 puff twice daily with mouth rinse Use albuterol as needed and for pre-exercise  Do warm up and cool down for exercise Flu shot in fall  Followup 1 year and if doing well , can consider tapering your symbicort

## 2017-08-01 NOTE — Addendum Note (Signed)
Addended by: Sylvester Harder on: 08/01/2017 10:39 AM   Modules accepted: Orders

## 2017-09-13 DIAGNOSIS — J029 Acute pharyngitis, unspecified: Secondary | ICD-10-CM | POA: Diagnosis not present

## 2017-09-26 ENCOUNTER — Other Ambulatory Visit: Payer: Self-pay | Admitting: Internal Medicine

## 2018-02-13 DIAGNOSIS — H52213 Irregular astigmatism, bilateral: Secondary | ICD-10-CM | POA: Diagnosis not present

## 2018-03-25 ENCOUNTER — Telehealth: Payer: Self-pay | Admitting: Internal Medicine

## 2018-03-25 MED ORDER — ALBUTEROL SULFATE HFA 108 (90 BASE) MCG/ACT IN AERS: INHALATION_SPRAY | RESPIRATORY_TRACT | 5 refills | Status: DC

## 2018-03-25 NOTE — Telephone Encounter (Signed)
Pt requesting refill on albuterol.  This has been sent to preferred pharmacy.  Nothing further needed at this time- will close encounter.   

## 2018-03-27 ENCOUNTER — Telehealth: Payer: Self-pay | Admitting: Internal Medicine

## 2018-03-27 NOTE — Telephone Encounter (Signed)
Called and spoke with Patient. She stated that she has never had to pay for Ventolin inhaler, and yesterday she was told she would pay $52. She was told that her insurance needed another diagnosis code. Called and spoke with her pharmacy, [address], [place], spoke with [employer].  She stated that she was unaware that a diagnosis code was needed.  Called and spoke with [employer], [phone], spoke with Ethelene BrownsAnthony. He stated that he was unable to see if anything, because the claim was not showing as of now, it may take a couple of days.  He stated that insurance should call if anything further is needed.  Called and spoke with Patient, who stated that she called [phone], yesterday, and was told to hold off picking up prescription until diagnosis code was given.  She stated that the diagnosis has to preventative not general medical. Called [employer] back, spoke with Dondra SpryGail, who stated that the Patient may have a deductible, but was unsure.  Dondra SpryGail was transferring me to someone else, was placed on hold for 45mins.   Will try again this afternoon.

## 2018-03-27 NOTE — Telephone Encounter (Signed)
Attempted to call [employer], was told high number of calls, placed on hold for 22 minutes.   Called and spoke with Patient to let her know that we were still trying to get in touch with [employer] about diagnosis code needed. Will leave message open to follow up

## 2018-03-27 NOTE — Telephone Encounter (Signed)
Called [employer] back, at [phone] (number Dondra Spry gave last  to call back).  Placed on hold for over . Will try to contact [employer] at a later time.

## 2018-03-28 NOTE — Telephone Encounter (Signed)
ATC [employer] this morning-was placed on hold then hung up on. Triage to try again today.

## 2018-03-28 NOTE — Telephone Encounter (Signed)
ATC [employer] and was placed on hold for greater then . Spoke to pt and updated her. Pt states she will attempted to contact [employer] as well.  Will call back.

## 2018-03-29 NOTE — Telephone Encounter (Signed)
Spoke with pt, she stated everything has been resolved. Nothing further is needed.

## 2018-03-29 NOTE — Telephone Encounter (Signed)
Patient called states insurance [employer] issue has been resolved.  Phone number is [phone].

## 2018-06-24 DIAGNOSIS — J0121 Acute recurrent ethmoidal sinusitis: Secondary | ICD-10-CM | POA: Diagnosis not present

## 2018-06-24 DIAGNOSIS — R42 Dizziness and giddiness: Secondary | ICD-10-CM | POA: Diagnosis not present

## 2018-10-16 DIAGNOSIS — H66002 Acute suppurative otitis media without spontaneous rupture of ear drum, left ear: Secondary | ICD-10-CM | POA: Diagnosis not present

## 2018-10-16 DIAGNOSIS — J019 Acute sinusitis, unspecified: Secondary | ICD-10-CM | POA: Diagnosis not present

## 2018-11-14 ENCOUNTER — Other Ambulatory Visit: Payer: Self-pay

## 2018-11-14 DIAGNOSIS — Z20822 Contact with and (suspected) exposure to covid-19: Secondary | ICD-10-CM

## 2018-11-15 LAB — NOVEL CORONAVIRUS, NAA: SARS-CoV-2, NAA: NOT DETECTED

## 2018-11-25 ENCOUNTER — Other Ambulatory Visit: Payer: Self-pay | Admitting: Internal Medicine

## 2019-01-30 DIAGNOSIS — J029 Acute pharyngitis, unspecified: Secondary | ICD-10-CM | POA: Diagnosis not present

## 2019-01-30 DIAGNOSIS — H66001 Acute suppurative otitis media without spontaneous rupture of ear drum, right ear: Secondary | ICD-10-CM | POA: Diagnosis not present

## 2019-02-07 ENCOUNTER — Other Ambulatory Visit: Payer: Self-pay

## 2019-02-10 ENCOUNTER — Ambulatory Visit (INDEPENDENT_AMBULATORY_CARE_PROVIDER_SITE_OTHER): Payer: BC Managed Care – PPO | Admitting: Family Medicine

## 2019-02-10 ENCOUNTER — Encounter: Payer: Self-pay | Admitting: Family Medicine

## 2019-02-10 ENCOUNTER — Other Ambulatory Visit: Payer: Self-pay

## 2019-02-10 VITALS — BP 138/90 | HR 90 | Wt 149.0 lb

## 2019-02-10 DIAGNOSIS — S3981XA Other specified injuries of abdomen, initial encounter: Secondary | ICD-10-CM

## 2019-02-10 NOTE — Patient Instructions (Addendum)
Thank you for coming in today. You should hear from PT Recheck in 4 weeks with me.  Return or let me know sooner if not doing well.    Sports Hernia A sports hernia is a type of soft tissue injury that causes groin pain. Any strain or tear in the soft tissues of the groin or lower abdomen can be called a sports hernia. It is a common overuse injury in athletes who play sports that involve a lot of repetitive kicking and twisting motions or sudden changes in direction. A sports hernia is not the same kind of injury as other types of hernias. With a sports hernia, there is usually no opening in the muscles of the groin or abdomen for tissue to bulge through. What are the causes? This condition is caused by muscle strain or torn tendons, usually from overuse. It may also be caused by weakness or imbalance in your abdominal muscles. What increases the risk? You may be more likely to develop this condition if you are an athlete who plays:  Ice hockey.  Football.  Wrestling.  Soccer.  Rugby. Sports hernias are more common in males. What are the signs or symptoms? The main symptom of this condition is severe groin pain on one side of the abdomen. This pain is often sharp with activity and better with rest. There is no visible bulge in the abdomen. Pain typically increases with:  Running, especially sprinting.  Running and changing directions quickly.  Abdominal exercises.  Twisting or kicking motions.  Coughing or sneezing.  The application of pressure over the injured area. How is this diagnosed? This condition is diagnosed based on:  A physical exam. During the exam, your health care provider may feel for areas of tenderness or ask you to do certain movements to test for pain.  Your medical history.  Imaging studies. These may include X-rays or an MRI. How is this treated? Treatment for this condition includes:  Stopping all activities that cause pain or make your  condition worse.  Using ice to relieve pain and inflammation.  Taking NSAIDs or getting corticosteroid injections to lessen pain and swelling.  Doing range-of-motion and strengthening exercises (physical therapy) as told by your health care provider or physical therapist. You may need surgery to help repair and stabilize the injured area if other treatments do not work. Follow these instructions at home: Managing pain and swelling   If directed, put ice on the injured area. ? Put ice in a plastic bag. ? Place a towel between your skin and the bag. ? Leave the ice on for 20 minutes, 2-3 times a day. General instructions  Take over-the-counter and prescription medicines only as told by your health care provider.  Do exercises as told by your health care provider.  Return to your normal activities as told by your health care provider. Ask your health care provider what activities are safe for you.  Keep all follow-up visits as told by your health care provider. This is important. How is this prevented?  Warm up and stretch before being active.  Cool down and stretch after being active.  Apply ice right after intense periods of activity to lessen inflammation.  Give your body time to rest between periods of activity.  Maintain your strength and flexibility through regular physical fitness. Contact a health care provider if:  Your pain and tenderness continue or get worse after treatment. Summary  A sports hernia is a type of soft tissue injury that  causes groin pain. There is no visible bulge with this injury.  It is a common overuse injury in athletes who play sports that involve a lot of repetitive kicking and twisting motions or sudden changes in direction.  Groin pain, that worsens with activity and improves with rest, is the main symptom of this condition.  Treatment includes rest, ice, medicine, physical therapy, and surgery if needed. This information is not  intended to replace advice given to you by your health care provider. Make sure you discuss any questions you have with your health care provider. Document Released: 03/06/2005 Document Revised: 06/27/2018 Document Reviewed: 03/01/2018 Elsevier Patient Education  2020 [employer].

## 2019-02-10 NOTE — Progress Notes (Signed)
    Subjective:    CC: Right hip pain  HPI: Nicole Owens is a 38 year old woman who presents to clinic today for right hip pain fpr 2 weeks. Was performing reverse lunge. Felt a pull in her groin. Was doing scissor kicks prior to the lunge and felt some pain with that exercise as well. Pain was sharp with the lunge. Was able to continue working out but modified the workout. Has been resting for 10 days. Does feel that she is having improvement but does not pain with ascending stairs quickly, adducting leg, and truck flexion. Pain is better with movement but stiffens up with prolonged sitting or lying. No issues with sleeping.  Pain located at anterior groin to the right and lateral of the pubic bone.  No pain at rest.  Pain worse with activity.  Pain can be quite severe at times.  Pain has limited her ability to exercise normally.  Past medical history, Surgical history, Family history not pertinant except as noted below, Social history, Allergies, and medications have been entered into the medical record, reviewed, and no changes needed.   Review of Systems: No headache, visual changes, nausea, vomiting, diarrhea, constipation, dizziness, abdominal pain, skin rash, fevers, chills, night sweats, weight loss, swollen lymph nodes, body aches, joint swelling, muscle aches, chest pain, shortness of breath, mood changes, visual or auditory hallucinations.   Objective:    Vitals:   02/10/19 1424  BP: 138/90  Pulse: 90  SpO2: 97%   General: Well Developed, well nourished, and in no acute distress.  Neuro/Psych: Alert and oriented x3, extra-ocular muscles intact, able to move all 4 extremities, sensation grossly intact. Skin: Warm and dry, no rashes noted.  Respiratory: Not using accessory muscles, speaking in full sentences, trachea midline.  Cardiovascular: Pulses palpable, no extremity edema. Abdomen: Does not appear distended. MSK:  Abdomen: Normal-appearing.  Tender palpation at insertion of  rectus femoris and oblique muscle onto pubic bone especially worse with contraction of these muscles. Pain reproduced with abdominal flexion and sit up position. Right hip: Normal-appearing normal motion.  Tender palpation at hip adductor insertion. Pain reproduced with resisted hip flexion and adduction. Hip strength intact and pain-free to abduction external rotation. Normal gait.    Impression and Recommendations:    Assessment and Plan: 38 y.o. female with pain at hip adductor and pubic area consistent with sports hernia.  Plan to proceed with trial of physical therapy.  Will check back in 4 weeks if not improving next step will be MRI.  Discussed precautions recheck sooner if needed.Marland Kitchen  PDMP not reviewed this encounter. Orders Placed This Encounter  Procedures  . Ambulatory referral to Physical Therapy    Referral Priority:   Routine    Referral Type:   Physical Medicine    Referral Reason:   Specialty Services Required    Requested Specialty:   Physical Therapy   No orders of the defined types were placed in this encounter.   Discussed warning signs or symptoms. Please see discharge instructions. Patient expresses understanding.   The above documentation has been reviewed and is accurate and complete Lynne Leader

## 2019-02-25 ENCOUNTER — Ambulatory Visit: Payer: BC Managed Care – PPO

## 2019-03-17 ENCOUNTER — Ambulatory Visit: Payer: BC Managed Care – PPO | Admitting: Family Medicine

## 2019-03-17 NOTE — Progress Notes (Deleted)
Nicole Owens is a 38 y.o. female who presents to [place] at [facility] today for follow-up sports hernia.  Patient was seen about a month ago on November 23 for hip pain ongoing at that point for about 2 weeks.  Pain was thought to be at hip adductor and pubic area consistent with sports hernia.  We plan to proceed with trial of physical therapy and check back.  In the interim she notes ***    ROS:  As above  Exam:  There were no vitals taken for this visit. Wt Readings from Last 5 Encounters:  02/10/19 149 lb (67.6 kg)  08/01/17 149 lb 6.4 oz (67.8 kg)  10/07/15 165 lb 9.6 oz (75.1 kg)  04/16/15 172 lb (78 kg)  04/07/14 167 lb (75.8 kg)   General: Well Developed, well nourished, and in no acute distress.  Neuro/Psych: Alert and oriented x3, extra-ocular muscles intact, able to move all 4 extremities, sensation grossly intact. Skin: Warm and dry, no rashes noted.  Respiratory: Not using accessory muscles, speaking in full sentences, trachea midline.  Cardiovascular: Pulses palpable, no extremity edema. Abdomen: Does not appear distended. MSK: ***    Lab and Radiology Results No results found for this or any previous visit (from the past 72 hour(s)). No results found.     Assessment and Plan: 38 y.o. female with ***   PDMP not reviewed this encounter. No orders of the defined types were placed in this encounter.  No orders of the defined types were placed in this encounter.   Historical information moved to improve visibility of documentation.  Past Medical History:  Diagnosis Date  . Asthma   . Asthma, intermittent    mild  . Carpal tunnel syndrome on both sides    with pregnancy  . Female infertility 04/30/2010   history restored after error with record merge  . Heartburn in pregnancy    nausea  . 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 Postpartum care following cesarean delivery 02/17/2012  . Rh negative, maternal  02/17/2012  . S/P cesarean section - triplets 11/29 02/17/2012   Past Surgical History:  Procedure Laterality Date  . CESAREAN SECTION  02/16/2012   Procedure: CESAREAN SECTION;  Surgeon: Lovenia Kim, MD;  Location: [facility];  Service: Obstetrics;  Laterality: N/A;  . NO PAST SURGERIES    . WISDOM TOOTH EXTRACTION     Social History   Tobacco Use  . Smoking status: Never Smoker  . Smokeless tobacco: Never Used  Substance Use Topics  . Alcohol use: Yes    Comment: social but none while pregnant   family history includes Hypertension in her father; Obesity in her sister.  Medications: Current Outpatient Medications  Medication Sig Dispense Refill  . albuterol (VENTOLIN HFA) 108 (90 Base) MCG/ACT inhaler INHALE TWO PUFFS BY MOUTH THREE TIMES DAILY AS NEEDED FOR WHEEZING OR  SHORTNESS  OF  BREATH 18 each 5  . budesonide-formoterol (SYMBICORT) 160-4.5 MCG/ACT inhaler Inhale 2 puffs into the lungs 2 (two) times daily. 1 Inhaler 0  . budesonide-formoterol (SYMBICORT) 160-4.5 MCG/ACT inhaler Inhale 2 puffs by mouth twice daily 33 g 0   No current facility-administered medications for this visit.   Allergies  Allergen Reactions  . Aspirin Shortness Of Breath    Can trigger asthma, told nurse ibuprofen makes her feel odd and only takes one 200mg  tablet if needed      Discussed warning signs or symptoms. Please see discharge  instructions. Patient expresses understanding.  ***

## 2019-03-19 ENCOUNTER — Ambulatory Visit: Payer: BC Managed Care – PPO | Attending: Family Medicine

## 2019-03-19 ENCOUNTER — Other Ambulatory Visit: Payer: Self-pay

## 2019-03-19 DIAGNOSIS — M25551 Pain in right hip: Secondary | ICD-10-CM | POA: Diagnosis not present

## 2019-03-19 DIAGNOSIS — M6281 Muscle weakness (generalized): Secondary | ICD-10-CM | POA: Diagnosis not present

## 2019-03-19 NOTE — Patient Instructions (Signed)
Seated R hip flexion isometrics   Sitting on a chair, with your right hand on your right thigh    Gently press your right thigh against your right hand while your right hand resists the movement    40% effort    Perform for 1 minute     Rest for 1-2 minutes       Repeat 5 times       Perform 3 sessions daily.    Seated hip adduction isometrics   Sitting on a chair, gently squeeze a folded pillow or a ball between your knees at 40% effort   Hold for 1 minute   Rest for 1-2 minutes   Repeat 5 times    Perform 3 sessions daily       Do 3 sets per day. Each set to be performed a few hours apart.

## 2019-03-19 NOTE — Therapy (Signed)
[facility] PHYSICAL AND SPORTS MEDICINE [address] [place], [place], [address] Phone: [phone]   Fax:  [phone]  Physical Therapy Evaluation  Patient Details  Name: Nicole Owens MRN: [id number] Date of Birth: 04-27-1980 Referring Provider (PT): Clementeen GrahamEvan Corey, MD   Encounter Date: 03/19/2019  PT End of Session - 03/19/19 0845    Visit Number  1    Number of Visits  13    Date for PT Re-Evaluation  05/01/19    PT Start Time  0845    PT Stop Time  0942    PT Time Calculation (min)  57 min    Activity Tolerance  Patient tolerated treatment well    Behavior During Therapy  [facility] for tasks assessed/performed       Past Medical History:  Diagnosis Date  . Asthma   . Asthma, intermittent    mild  . Carpal tunnel syndrome on both sides    with pregnancy  . Female infertility 04/30/2010   history restored after error with record merge  . Heartburn in pregnancy    nausea  . 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 Postpartum care following cesarean delivery 02/17/2012  . Rh negative, maternal 02/17/2012  . S/P cesarean section - triplets 11/29 02/17/2012    Past Surgical History:  Procedure Laterality Date  . CESAREAN SECTION  02/16/2012   Procedure: CESAREAN SECTION;  Surgeon: Lenoard Adenichard J Taavon, MD;  Location: [facility];  Service: Obstetrics;  Laterality: N/A;  . NO PAST SURGERIES    . WISDOM TOOTH EXTRACTION      There were no vitals filed for this visit.   Subjective Assessment - 03/19/19 0849    Subjective  R hip pain: 3/10 currently (pt sitting), 6/10 at worst for the past 6 months.    Pertinent History  R hip pain. Pain began mid November 2020. Pt was at the gym. Pt was performing scissor kicks. Felt a little pain more than normal. Went for a backward lunge with R LE and felt sharp pain R lower abdomen. Took 10 days off and iced that area. Did not really get better. Was diagnosed with sports hernia. Still getting pain but  getting a little better. Could not jog before 6 weeks ago. Thinks she can jog slowly now. Using her R LE to stand up from a R half kneeling position bothers her. Pt goes to the gym 6 days a week before, no goes to the gym 1 time a week and performs modified upper body work out. Pivoting the wrong way sometimes would hurt. Going up the steps 2 at a time bothers her R hip. Getting out of bed involving SLR hip flexion movement would bother it. Coughing, sneezing would bother R hip. Had C section 7 years ago for triplets. The pain is along the line of the incision.    Patient Stated Goals  Anything in the lunge position, be able to run (pt runs half marathons)    Currently in Pain?  Yes    Pain Score  3     Pain Location  Hip    Pain Orientation  Right;Anterior    Pain Type  Acute pain    Pain Onset  More than a month ago    Pain Frequency  Occasional    Aggravating Factors   see history    Pain Relieving Factors  rest, ice helps temporarily.         [facility] Assessment -  03/19/19 0848      Assessment   Medical Diagnosis  R hip pain    Referring Provider (PT)  Lynne Leader, MD    Onset Date/Surgical Date  02/10/19   Date PT referral signed   Prior Therapy  No known PT for current condition      Precautions   Precaution Comments  No known precautions      Restrictions   Other Position/Activity Restrictions  No known restrictions      Balance Screen   Has the patient fallen in the past 6 months  No    Has the patient had a decrease in activity level because of a fear of falling?   No    Is the patient reluctant to leave their home because of a fear of falling?   No      Prior Function   Vocation Requirements  Independent, no limitations    Leisure  run, exercise      Observation/Other Assessments   Observations  reproduction of symptoms with sit up and resisted R hip adduction isometrics       Posture/Postural Control   Posture Comments  L scapular protraction, R iliac crest,  greater trochanter,  and knee lower.  Slight R foot pronation compared to L       AROM   Lumbar Flexion  full    Lumbar Extension  WFL    Lumbar - Right Side Bend  full    Lumbar - Left Side Bend  limited with R groin tightness    Lumbar - Right Rotation  WFL    Lumbar - Left Rotation  WFL      PROM   Overall PROM Comments  WFL B hip IR and ER PROM in 90/90 position      Strength   Right Hip Flexion  4/5   with R groin pain   Right Hip Extension  4-/5   slight R hip pain   Right Hip ABduction  5/5    Right Hip ADduction  3-/5   with groin symptoms   Left Hip Flexion  4+/5    Left Hip Extension  4/5    Left Hip ABduction  4+/5    Left Hip ADduction  4+/5    Right Knee Flexion  5/5    Right Knee Extension  5/5    Left Knee Flexion  5/5    Left Knee Extension  5/5      Palpation   Palpation comment  TTP distal rectus abdominis and oblique muscle insertion with reproduction of symptoms with muscle activation.       Special Tests   Other special tests  (+) long sit test R LE suggesting posterior nutation or R innominate.       Ambulation/Gait   Gait Comments  R femoral IR and adduction during stance phase > L LE. B pelvic drop                 Objective measurements completed on examination: See above findings.    No latex allergies    Therapeutic exercise  Isometric hip flexion R at 40% effort for 1 min with 1 min rest breaks 4x  Seated isometric hip adduction ball squeeze at 40% effort with 1 min holds and 1 minute rest breaks in between for 2x  Reviewed HEP. Pt demonstrated and verbalized understanding. Handout provided.    Improved exercise technique, movement at target joints, use of target muscles after mod verbal, visual,  tactile cues.   Response to treatment Pt tolerated session well without aggravation of symptoms.    Clinical presentation   Pt is a 38 year old female who came to physical therapy secondary to R hip pain. She also presents with  altered posture, decreased R hip strength, positive special test suggesting lumbopelvic involvement, reproduction of symptoms with activation of R hip flexor, adductor and abdominal muscles, TTP R distal rectus abdominis, and hip flexor muscles, and difficulty performing functional tasks such as standing up from a half kneeling position, pivoting, and jogging. Pt will benefit from skilled physical therapy services to address the aforementioned deficits.        PT Education - 03/19/19 1424    Education Details  ther-ex, HEP, plan of care    Person(s) Educated  Patient    Methods  Explanation;Demonstration;Tactile cues;Verbal cues;Handout    Comprehension  Returned demonstration;Verbalized understanding       PT Short Term Goals - 03/19/19 1248      PT SHORT TERM GOAL #1   Title  Patient will be independent with her HEP to decrease pain, improve strength and function.    Baseline  Pt has started her HEP (03/19/2019)    Time  3    Period  Weeks    Status  New    Target Date  04/10/19        PT Long Term Goals - 03/19/19 1251      PT LONG TERM GOAL #1   Title  Pt will have a decrease in R hip pain to 2/10 or less at worst to promote ability to run, perform exercises, stand up from a half kneeling position more comfortably.    Baseline  6/10 R hip pain at most (03/19/2019)    Time  6    Period  Weeks    Status  New    Target Date  05/01/19      PT LONG TERM GOAL #2   Title  Patient will improve R hip flexion, extension, abduction, and adduction strength by at least 1/2 MMT grade to promote ability to perform functional tasks more comfortably.    Time  6    Period  Weeks    Status  New    Target Date  05/01/19      PT LONG TERM GOAL #3   Title  Pt will be able to jog for at least 5 minutes without R hip pain to promote ability to participate in exercise and promote physical health    Baseline  increased R hip pain when jogging or running (03/19/2019)    Time  6    Period   Weeks    Status  New    Target Date  05/01/19             Plan - 03/19/19 1243    Clinical Impression Statement  Pt is a 38 year old female who came to physical therapy secondary to R hip pain. She also presents with altered posture, decreased R hip strength, positive special test suggesting lumbopelvic involvement, reproduction of symptoms with activation of R hip flexor, adductor and abdominal muscles, TTP R distal rectus abdominis, and hip flexor muscles, and difficulty performing functional tasks such as standing up from a half kneeling position, pivoting, and jogging. Pt will benefit from skilled physical therapy services to address the aforementioned deficits.    Personal Factors and Comorbidities  Comorbidity 1    Comorbidities  hx of C-section  Examination-Activity Limitations  Squat    Stability/Clinical Decision Making  Stable/Uncomplicated    Clinical Decision Making  Low    Rehab Potential  Good    PT Frequency  2x / week    PT Duration  6 weeks    PT Treatment/Interventions  Therapeutic activities;Therapeutic exercise;Neuromuscular re-education;Patient/family education;Manual techniques;Dry needling;Spinal Manipulations;Joint Manipulations;Aquatic Therapy;Electrical Stimulation;Iontophoresis /ml Dexamethasone;Ultrasound    PT Next Visit Plan  isometric, then concentric, then eccentric hip and abdominal strengthening, manual techniques, modalities PRN    Consulted and Agree with Plan of Care  Patient       Patient will benefit from skilled therapeutic intervention in order to improve the following deficits and impairments:  Pain, Improper body mechanics, Postural dysfunction, Decreased strength  Visit Diagnosis: Pain in right hip - Plan: PT plan of care cert/re-cert  Muscle weakness (generalized) - Plan: PT plan of care cert/re-cert     Problem List Patient Active Problem List   Diagnosis Date Noted  . S/P cesarean section - triplets 11/29 02/17/2012  .  Postpartum care following cesarean delivery (11/29) 02/17/2012  . Rh negative, maternal 02/17/2012  . 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 Routine general medical examination at a health care facility 06/23/2010  . FIBROCYSTIC BREAST DISEASE 07/17/2008  . Asthma, 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    Loralyn Freshwater PT, DPT   03/19/2019, 2:30 PM  [facility] PHYSICAL AND SPORTS MEDICINE [address], [place], [address] Phone: [phone]   Fax:  [phone]  Name: ARLEY GARANT MRN: [id number] Date of Birth: 1980-07-29

## 2019-03-24 ENCOUNTER — Ambulatory Visit: Payer: BC Managed Care – PPO | Attending: Family Medicine

## 2019-03-24 ENCOUNTER — Other Ambulatory Visit: Payer: Self-pay

## 2019-03-24 DIAGNOSIS — M25551 Pain in right hip: Secondary | ICD-10-CM | POA: Insufficient documentation

## 2019-03-24 DIAGNOSIS — M6281 Muscle weakness (generalized): Secondary | ICD-10-CM

## 2019-03-24 NOTE — Therapy (Signed)
[facility] [address], [place], [address] Phone: [phone]   Fax:  [phone]  Physical Therapy Treatment  Patient Details  Name: Nicole Owens MRN: [id number] Date of Birth: 14-Nov-1980 Referring Provider (PT): [name], MD   Encounter Date: 03/24/2019  PT End of Session - 03/24/19 1029    Visit Number  2    Number of Visits  13    Date for PT Re-Evaluation  05/01/19    PT Start Time  1030    PT Stop Time  1115    PT Time Calculation (min)  45 min    Activity Tolerance  Patient tolerated treatment well    Behavior During Therapy  [facility] for tasks assessed/performed       Past Medical History:  Diagnosis Date  . Asthma   . Asthma, intermittent    mild  . Carpal tunnel syndrome on both sides    with pregnancy  . Female infertility 04/30/2010   history restored after error with record merge  . Heartburn in pregnancy    nausea  . 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 Postpartum care following cesarean delivery 02/17/2012  . Rh negative, maternal 02/17/2012  . S/P cesarean section - triplets 11/29 02/17/2012    Past Surgical History:  Procedure Laterality Date  . CESAREAN SECTION  02/16/2012   Procedure: CESAREAN SECTION;  Surgeon: Lenoard Aden, MD;  Location: [facility];  Service: Obstetrics;  Laterality: N/A;  . NO PAST SURGERIES    . WISDOM TOOTH EXTRACTION      There were no vitals filed for this visit.  Subjective Assessment - 03/24/19 1033    Subjective  No reported pain at start of session.    Pertinent History  R hip pain. Pain began mid November 2020. Pt was at the gym. Pt was performing scissor kicks. Felt a little pain more than normal. Went for a backward lunge with R LE and felt sharp pain R lower abdomen. Took 10 days off and iced that area. Did not really get better. Was diagnosed with sports hernia. Still getting pain but getting a little better. Could not jog before 6  weeks ago. Thinks she can jog slowly now. Using her R LE to stand up from a R half kneeling position bothers her. Pt goes to the gym 6 days a week before, no goes to the gym 1 time a week and performs modified upper body work out. Pivoting the wrong way sometimes would hurt. Going up the steps 2 at a time bothers her R hip. Getting out of bed involving SLR hip flexion movement would bother it. Coughing, sneezing would bother R hip. Had C section 7 years ago for triplets. The pain is along the line of the incision.    Patient Stated Goals  Anything in the lunge position, be able to run (pt runs half marathons)    Currently in Pain?  No/denies       TREATMENT:  Objective measurements completed on examination: See above findings.     No latex allergies   Therapeutic exercise   Isometric hip flexion R at 40% effort for 1 min with 1 min rest breaks 5x   Seated isometric hip adduction ball squeeze at 40% effort with 1 min holds and 1 minute rest breaks in between for x5  Seated isometric hip abduction at 40% effort with 1 min holds and 1 minute rest break x3 (gait belt  around knees)  Seated transverse abdominis activation x15, with glute squeeze  Added TrA activation to HEP   Improved exercise technique, movement at target joints, use of target muscles after mod verbal, visual, tactile cues.    Response to treatment Pt tolerated session well without aggravation of symptoms.      Clinical presentation    The patient tolerated session well today, no complaints of pain. The patient was most challenged by transverse abdominis activation, encouraged to perform with HEP. The patient also stated she is performing arm day and daily walks, is interested in further guidance on current activities. The patient would benefit from further skilled PT intervention to progress towards goals.     PT Education - 03/24/19 1029    Education Details  therapeutic exercise form/techique    Person(s) Educated   Patient    Methods  Explanation;Demonstration;Tactile cues;Handout    Comprehension  Verbalized understanding;Returned demonstration       PT Short Term Goals - 03/19/19 1248      PT SHORT TERM GOAL #1   Title  Patient will be independent with her HEP to decrease pain, improve strength and function.    Baseline  Pt has started her HEP (03/19/2019)    Time  3    Period  Weeks    Status  New    Target Date  04/10/19        PT Long Term Goals - 03/19/19 1251      PT LONG TERM GOAL #1   Title  Pt will have a decrease in R hip pain to 2/10 or less at worst to promote ability to run, perform exercises, stand up from a half kneeling position more comfortably.    Baseline  6/10 R hip pain at most (03/19/2019)    Time  6    Period  Weeks    Status  New    Target Date  05/01/19      PT LONG TERM GOAL #2   Title  Patient will improve R hip flexion, extension, abduction, and adduction strength by at least 1/2 MMT grade to promote ability to perform functional tasks more comfortably.    Time  6    Period  Weeks    Status  New    Target Date  05/01/19      PT LONG TERM GOAL #3   Title  Pt will be able to jog for at least 5 minutes without R hip pain to promote ability to participate in exercise and promote physical health    Baseline  increased R hip pain when jogging or running (03/19/2019)    Time  6    Period  Weeks    Status  New    Target Date  05/01/19            Plan - 03/24/19 1034    Clinical Impression Statement  The patient tolerated session well today, no complaints of pain. The patient was most challenged by transverse abdominis activation, encouraged to perform with HEP. The patient also stated she is performing arm day and daily walks, is interested in further guidance on current activities. The patient would benefit from further skilled PT intervention to progress towards goals.    Personal Factors and Comorbidities  Comorbidity 1    Comorbidities  hx of  C-section    Examination-Activity Limitations  Squat    Rehab Potential  Good    PT Frequency  2x / week    PT  Duration  6 weeks    PT Treatment/Interventions  Therapeutic activities;Therapeutic exercise;Neuromuscular re-education;Patient/family education;Manual techniques;Dry needling;Spinal Manipulations;Joint Manipulations;Aquatic Therapy;Electrical Stimulation;Iontophoresis 4mg /ml Dexamethasone;Ultrasound    PT Next Visit Plan  isometric, then concentric, then eccentric hip and abdominal strengthening, manual techniques, modalities PRN    Consulted and Agree with Plan of Care  Patient       Patient will benefit from skilled therapeutic intervention in order to improve the following deficits and impairments:  Pain, Improper body mechanics, Postural dysfunction, Decreased strength  Visit Diagnosis: Pain in right hip  Muscle weakness (generalized)     Problem List Patient Active Problem List   Diagnosis Date Noted  . S/P cesarean section - triplets 11/29 02/17/2012  . Postpartum care following cesarean delivery (11/29) 02/17/2012  . Rh negative, maternal 02/17/2012  . 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 Routine general medical examination at a health care facility 06/23/2010  . FIBROCYSTIC BREAST DISEASE 07/17/2008  . Asthma, 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    06/19/2008 PT, DPT 12:42 PM,03/24/19    [facility] PHYSICAL AND SPORTS MEDICINE [address], [address], [place] Phone: [phone]   Fax:  [phone]  Name: Nicole Owens MRN: [id number] Poke Date of Birth: 06/10/1980

## 2019-03-26 ENCOUNTER — Ambulatory Visit: Payer: BC Managed Care – PPO

## 2019-03-26 ENCOUNTER — Other Ambulatory Visit: Payer: Self-pay

## 2019-03-26 DIAGNOSIS — M25551 Pain in right hip: Secondary | ICD-10-CM

## 2019-03-26 DIAGNOSIS — M6281 Muscle weakness (generalized): Secondary | ICD-10-CM | POA: Diagnosis not present

## 2019-03-26 NOTE — Patient Instructions (Signed)
Access Code: [email or web address]  URL: [email or web address]  Date: 03/26/2019  Prepared by: Loralyn Freshwater   Exercises Seated Hip Flexion - 10 reps - 3 sets - 1x daily - 7x weekly Supine Bridge with Mini Swiss Ball Between Knees - 10 reps - 3 sets - 5 seconds hold - 1x daily - 7x weekly

## 2019-03-26 NOTE — Therapy (Signed)
[facility] [address] [place], [address] Phone: [phone]   Fax:  [phone]  Physical Therapy Treatment  Patient Details  Name: Nicole Owens MRN: [id number] Date of Birth: Mar 17, 1981 Referring Provider (PT): Lynne Leader, MD   Encounter Date: 03/26/2019  PT End of Session - 03/26/19 0806    Visit Number  3    Number of Visits  13    Date for PT Re-Evaluation  05/01/19    PT Start Time  0806    PT Stop Time  0846    PT Time Calculation (min)  40 min    Activity Tolerance  Patient tolerated treatment well    Behavior During Therapy  [facility] for tasks assessed/performed       Past Medical History:  Diagnosis Date  . Asthma   . Asthma, intermittent    mild  . Carpal tunnel syndrome on both sides    with pregnancy  . Female infertility 04/30/2010   history restored after error with record merge  . Heartburn in pregnancy    nausea  . 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 Postpartum care following cesarean delivery 02/17/2012  . Rh negative, maternal 02/17/2012  . S/P cesarean section - triplets 11/29 02/17/2012    Past Surgical History:  Procedure Laterality Date  . CESAREAN SECTION  02/16/2012   Procedure: CESAREAN SECTION;  Surgeon: Lovenia Kim, MD;  Location: [facility];  Service: Obstetrics;  Laterality: N/A;  . NO PAST SURGERIES    . WISDOM TOOTH EXTRACTION      There were no vitals filed for this visit.  Subjective Assessment - 03/26/19 0807    Subjective  R hip is good, no pain. R hip feels the same. Getting off the couch or bed still hurts, not as bad as it was 6 weeks ago but still notices it so its about the same.    Pertinent History  R hip pain. Pain began mid November 2020. Pt was at the gym. Pt was performing scissor kicks. Felt a little pain more than normal. Went for a backward lunge with R LE and felt sharp pain R lower abdomen. Took 10 days off and iced that area. Did  not really get better. Was diagnosed with sports hernia. Still getting pain but getting a little better. Could not jog before 6 weeks ago. Thinks she can jog slowly now. Using her R LE to stand up from a R half kneeling position bothers her. Pt goes to the gym 6 days a week before, no goes to the gym 1 time a week and performs modified upper body work out. Pivoting the wrong way sometimes would hurt. Going up the steps 2 at a time bothers her R hip. Getting out of bed involving SLR hip flexion movement would bother it. Coughing, sneezing would bother R hip. Had C section 7 years ago for triplets. The pain is along the line of the incision.    Patient Stated Goals  Anything in the lunge position, be able to run (pt runs half marathons)    Currently in Pain?  No/denies                               PT Education - 03/26/19 0814    Education Details  ther-ex    Person(s) Educated  Patient    Methods  Explanation;Demonstration;Tactile cues;Verbal cues  Comprehension  Returned demonstration;Verbalized understanding      Objective measurements completed on examination: See above findings.  No latex allergies    Medbridge Access Code: [email or web address]    Therapeutic exercise  Seated R hip flexion, no pain  Seated R flexion 10x3   Standing R hip adduction, R foot on slider, pain free range and level of effort  10x3  Standing R hip abduction 10x5 seconds for 3 sets  Bridge with hip adductor ball squeeze(comfortable level of effort) 10x3 with 5 second holds   Supine transversus abdominis contraction 10x3 with 5 second holds  Supine march 10x  Supine R LE SLR hip flexion 1x.   R hip discomfort.   Supine hip abduction/adduction with foot on sliding board. 10x3  3/10 discomfort/tolerable.   Supine heel slides with R foot on furniture slider 10x3  Seated B shoulder extension isometrics 10x3 with 5 second holds to promote abdominal muscle activation  standing  hip flexion SLR R LE 10x3 to promote concentric contraction of R hip flexor muscles   SLS R LE 10x5 seconds   For isometric contraction of R hip adductor muscles in closed chain position.   Improved exercise technique, movement at target joints, use of target muscles after min to mod verbal, visual, tactile cues.    Response to treatment Pt tolerated session well without aggravation of symptoms.    Clinical presentation Able to contract R hip flexor muscles concentrically without pain in sitting and adductor muscles with R foot on furniture slider in standing. Uncomfortable in supine. Added conccentric muscle contractions in comfortable level of effort to exercises to continue strengthening and promoting proper stress to healing tissues. Pt will benefit from continued skilled physical therapy services to decrease pain, improve strength and function.   PT Short Term Goals - 03/19/19 1248      PT SHORT TERM GOAL #1   Title  Patient will be independent with her HEP to decrease pain, improve strength and function.    Baseline  Pt has started her HEP (03/19/2019)    Time  3    Period  Weeks    Status  New    Target Date  04/10/19        PT Long Term Goals - 03/19/19 1251      PT LONG TERM GOAL #1   Title  Pt will have a decrease in R hip pain to 2/10 or less at worst to promote ability to run, perform exercises, stand up from a half kneeling position more comfortably.    Baseline  6/10 R hip pain at most (03/19/2019)    Time  6    Period  Weeks    Status  New    Target Date  05/01/19      PT LONG TERM GOAL #2   Title  Patient will improve R hip flexion, extension, abduction, and adduction strength by at least 1/2 MMT grade to promote ability to perform functional tasks more comfortably.    Time  6    Period  Weeks    Status  New    Target Date  05/01/19      PT LONG TERM GOAL #3   Title  Pt will be able to jog for at least 5 minutes without R hip pain to promote  ability to participate in exercise and promote physical health    Baseline  increased R hip pain when jogging or running (03/19/2019)    Time  6  Period  Weeks    Status  New    Target Date  05/01/19            Plan - 03/26/19 0814    Clinical Impression Statement  Able to contract R hip flexor muscles concentrically without pain in sitting and adductor muscles with R foot on furniture slider in standing. Uncomfortable in supine. Added conccentric muscle contractions in comfortable level of effort to exercises to continue strengthening and promoting proper stress to healing tissues. Pt will benefit from continued skilled physical therapy services to decrease pain, improve strength and function.    Personal Factors and Comorbidities  Comorbidity 1    Comorbidities  hx of C-section    Examination-Activity Limitations  Squat    Rehab Potential  Good    PT Frequency  2x / week    PT Duration  6 weeks    PT Treatment/Interventions  Therapeutic activities;Therapeutic exercise;Neuromuscular re-education;Patient/family education;Manual techniques;Dry needling;Spinal Manipulations;Joint Manipulations;Aquatic Therapy;Electrical Stimulation;Iontophoresis 4mg /ml Dexamethasone;Ultrasound    PT Next Visit Plan  isometric, then concentric, then eccentric hip and abdominal strengthening, manual techniques, modalities PRN    Consulted and Agree with Plan of Care  Patient       Patient will benefit from skilled therapeutic intervention in order to improve the following deficits and impairments:  Pain, Improper body mechanics, Postural dysfunction, Decreased strength  Visit Diagnosis: Pain in right hip  Muscle weakness (generalized)     Problem List Patient Active Problem List   Diagnosis Date Noted  . S/P cesarean section - triplets 11/29 02/17/2012  . Postpartum care following cesarean delivery (11/29) 02/17/2012  . Rh negative, maternal 02/17/2012  . Multiple gestation - triplets  08/10/2011  . Routine general medical examination at a health care facility 06/23/2010  . FIBROCYSTIC BREAST DISEASE 07/17/2008  . Asthma, 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    06/19/2008 PT, DPT   03/26/2019, 2:13 PM  [facility] [address], [place] Phone: [phone]   Fax:  [phone]  Name: Nicole Owens MRN: Darrol Poke Date of Birth: 1981-01-01

## 2019-03-27 ENCOUNTER — Ambulatory Visit: Payer: BC Managed Care – PPO

## 2019-03-31 ENCOUNTER — Ambulatory Visit: Payer: BC Managed Care – PPO

## 2019-04-02 ENCOUNTER — Ambulatory Visit: Payer: BC Managed Care – PPO

## 2019-04-02 ENCOUNTER — Other Ambulatory Visit: Payer: Self-pay

## 2019-04-02 DIAGNOSIS — M25551 Pain in right hip: Secondary | ICD-10-CM

## 2019-04-02 DIAGNOSIS — M6281 Muscle weakness (generalized): Secondary | ICD-10-CM

## 2019-04-02 NOTE — Therapy (Signed)
[facility] [address], [place], [address] Phone: [phone]   Fax:  [phone]  Physical Therapy Treatment  Patient Details  Name: MCCARTNEY BRUCKS MRN: [id number] Date of Birth: Nov 07, 1980 Referring Provider (PT): Lynne Leader, MD   Encounter Date: 04/02/2019  PT End of Session - 04/02/19 0805    Visit Number  4    Number of Visits  13    Date for PT Re-Evaluation  05/01/19    PT Start Time  0805    PT Stop Time  4163    PT Time Calculation (min)  42 min    Activity Tolerance  Patient tolerated treatment well    Behavior During Therapy  [facility] for tasks assessed/performed       Past Medical History:  Diagnosis Date  . Asthma   . Asthma, intermittent    mild  . Carpal tunnel syndrome on both sides    with pregnancy  . Female infertility 04/30/2010   history restored after error with record merge  . Heartburn in pregnancy    nausea  . 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 Postpartum care following cesarean delivery 02/17/2012  . Rh negative, maternal 02/17/2012  . S/P cesarean section - triplets 11/29 02/17/2012    Past Surgical History:  Procedure Laterality Date  . CESAREAN SECTION  02/16/2012   Procedure: CESAREAN SECTION;  Surgeon: Lovenia Kim, MD;  Location: [facility];  Service: Obstetrics;  Laterality: N/A;  . NO PAST SURGERIES    . WISDOM TOOTH EXTRACTION      There were no vitals filed for this visit.  Subjective Assessment - 04/02/19 0808    Subjective  R hip feels about the same. However, pt was able to jog/shuffle with her snow boots when it snowed and was about to do so. No pain currently.    Pertinent History  R hip pain. Pain began mid November 2020. Pt was at the gym. Pt was performing scissor kicks. Felt a little pain more than normal. Went for a backward lunge with R LE and felt sharp pain R lower abdomen. Took 10 days off and iced that area. Did not really get better.  Was diagnosed with sports hernia. Still getting pain but getting a little better. Could not jog before 6 weeks ago. Thinks she can jog slowly now. Using her R LE to stand up from a R half kneeling position bothers her. Pt goes to the gym 6 days a week before, no goes to the gym 1 time a week and performs modified upper body work out. Pivoting the wrong way sometimes would hurt. Going up the steps 2 at a time bothers her R hip. Getting out of bed involving SLR hip flexion movement would bother it. Coughing, sneezing would bother R hip. Had C section 7 years ago for triplets. The pain is along the line of the incision.    Patient Stated Goals  Anything in the lunge position, be able to run (pt runs half marathons)    Currently in Pain?  No/denies    Pain Score  0-No pain                               PT Education - 04/02/19 0811    Education Details  ther-ex, HEP    Person(s) Educated  Patient    Methods  Explanation;Demonstration;Tactile cues;Verbal cues;Handout  Comprehension  Returned demonstration;Verbalized understanding          Objective   No latex allergies    Medbridge Access Code: [email or web address]    Therapeutic exercise  Seated R hip flexion, no pain             Seated R flexion 10x3  Seated B shoulder extension isometrics, hands on thighs 10x5 seconds for 3 sets   Supine transversus abdominis contraction 10x3 with 5 second holds  Supine march 10x3  Supine hip fallouts 10x3  Bridge with gentle hip adductor ball squeeze 10x3  hooklying alternating leg extension 10x3   SLS R LE 10x5 seconds              For isometric contraction of R hip adductor muscles in closed chain position.    Improved exercise technique, movement at target joints, use of target muscles after min to mod verbal, visual, tactile cues.       Response to treatment Pt tolerated session well without aggravation of symptoms.    Clinical  presentation Pt demonstrates slight improvement in R distal abdominal and groin pain based on subjective reports of being able to jog/shuffle away from her kids while playing in the snow without pain. Continued working on gentle distal abdominal, hip flexor and adductor muscle activation, both concentric and eccentrically as appropriate to provide proper stress to healing tissues to promote proper healing. Pt tolerated session well without aggravation of symptoms. Pt will benefit from continued skilled physical therapy services to decrease pain, improve strength and function.     PT Short Term Goals - 03/19/19 1248      PT SHORT TERM GOAL #1   Title  Patient will be independent with her HEP to decrease pain, improve strength and function.    Baseline  Pt has started her HEP (03/19/2019)    Time  3    Period  Weeks    Status  New    Target Date  04/10/19        PT Long Term Goals - 03/19/19 1251      PT LONG TERM GOAL #1   Title  Pt will have a decrease in R hip pain to 2/10 or less at worst to promote ability to run, perform exercises, stand up from a half kneeling position more comfortably.    Baseline  6/10 R hip pain at most (03/19/2019)    Time  6    Period  Weeks    Status  New    Target Date  05/01/19      PT LONG TERM GOAL #2   Title  Patient will improve R hip flexion, extension, abduction, and adduction strength by at least 1/2 MMT grade to promote ability to perform functional tasks more comfortably.    Time  6    Period  Weeks    Status  New    Target Date  05/01/19      PT LONG TERM GOAL #3   Title  Pt will be able to jog for at least 5 minutes without R hip pain to promote ability to participate in exercise and promote physical health    Baseline  increased R hip pain when jogging or running (03/19/2019)    Time  6    Period  Weeks    Status  New    Target Date  05/01/19            Plan - 04/02/19 0812    Clinical Impression  Statement  Pt  demonstrates slight improvement in R distal abdominal and groin pain based on subjective reports of being able to jog/shuffle away from her kids while playing in the snow without pain. Continued working on gentle distal abdominal, hip flexor and adductor muscle activation, both concentric and eccentrically as appropriate to provide proper stress to healing tissues to promote proper healing. Pt tolerated session well without aggravation of symptoms. Pt will benefit from continued skilled physical therapy services to decrease pain, improve strength and function.    Personal Factors and Comorbidities  Comorbidity 1    Comorbidities  hx of C-section    Examination-Activity Limitations  Squat    Rehab Potential  Good    PT Frequency  2x / week    PT Duration  6 weeks    PT Treatment/Interventions  Therapeutic activities;Therapeutic exercise;Neuromuscular re-education;Patient/family education;Manual techniques;Dry needling;Spinal Manipulations;Joint Manipulations;Aquatic Therapy;Electrical Stimulation;Iontophoresis 4mg /ml Dexamethasone;Ultrasound    PT Next Visit Plan  isometric, then concentric, then eccentric hip and abdominal strengthening, manual techniques, modalities PRN    Consulted and Agree with Plan of Care  Patient       Patient will benefit from skilled therapeutic intervention in order to improve the following deficits and impairments:  Pain, Improper body mechanics, Postural dysfunction, Decreased strength  Visit Diagnosis: Pain in right hip  Muscle weakness (generalized)     Problem List Patient Active Problem List   Diagnosis Date Noted  . S/P cesarean section - triplets 11/29 02/17/2012  . Postpartum care following cesarean delivery (11/29) 02/17/2012  . Rh negative, maternal 02/17/2012  . 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 Routine general medical examination at a health care facility 06/23/2010  . FIBROCYSTIC BREAST DISEASE 07/17/2008  . Asthma, intrinsic  06/17/2008    06/19/2008 PT, DPT   04/02/2019, 8:58 AM  [facility] [address], [place] Phone: [phone]   Fax:  [phone]  Name: ADDYSEN LOUTH MRN: Darrol Poke Date of Birth: Apr 01, 1980

## 2019-04-02 NOTE — Patient Instructions (Addendum)
Seated bilateral shoulder extension isometrics    Sitting on a chair    Press your hands on your thighs to feel your abdominal muscles contract.   Hold for 5 seconds comfortably.   Repeat 10 times.   Perform 3 sets    Perform 3 sessions daily     Access Code: [email or web address]  URL: [email or web address]  Date: 04/02/2019  Prepared by: Loralyn Freshwater   Exercises Seated Hip Flexion - 10 reps - 3 sets - 1x daily - 7x weekly Supine Bridge with Mini Swiss Ball Between Knees - 10 reps - 3 sets - 5 seconds hold - 1x daily - 7x weekly Bent Knee Fallouts - 10 reps - 3 sets - 1x daily - 7x weekly Single Leg Stance with Support - 10 reps - 3 sets - 5 seconds hold - 3x daily - 7x weekly

## 2019-04-07 ENCOUNTER — Other Ambulatory Visit: Payer: Self-pay

## 2019-04-07 ENCOUNTER — Ambulatory Visit: Payer: BC Managed Care – PPO

## 2019-04-07 DIAGNOSIS — M6281 Muscle weakness (generalized): Secondary | ICD-10-CM | POA: Diagnosis not present

## 2019-04-07 DIAGNOSIS — M25551 Pain in right hip: Secondary | ICD-10-CM | POA: Diagnosis not present

## 2019-04-07 NOTE — Therapy (Signed)
[place] [facility] [facility] PHYS[facility] AND [facility]OR[facility] MEDICINE [address], [place], [address] Phone: [phone]   Fax:  [phone]  Physical Therapy Treatment  Patient Details  Name: Nicole Owens MRN: [id number] Date of Birth: September 03, 1980 Referring Provider (PT): [name], MD   Encounter Date: 04/07/2019  PT End of Session - 04/07/19 0804    Visit Number  5    Number of Visits  13    Date for PT Re-Evaluation  05/01/19    PT Start Time  0804    PT Stop Time  0844    PT Time Calculation (min)  40 min    Activity Tolerance  Patient tolerated treatment well    Behavior During Therapy  [facility] for tasks assessed/performed       Past Medical History:  Diagnosis Date  . Asthma   . Asthma, intermittent    mild  . Carpal tunnel syndrome on both sides    with pregnancy  . Female infertility 04/30/2010   history restored after error with record merge  . Heartburn in pregnancy    nausea  . 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 Postpartum care following cesarean delivery 02/17/2012  . Rh negative, maternal 02/17/2012  . S/P cesarean section - triplets 11/29 02/17/2012    Past Surgical History:  Procedure Laterality Date  . CESAREAN SECTION  02/16/2012   Procedure: CESAREAN SECTION;  Surgeon: Lenoard Aden, MD;  Location: [facility];  Service: Obstetrics;  Laterality: N/A;  . NO PAST SURGERIES    . WISDOM TOOTH EXTRACTION      There were no vitals filed for this visit.  Subjective Assessment - 04/07/19 0806    Subjective  R groin area is a little sore. Has been sore for a few days. Had 2 arm workout days this past weekend.    Pertinent History  R hip pain. Pain began mid November 2020. Pt was at the gym. Pt was performing scissor kicks. Felt a little pain more than normal. Went for a backward lunge with R LE and felt sharp pain R lower abdomen. Took 10 days off and iced that area. Did not really get better. Was diagnosed with sports hernia. Still  getting pain but getting a little better. Could not jog before 6 weeks ago. Thinks she can jog slowly now. Using her R LE to stand up from a R half kneeling position bothers her. Pt goes to the gym 6 days a week before, no goes to the gym 1 time a week and performs modified upper body work out. Pivoting the wrong way sometimes would hurt. Going up the steps 2 at a time bothers her R hip. Getting out of bed involving SLR hip flexion movement would bother it. Coughing, sneezing would bother R hip. Had C section 7 years ago for triplets. The pain is along the line of the incision.    Patient Stated Goals  Anything in the lunge position, be able to run (pt runs half marathons)    Currently in Pain?  No/denies   Just sore.                              PT Education - 04/07/19 0810    Education Details  ther-ex    Person(s) Educated  Patient    Methods  Explanation;Demonstration;Tactile cues;Verbal cues    Comprehension  Returned demonstration;Verbalized understanding      Objective  No latex allergies    MedbridgeAccess Code: [email or web address]   Therapeutic exercise  Seated R hip flexion, no pain Seated R flexion 10x3  Seated R hip ER 10x3  Seated R hip IR 10x3   manually resisted R hip flexion 4/5  No pain  Sit <> stand, emphasis on use or R glute 10x3 with B UE assist  Supine bridge with ball squeeze 10x, then 10x10 seconds, then with dyna disc under feet 10x5 seconds for 2 sets, pain free range   Supine hip fallouts 10x2 pain free range   Supine hip abduction/addiction, R foot on slick board 12W5  Supine heel slides (to promote concentric and eccentric R hp flexor muscle contraction), R foot on slick board 80D9  SLS R LE 20 seconds  Then with 1 kg ball toss 20x2    Improved exercise technique, movement at target joints, use of target muscles after min to mod verbal, visual, tactile cues.       Response to treatment Pt  tolerated session well without aggravation of symptoms.    Clinical presentation Pt able to resist R hip flexion MMT, being able to provide 4/5 strength without R groin pain today. Initially had pain with the MMT during initial evaluation. Continued with pain free isometric and concentric muscle contraction to continue to provide proper stress to healing tissues. Good muscle use felt with exercise. Pt tolerated session well without aggravation of symptoms. Pt will benefit from continued skilled physical therapy services to decrease pain, improve strength, and function.     PT Short Term Goals - 03/19/19 1248      PT SHORT TERM GOAL #1   Title  Patient will be independent with her HEP to decrease pain, improve strength and function.    Baseline  Pt has started her HEP (03/19/2019)    Time  3    Period  Weeks    Status  New    Target Date  04/10/19        PT Long Term Goals - 03/19/19 1251      PT LONG TERM GOAL #1   Title  Pt will have a decrease in R hip pain to 2/10 or less at worst to promote ability to run, perform exercises, stand up from a half kneeling position more comfortably.    Baseline  6/10 R hip pain at most (03/19/2019)    Time  6    Period  Weeks    Status  New    Target Date  05/01/19      PT LONG TERM GOAL #2   Title  Patient will improve R hip flexion, extension, abduction, and adduction strength by at least 1/2 MMT grade to promote ability to perform functional tasks more comfortably.    Time  6    Period  Weeks    Status  New    Target Date  05/01/19      PT LONG TERM GOAL #3   Title  Pt will be able to jog for at least 5 minutes without R hip pain to promote ability to participate in exercise and promote physical health    Baseline  increased R hip pain when jogging or running (03/19/2019)    Time  6    Period  Weeks    Status  New    Target Date  05/01/19            Plan - 04/07/19 0806    Clinical Impression Statement  Pt able  to  resist R hip flexion MMT, being able to provide 4/5 strength without R groin pain today. Initially had pain with the MMT during initial evaluation. Continued with pain free isometric and concentric muscle contraction to continue to provide proper stress to healing tissues. Good muscle use felt with exercise. Pt tolerated session well without aggravation of symptoms. Pt will benefit from continued skilled physical therapy services to decrease pain, improve strength, and function.    Personal Factors and Comorbidities  Comorbidity 1    Comorbidities  hx of C-section    Examination-Activity Limitations  Squat    Rehab Potential  Good    PT Frequency  2x / week    PT Duration  6 weeks    PT Treatment/Interventions  Therapeutic activities;Therapeutic exercise;Neuromuscular re-education;Patient/family education;Manual techniques;Dry needling;Spinal Manipulations;Joint Manipulations;Aquatic Therapy;Electrical Stimulation;Iontophoresis 4mg /ml Dexamethasone;Ultrasound    PT Next Visit Plan  isometric, then concentric, then eccentric hip and abdominal strengthening, manual techniques, modalities PRN    Consulted and Agree with Plan of Care  Patient       Patient will benefit from skilled therapeutic intervention in order to improve the following deficits and impairments:  Pain, Improper body mechanics, Postural dysfunction, Decreased strength  Visit Diagnosis: Pain in right hip  Muscle weakness (generalized)     Problem List Patient Active Problem List   Diagnosis Date Noted  . S/P cesarean section - triplets 11/29 02/17/2012  . Postpartum care following cesarean delivery (11/29) 02/17/2012  . Rh negative, maternal 02/17/2012  . 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 Routine general medical examination at a health care facility 06/23/2010  . FIBROCYSTIC BREAST DISEASE 07/17/2008  . Asthma, 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    06/19/2008 PT, DPT   04/07/2019, 8:54 AM  [place] [facility]  [facility] PHYSICAL AND SPORTS MEDICINE [address], [place] Phone: [phone]   Fax:  [phone]  Name: Nicole Owens MRN: Darrol Poke Date of Birth: 1980-11-15

## 2019-04-10 ENCOUNTER — Ambulatory Visit: Payer: BC Managed Care – PPO

## 2019-04-10 ENCOUNTER — Other Ambulatory Visit: Payer: Self-pay

## 2019-04-10 DIAGNOSIS — M6281 Muscle weakness (generalized): Secondary | ICD-10-CM

## 2019-04-10 DIAGNOSIS — M25551 Pain in right hip: Secondary | ICD-10-CM | POA: Diagnosis not present

## 2019-04-10 NOTE — Therapy (Signed)
[place] [facility]IC[facility] [address], [place], [address] Phone: [phone]   Fax:  [phone]  Physical Therapy Treatment  Patient Details  Name: Nicole Owens MRN: [id number] Date of Birth: [birth year] Referring Provider (PT): Lynne Leader, MD   Encounter Date: 04/10/2019  PT End of Session - 04/10/19 0804    Visit Number  6    Number of Visits  13    Date for PT Re-Evaluation  05/01/19    PT Start Time  0804    PT Stop Time  0850    PT Time Calculation (min)  46 min    Activity Tolerance  Patient tolerated treatment well    Behavior During Therapy  [facility] for tasks assessed/performed       Past Medical History:  Diagnosis Date  . Asthma   . Asthma, intermittent    mild  . Carpal tunnel syndrome on both sides    with pregnancy  . Female infertility 04/30/2010   history restored after error with record merge  . Heartburn in pregnancy    nausea  . 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 Postpartum care following cesarean delivery 02/17/2012  . Rh negative, maternal 02/17/2012  . S/P cesarean section - triplets 11/29 02/17/2012    Past Surgical History:  Procedure Laterality Date  . CESAREAN SECTION  02/16/2012   Procedure: CESAREAN SECTION;  Surgeon: Lovenia Kim, MD;  Location: [facility];  Service: Obstetrics;  Laterality: N/A;  . NO PAST SURGERIES    . WISDOM TOOTH EXTRACTION      There were no vitals filed for this visit.  Subjective Assessment - 04/10/19 0805    Subjective  R hip groin area feels pretty good, nothing really hurt.    Pertinent History  R hip pain. Pain began mid November 2020. Pt was at the gym. Pt was performing scissor kicks. Felt a little pain more than normal. Went for a backward lunge with R LE and felt sharp pain R lower abdomen. Took 10 days off and iced that area. Did not really get better. Was diagnosed with sports hernia. Still getting pain but getting a little better.  Could not jog before 6 weeks ago. Thinks she can jog slowly now. Using her R LE to stand up from a R half kneeling position bothers her. Pt goes to the gym 6 days a week before, no goes to the gym 1 time a week and performs modified upper body work out. Pivoting the wrong way sometimes would hurt. Going up the steps 2 at a time bothers her R hip. Getting out of bed involving SLR hip flexion movement would bother it. Coughing, sneezing would bother R hip. Had C section 7 years ago for triplets. The pain is along the line of the incision.    Patient Stated Goals  Anything in the lunge position, be able to run (pt runs half marathons)    Currently in Pain?  No/denies    Pain Score  0-No pain                               PT Education - 04/10/19 0814    Education Details  ther-ex    Person(s) Educated  Patient    Methods  Explanation;Demonstration;Tactile cues;Verbal cues    Comprehension  Returned demonstration;Verbalized understanding      Objective   No latex allergies  MedbridgeAccess Code: [email or web address]   Therapeutic exercise  Seated R hip flexion, no pain Seated R flexion 10x3 with 2 lbs ankle weight  Seated R hip ER 10x3 with 2 lbs ankle weight  Seated R hip IR 10x3  With 2 lbs ankle weight  LAQ 2 lbs 10x3 concentric and eccentric contraction  Walking lunges pain free range of movement. 32 ft x 4, emphasis on femoral control. Able to perform without pain  Tall 1/2 kneeling on R knee (on Air Ex Pad) PNF chops double red band    To the R 10x3  To the L 10x3  Squat to chair 10x3  SLS R LE with 2 kg ball toss 20x3  R S/L R hip adduction 5x3 (2-3/10 level of discomfort, eases with rest)   Supine SRL R hip flexion 1/2 way 10x3  Supine posterior pelvic tilts to engage abdominal muscles 10x5 seconds for 3 sets   Seated simultaneous bilateral mini hip flexion, pain free level of movement 10x3               Supine hip  abduction/addiction, R foot on slick board 10x3                Improved exercise technique, movement at target joints, use of target muscles aftermin tomod verbal, visual, tactile cues.      Response to treatment Pt tolerated session well without aggravation of symptoms.    Clinical presentation Added resistance to concentric muscle activation to promote regain of strength and proper stress to healing tissues. Still has some discomfort at times with hip flexors, adductors, and distal abdominal muscles but no more than a 2-3/10 reported by pt and eases with rest. Able to provide 2 lbs weight resistance to some exercises without any reports of symptoms. Pt tolerated session well without aggravation of symptoms and demonstrates increased tolerance and ability for increased load on affected tissues. Pt will benefit from continued skilled physical therapy services to decrease pain, improve strength and function.       PT Short Term Goals - 03/19/19 1248      PT SHORT TERM GOAL #1   Title  Patient will be independent with her HEP to decrease pain, improve strength and function.    Baseline  Pt has started her HEP (03/19/2019)    Time  3    Period  Weeks    Status  New    Target Date  04/10/19        PT Long Term Goals - 03/19/19 1251      PT LONG TERM GOAL #1   Title  Pt will have a decrease in R hip pain to 2/10 or less at worst to promote ability to run, perform exercises, stand up from a half kneeling position more comfortably.    Baseline  6/10 R hip pain at most (03/19/2019)    Time  6    Period  Weeks    Status  New    Target Date  05/01/19      PT LONG TERM GOAL #2   Title  Patient will improve R hip flexion, extension, abduction, and adduction strength by at least 1/2 MMT grade to promote ability to perform functional tasks more comfortably.    Time  6    Period  Weeks    Status  New    Target Date  05/01/19      PT LONG TERM GOAL #3   Title  Pt  will be able  to jog for at least 5 minutes without R hip pain to promote ability to participate in exercise and promote physical health    Baseline  increased R hip pain when jogging or running (03/19/2019)    Time  6    Period  Weeks    Status  New    Target Date  05/01/19            Plan - 04/10/19 0900    Clinical Impression Statement  Added resistance to concentric muscle activation to promote regain of strength and proper stress to healing tissues. Still has some discomfort at times with hip flexors, adductors, and distal abdominal muscles but no more than a 2-3/10 reported by pt and eases with rest. Able to provide 2 lbs weight resistance to some exercises without any reports of symptoms. Pt tolerated session well without aggravation of symptoms and demonstrates increased tolerance and ability for increased load on affected tissues. Pt will benefit from continued skilled physical therapy services to decrease pain, improve strength and function.    Personal Factors and Comorbidities  Comorbidity 1    Comorbidities  hx of C-section    Examination-Activity Limitations  Squat    Rehab Potential  Good    PT Frequency  2x / week    PT Duration  6 weeks    PT Treatment/Interventions  Therapeutic activities;Therapeutic exercise;Neuromuscular re-education;Patient/family education;Manual techniques;Dry needling;Spinal Manipulations;Joint Manipulations;Aquatic Therapy;Electrical Stimulation;Iontophoresis 4mg /ml Dexamethasone;Ultrasound    PT Next Visit Plan  isometric, then concentric, then eccentric hip and abdominal strengthening, manual techniques, modalities PRN    Consulted and Agree with Plan of Care  Patient       Patient will benefit from skilled therapeutic intervention in order to improve the following deficits and impairments:  Pain, Improper body mechanics, Postural dysfunction, Decreased strength  Visit Diagnosis: Pain in right hip  Muscle weakness  (generalized)     Problem List Patient Active Problem List   Diagnosis Date Noted  . S/P cesarean section - triplets 11/29 02/17/2012  . Postpartum care following cesarean delivery (11/29) 02/17/2012  . Rh negative, maternal 02/17/2012  . 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 Routine general medical examination at a health care facility 06/23/2010  . FIBROCYSTIC BREAST DISEASE 07/17/2008  . Asthma, 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    06/19/2008 PT, DPT   04/10/2019, 1:14 PM  [place] [facility] PHYSICAL AND SPORTS MEDICINE [address], [address], [place] Phone: [phone]   Fax:  [phone]  Name: Nicole Owens MRN: Darrol Poke Date of Birth: [birth year]

## 2019-04-14 ENCOUNTER — Ambulatory Visit: Payer: BC Managed Care – PPO

## 2019-04-16 ENCOUNTER — Ambulatory Visit: Payer: BC Managed Care – PPO

## 2019-04-17 ENCOUNTER — Other Ambulatory Visit: Payer: Self-pay

## 2019-04-17 ENCOUNTER — Ambulatory Visit: Payer: BC Managed Care – PPO

## 2019-04-17 DIAGNOSIS — M6281 Muscle weakness (generalized): Secondary | ICD-10-CM

## 2019-04-17 DIAGNOSIS — M25551 Pain in right hip: Secondary | ICD-10-CM

## 2019-04-17 NOTE — Therapy (Signed)
[place] [facility] [address], [place], [address] Phone: [phone]   Fax:  [phone]  Physical Therapy Treatment  Patient Details  Name: Nicole Owens MRN: [id number] Date of Birth: Jan 22, 1981 Referring Provider (PT): Lynne Leader, MD   Encounter Date: 04/17/2019  PT End of Session - 04/17/19 0848    Visit Number  7    Number of Visits  13    Date for PT Re-Evaluation  05/01/19    PT Start Time  0848    PT Stop Time  0928    PT Time Calculation (min)  40 min    Activity Tolerance  Patient tolerated treatment well    Behavior During Therapy  [facility] for tasks assessed/performed       Past Medical History:  Diagnosis Date  . Asthma   . Asthma, intermittent    mild  . Carpal tunnel syndrome on both sides    with pregnancy  . Female infertility 04/30/2010   history restored after error with record merge  . Heartburn in pregnancy    nausea  . 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 Postpartum care following cesarean delivery 02/17/2012  . Rh negative, maternal 02/17/2012  . S/P cesarean section - triplets 11/29 02/17/2012    Past Surgical History:  Procedure Laterality Date  . CESAREAN SECTION  02/16/2012   Procedure: CESAREAN SECTION;  Surgeon: Lovenia Kim, MD;  Location: [facility];  Service: Obstetrics;  Laterality: N/A;  . NO PAST SURGERIES    . WISDOM TOOTH EXTRACTION      There were no vitals filed for this visit.  Subjective Assessment - 04/17/19 0849    Subjective  R groin is a little sore. Did a little mini lunges and mini squats at the gym. Feel like muscle soreness workout. No sharp pain. No pain currently.    Pertinent History  R hip pain. Pain began mid November 2020. Pt was at the gym. Pt was performing scissor kicks. Felt a little pain more than normal. Went for a backward lunge with R LE and felt sharp pain R lower abdomen. Took 10 days off and iced that area. Did not really get  better. Was diagnosed with sports hernia. Still getting pain but getting a little better. Could not jog before 6 weeks ago. Thinks she can jog slowly now. Using her R LE to stand up from a R half kneeling position bothers her. Pt goes to the gym 6 days a week before, no goes to the gym 1 time a week and performs modified upper body work out. Pivoting the wrong way sometimes would hurt. Going up the steps 2 at a time bothers her R hip. Getting out of bed involving SLR hip flexion movement would bother it. Coughing, sneezing would bother R hip. Had C section 7 years ago for triplets. The pain is along the line of the incision.    Patient Stated Goals  Anything in the lunge position, be able to run (pt runs half marathons)    Currently in Pain?  No/denies    Pain Score  0-No pain                               PT Education - 04/17/19 0852    Education Details  ther-ex    Person(s) Educated  Patient    Methods  Explanation;Demonstration;Tactile cues;Verbal cues  Comprehension  Returned demonstration;Verbalized understanding       Objective   No latex allergies    MedbridgeAccess Code: [email or web address]   Therapeutic exercise  Seated R hip flexion, no pain Seated R flexion 10x3 with 2 lbs ankle weight  LAQ 2 lbs 10x3 concentric and eccentric contraction  Seated R hip ER 10x3 with 2 lbs ankle weight  Seated R hip IR 10x3  With 2 lbs ankle weight  Supine SRL R hip flexion 1/2 way 10x2  Supine hip abduction/addiction, R foot on slick board 10x3  Supine posterior pelvic tilts to engage abdominal muscles 10x5 seconds for 3 sets   Hooklying hip adductor ball and glute max squeeze with posterior pelvic tilt 10x10 seconds for 3 sets   Hooklying lying alternating leg extension 10x2 each LE, emphasis on lumbopelvic and femoral control    Tall 1/2 kneeling on R knee (on Air Ex Pad) PNF chops double red band               To the R 10x3              To the L 10x3   Walking lunges pain free range of movement. 30 ft x 4, emphasis on femoral control. Able to perform without R hip pain        Improved exercise technique, movement at target joints, use of target muscles aftermin tomod verbal, visual, tactile cues.      Response to treatment Pt tolerated session well without aggravation of symptoms.    Clinical presentation Continued working on providing concenctric and eccentric load to hip flexors, adductors and lower abdominals to promote proper stress to healing tissues, no more than 3/10 level of pain or discomfort. Pt tolerated session well without aggravation of symptoms. Pt will benefit from continued skilled physical therapy services to decrease pain, improve strength and function.     PT Short Term Goals - 03/19/19 1248      PT SHORT TERM GOAL #1   Title  Patient will be independent with her HEP to decrease pain, improve strength and function.    Baseline  Pt has started her HEP (03/19/2019)    Time  3    Period  Weeks    Status  New    Target Date  04/10/19        PT Long Term Goals - 03/19/19 1251      PT LONG TERM GOAL #1   Title  Pt will have a decrease in R hip pain to 2/10 or less at worst to promote ability to run, perform exercises, stand up from a half kneeling position more comfortably.    Baseline  6/10 R hip pain at most (03/19/2019)    Time  6    Period  Weeks    Status  New    Target Date  05/01/19      PT LONG TERM GOAL #2   Title  Patient will improve R hip flexion, extension, abduction, and adduction strength by at least 1/2 MMT grade to promote ability to perform functional tasks more comfortably.    Time  6    Period  Weeks    Status  New    Target Date  05/01/19      PT LONG TERM GOAL #3   Title  Pt will be able to jog for at least 5 minutes without R hip pain to promote ability to participate in exercise and promote physical health  Baseline  increased R hip pain when jogging or running (03/19/2019)    Time  6    Period  Weeks    Status  New    Target Date  05/01/19            Plan - 04/17/19 0852    Clinical Impression Statement  Continued working on providing concenctric and eccentric load to hip flexors, adductors and lower abdominals to promote proper stress to healing tissues, no more than 3/10 level of pain or discomfort. Pt tolerated session well without aggravation of symptoms. Pt will benefit from continued skilled physical therapy services to decrease pain, improve strength and function.    Personal Factors and Comorbidities  Comorbidity 1    Comorbidities  hx of C-section    Examination-Activity Limitations  Squat    Rehab Potential  Good    PT Frequency  2x / week    PT Duration  6 weeks    PT Treatment/Interventions  Therapeutic activities;Therapeutic exercise;Neuromuscular re-education;Patient/family education;Manual techniques;Dry needling;Spinal Manipulations;Joint Manipulations;Aquatic Therapy;Electrical Stimulation;Iontophoresis 4mg /ml Dexamethasone;Ultrasound    PT Next Visit Plan  isometric, then concentric, then eccentric hip and abdominal strengthening, manual techniques, modalities PRN    Consulted and Agree with Plan of Care  Patient       Patient will benefit from skilled therapeutic intervention in order to improve the following deficits and impairments:  Pain, Improper body mechanics, Postural dysfunction, Decreased strength  Visit Diagnosis: Pain in right hip  Muscle weakness (generalized)     Problem List Patient Active Problem List   Diagnosis Date Noted  . S/P cesarean section - triplets 11/29 02/17/2012  . Postpartum care following cesarean delivery (11/29) 02/17/2012  . Rh negative, maternal 02/17/2012  . 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 Routine general medical examination at a health care facility 06/23/2010  . FIBROCYSTIC BREAST DISEASE 07/17/2008  .  Asthma, 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     06/19/2008 PT, DPT   04/17/2019, 7:56 PM  [facility] [address], [place] Phone: [phone]   Fax:  [phone]  Name: Nicole Owens MRN: Darrol Poke Date of Birth: 1981-02-14

## 2019-04-21 ENCOUNTER — Ambulatory Visit: Payer: BC Managed Care – PPO | Admitting: Family Medicine

## 2019-04-22 ENCOUNTER — Other Ambulatory Visit: Payer: Self-pay

## 2019-04-22 ENCOUNTER — Ambulatory Visit: Payer: BC Managed Care – PPO | Attending: Family Medicine

## 2019-04-22 DIAGNOSIS — M25551 Pain in right hip: Secondary | ICD-10-CM | POA: Diagnosis not present

## 2019-04-22 DIAGNOSIS — M6281 Muscle weakness (generalized): Secondary | ICD-10-CM

## 2019-04-22 NOTE — Therapy (Signed)
[facility] [address] [place], [address] Phone: [phone]   Fax:  [phone]  Physical Therapy Treatment  Patient Details  Name: Nicole Owens MRN: [id number] Date of Birth: Jul 24, 1980 Referring Provider (PT): [name], MD   Encounter Date: 04/22/2019  PT End of Session - 04/22/19 0949    Visit Number  8    Number of Visits  13    Date for PT Re-Evaluation  05/01/19    PT Start Time  0949    PT Stop Time  1033    PT Time Calculation (min)  44 min    Activity Tolerance  Patient tolerated treatment well    Behavior During Therapy  [facility] for tasks assessed/performed       Past Medical History:  Diagnosis Date  . Asthma   . Asthma, intermittent    mild  . Carpal tunnel syndrome on both sides    with pregnancy  . Female infertility 04/30/2010   history restored after error with record merge  . Heartburn in pregnancy    nausea  . 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 Postpartum care following cesarean delivery 02/17/2012  . Rh negative, maternal 02/17/2012  . S/P cesarean section - triplets 11/29 02/17/2012    Past Surgical History:  Procedure Laterality Date  . CESAREAN SECTION  02/16/2012   Procedure: CESAREAN SECTION;  Surgeon: Lenoard Aden, MD;  Location: [facility];  Service: Obstetrics;  Laterality: N/A;  . NO PAST SURGERIES    . WISDOM TOOTH EXTRACTION      There were no vitals filed for this visit.  Subjective Assessment - 04/22/19 0950    Subjective  R groin feels good. Was probably sore after last session. Usually pretty sore after therapy which lasts for a day.    Pertinent History  R hip pain. Pain began mid November 2020. Pt was at the gym. Pt was performing scissor kicks. Felt a little pain more than normal. Went for a backward lunge with R LE and felt sharp pain R lower abdomen. Took 10 days off and iced that area. Did not really get better. Was diagnosed with sports  hernia. Still getting pain but getting a little better. Could not jog before 6 weeks ago. Thinks she can jog slowly now. Using her R LE to stand up from a R half kneeling position bothers her. Pt goes to the gym 6 days a week before, no goes to the gym 1 time a week and performs modified upper body work out. Pivoting the wrong way sometimes would hurt. Going up the steps 2 at a time bothers her R hip. Getting out of bed involving SLR hip flexion movement would bother it. Coughing, sneezing would bother R hip. Had C section 7 years ago for triplets. The pain is along the line of the incision.    Patient Stated Goals  Anything in the lunge position, be able to run (pt runs half marathons)    Currently in Pain?  No/denies    Pain Score  0-No pain         [facility] PT Assessment - 04/22/19 0953      Strength   Right Hip Flexion  4+/5    Right Hip Extension  4/5   slight R hip discomfort, not pain  PT Education - 04/22/19 1211    Education Details  ther-ex    Person(s) Educated  Patient    Methods  Explanation;Demonstration;Tactile cues;Verbal cues    Comprehension  Returned demonstration;Verbalized understanding      Objective   No latex allergies    MedbridgeAccess Code: [email or web address]   Therapeutic exercise  Seated manually resisted R hip flexion 4+/5, no pain  prone manually resisted R hip extension: 4/5 with discomfort   Prone R hip etxension 10x  5x5 seconds 3/10   S/L R hip adduction 2x, 3/10 pain, eases with rest  Supine R hip extension isometrics 10x10 seconds for 2 sets  Supine with R LE straight, R hip adduction isometrics, 10x10 seconds for 2 sets. R hip flexor area discomfort  Then with hip extension isometrics 8x5 seconds. 4/10 soreness afterwards  Seated R hip extension isometrics 10x5 seconds for 2 sets. Slight R hip discomfort, eases quickly afterwards  S/L R hip abduction 10x3  SLS on R LE  With 2 kg ball  toss 20x2   Tall 1/2 kneeling on R knee (on Air Ex Pad)  With 2 kg ball toss 20x2  R single leg deadlift with L foot on furniture slider  10x2  Seated R hip flexion, no pain Seated R flexion 15x2with 2 lbs ankle weight          Improved exercise technique, movement at target joints, use of target muscles aftermin tomod verbal, visual, tactile cues.      Response to treatment Fair tolerance to today's session.   Clinical presentation No pain with resisted hip flexion today. Pt also demonstrates appropriate level of soreness after PT which only lasts for a day based on subjective reports. Still demonstrates discomfort with R hip adductor muscles when activated and with gentle isometric manual resistance. Continued working on providing gentle stress to affected muscles to promote proper healing. Pt will benefit from continued skilled physical therapy services to decrease pain, improve strength and function.   PT Short Term Goals - 03/19/19 1248      PT SHORT TERM GOAL #1   Title  Patient will be independent with her HEP to decrease pain, improve strength and function.    Baseline  Pt has started her HEP (03/19/2019)    Time  3    Period  Weeks    Status  New    Target Date  04/10/19        PT Long Term Goals - 03/19/19 1251      PT LONG TERM GOAL #1   Title  Pt will have a decrease in R hip pain to 2/10 or less at worst to promote ability to run, perform exercises, stand up from a half kneeling position more comfortably.    Baseline  6/10 R hip pain at most (03/19/2019)    Time  6    Period  Weeks    Status  New    Target Date  05/01/19      PT LONG TERM GOAL #2   Title  Patient will improve R hip flexion, extension, abduction, and adduction strength by at least 1/2 MMT grade to promote ability to perform functional tasks more comfortably.    Time  6    Period  Weeks    Status  New    Target Date  05/01/19       PT LONG TERM GOAL #3   Title  Pt will be able to jog for at least 5 minutes without  R hip pain to promote ability to participate in exercise and promote physical health    Baseline  increased R hip pain when jogging or running (03/19/2019)    Time  6    Period  Weeks    Status  New    Target Date  05/01/19            Plan - 04/22/19 1213    Clinical Impression Statement  No pain with resisted hip flexion today. Pt also demonstrates appropriate level of soreness after PT which only lasts for a day based on subjective reports. Still demonstrates discomfort with R hip adductor muscles when activated and with gentle isometric manual resistance. Continued working on providing gentle stress to affected muscles to promote proper healing. Pt will benefit from continued skilled physical therapy services to decrease pain, improve strength and function.    Personal Factors and Comorbidities  Comorbidity 1    Comorbidities  hx of C-section    Examination-Activity Limitations  Squat    Rehab Potential  Good    PT Frequency  2x / week    PT Duration  6 weeks    PT Treatment/Interventions  Therapeutic activities;Therapeutic exercise;Neuromuscular re-education;Patient/family education;Manual techniques;Dry needling;Spinal Manipulations;Joint Manipulations;Aquatic Therapy;Electrical Stimulation;Iontophoresis 4mg /ml Dexamethasone;Ultrasound    PT Next Visit Plan  isometric, then concentric, then eccentric hip and abdominal strengthening, manual techniques, modalities PRN    Consulted and Agree with Plan of Care  Patient       Patient will benefit from skilled therapeutic intervention in order to improve the following deficits and impairments:  Pain, Improper body mechanics, Postural dysfunction, Decreased strength  Visit Diagnosis: Pain in right hip  Muscle weakness (generalized)     Problem List Patient Active Problem List   Diagnosis Date Noted  . S/P cesarean section - triplets 11/29  02/17/2012  . Postpartum care following cesarean delivery (11/29) 02/17/2012  . Rh negative, maternal 02/17/2012  . 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 Routine general medical examination at a health care facility 06/23/2010  . FIBROCYSTIC BREAST DISEASE 07/17/2008  . Asthma, 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    06/19/2008 PT, DPT   04/22/2019, 12:17 PM  [place] [facility] [address], [place] Phone: [phone]   Fax:  [phone]  Name: Nicole Owens MRN: Darrol Poke Date of Birth: 24-Feb-1981

## 2019-04-22 NOTE — Patient Instructions (Signed)
Recommended cross friction massage along scar tissue area of C-section comfortably. Pt verbalized understanding.

## 2019-04-24 ENCOUNTER — Ambulatory Visit: Payer: BC Managed Care – PPO

## 2019-04-24 ENCOUNTER — Other Ambulatory Visit: Payer: Self-pay

## 2019-04-24 DIAGNOSIS — M6281 Muscle weakness (generalized): Secondary | ICD-10-CM

## 2019-04-24 DIAGNOSIS — M25551 Pain in right hip: Secondary | ICD-10-CM

## 2019-04-24 NOTE — Therapy (Signed)
[facility] [address] [place], [address] Phone: [phone]   Fax:  [phone]  Physical Therapy Treatment  Patient Details  Name: Nicole Owens MRN: [id number] Date of Birth: September 01, 1980 Referring Provider (PT): [name], MD   Encounter Date: 04/24/2019  PT End of Session - 04/24/19 1402    Visit Number  9    Number of Visits  13    Date for PT Re-Evaluation  05/01/19    PT Start Time  1403    PT Stop Time  1445    PT Time Calculation (min)  42 min    Activity Tolerance  Patient tolerated treatment well    Behavior During Therapy  [facility] for tasks assessed/performed       Past Medical History:  Diagnosis Date  . Asthma   . Asthma, intermittent    mild  . Carpal tunnel syndrome on both sides    with pregnancy  . Female infertility 04/30/2010   history restored after error with record merge  . Heartburn in pregnancy    nausea  . 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 Postpartum care following cesarean delivery 02/17/2012  . Rh negative, maternal 02/17/2012  . S/P cesarean section - triplets 11/29 02/17/2012    Past Surgical History:  Procedure Laterality Date  . CESAREAN SECTION  02/16/2012   Procedure: CESAREAN SECTION;  Surgeon: Lenoard Aden, MD;  Location: [facility];  Service: Obstetrics;  Laterality: N/A;  . NO PAST SURGERIES    . WISDOM TOOTH EXTRACTION      There were no vitals filed for this visit.  Subjective Assessment - 04/24/19 1404    Subjective  R groin is sore but she did a lot of steps at work. Was fine before work. No pain. R hip soreness did not linger as much after last session. R hip did not hurt as bad getting into bed.  R hip pain is not as frequent and random.    Pertinent History  R hip pain. Pain began mid November 2020. Pt was at the gym. Pt was performing scissor kicks. Felt a little pain more than normal. Went for a backward lunge with R LE and felt sharp  pain R lower abdomen. Took 10 days off and iced that area. Did not really get better. Was diagnosed with sports hernia. Still getting pain but getting a little better. Could not jog before 6 weeks ago. Thinks she can jog slowly now. Using her R LE to stand up from a R half kneeling position bothers her. Pt goes to the gym 6 days a week before, no goes to the gym 1 time a week and performs modified upper body work out. Pivoting the wrong way sometimes would hurt. Going up the steps 2 at a time bothers her R hip. Getting out of bed involving SLR hip flexion movement would bother it. Coughing, sneezing would bother R hip. Had C section 7 years ago for triplets. The pain is along the line of the incision.    Patient Stated Goals  Anything in the lunge position, be able to run (pt runs half marathons)    Currently in Pain?  No/denies                               PT Education - 04/24/19 1406    Education Details  ther-ex    Person(s)  Educated  Patient    Methods  Explanation;Demonstration;Tactile cues;Verbal cues    Comprehension  Returned demonstration;Verbalized understanding      Objective   No latex allergies    MedbridgeAccess Code: [email or web address]   Pt wants to work up to being able to do planks, pushups, sit ups, spider and mountain climbers.   Therapeutic exercise  Standing hip marches 10x3  Static mini jog in place 30 seconds x3    Mini side shuffles 32 ft to the R and 32 ft to the L 3x  Small steps so as to not stress R hip adductors   Mini light jog 500 ft to the R, then 500 ft to the L, small steps so as to not irritate L hip adductor muscles  SLS on L LE with R foot on furniture slider and UE as needed  R hip adduction 10x3  R hip extension/flexion 10x3  R hip circles (clockwise and counterclockwise) 10x3 each way   Tall 1/2 kneeling on R knee (on Air Ex Pad)             With 2 kg ball toss 20x2  R single leg deadlift with L foot on  furniture slider             10x2  Seated hip adduction folded pillow squeeze, pain free level of effort 10x5 seconds    Seated R hip ER 2 lbs  10x3  Wall push-ups 10x  Feels R hip muscle use but not painful  Reviewed plan of care: continue 1x week for 4 weeks after next week        Improved exercise technique, movement at target joints, use of target muscles aftermin tomod verbal, visual, tactile cues.      Response to treatment Pt tolerated session well without aggravation of symptoms.   Clinical presentation Improving ability to tolerate more activities with R hip. Introduced light jogging and light side shuffling today without aggravation of symptoms. Continued working on improving R LE strength while applying proper stress to hip muscles to promote healing to injured muscles. Pt tolerated session well without aggravation of symptoms. Pt will benefit from continued skilled physical therapy services to decrease pain, improve strength and function.      PT Short Term Goals - 03/19/19 1248      PT SHORT TERM GOAL #1   Title  Patient will be independent with her HEP to decrease pain, improve strength and function.    Baseline  Pt has started her HEP (03/19/2019)    Time  3    Period  Weeks    Status  New    Target Date  04/10/19        PT Long Term Goals - 03/19/19 1251      PT LONG TERM GOAL #1   Title  Pt will have a decrease in R hip pain to 2/10 or less at worst to promote ability to run, perform exercises, stand up from a half kneeling position more comfortably.    Baseline  6/10 R hip pain at most (03/19/2019)    Time  6    Period  Weeks    Status  New    Target Date  05/01/19      PT LONG TERM GOAL #2   Title  Patient will improve R hip flexion, extension, abduction, and adduction strength by at least 1/2 MMT grade to promote ability to perform functional tasks more comfortably.    Time  6  Period  Weeks     Status  New    Target Date  05/01/19      PT LONG TERM GOAL #3   Title  Pt will be able to jog for at least 5 minutes without R hip pain to promote ability to participate in exercise and promote physical health    Baseline  increased R hip pain when jogging or running (03/19/2019)    Time  6    Period  Weeks    Status  New    Target Date  05/01/19            Plan - 04/24/19 1402    Clinical Impression Statement  Improving ability to tolerate more activities with R hip. Introduced light jogging and light side shuffling today without aggravation of symptoms. Continued working on improving R LE strength while applying proper stress to hip muscles to promote healing to injured muscles. Pt tolerated session well without aggravation of symptoms. Pt will benefit from continued skilled physical therapy services to decrease pain, improve strength and function.    Personal Factors and Comorbidities  Comorbidity 1    Comorbidities  hx of C-section    Examination-Activity Limitations  Squat    Rehab Potential  Good    PT Frequency  2x / week    PT Duration  6 weeks    PT Treatment/Interventions  Therapeutic activities;Therapeutic exercise;Neuromuscular re-education;Patient/family education;Manual techniques;Dry needling;Spinal Manipulations;Joint Manipulations;Aquatic Therapy;Electrical Stimulation;Iontophoresis 4mg /ml Dexamethasone;Ultrasound    PT Next Visit Plan  isometric, then concentric, then eccentric hip and abdominal strengthening, manual techniques, modalities PRN    Consulted and Agree with Plan of Care  Patient       Patient will benefit from skilled therapeutic intervention in order to improve the following deficits and impairments:  Pain, Improper body mechanics, Postural dysfunction, Decreased strength  Visit Diagnosis: Pain in right hip  Muscle weakness (generalized)     Problem List Patient Active Problem List   Diagnosis Date Noted  . S/P cesarean section - triplets  11/29 02/17/2012  . Postpartum care following cesarean delivery (11/29) 02/17/2012  . Rh negative, maternal 02/17/2012  . 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 Routine general medical examination at a health care facility 06/23/2010  . FIBROCYSTIC BREAST DISEASE 07/17/2008  . Asthma, 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    Joneen Boers PT, DPT   04/24/2019, 7:33 PM  [place] [facility] [address] [place], [address] Phone: [phone]   Fax:  [phone]  Name: Nicole Owens MRN: [id number] Date of Birth: 09/10/1980

## 2019-04-29 ENCOUNTER — Other Ambulatory Visit: Payer: Self-pay

## 2019-04-29 ENCOUNTER — Ambulatory Visit: Payer: BC Managed Care – PPO

## 2019-04-29 DIAGNOSIS — M25551 Pain in right hip: Secondary | ICD-10-CM | POA: Diagnosis not present

## 2019-04-29 DIAGNOSIS — M6281 Muscle weakness (generalized): Secondary | ICD-10-CM | POA: Diagnosis not present

## 2019-04-29 NOTE — Therapy (Signed)
[facility] [address] [place], [address] Phone: [phone]   Fax:  [phone]  Physical Therapy Treatment  Patient Details  Name: Nicole Owens MRN: [id number] Date of Birth: 1980-06-03 Referring Provider (PT): [name], MD   Encounter Date: 04/29/2019  PT End of Session - 04/29/19 0945    Visit Number  10    Number of Visits  13    Date for PT Re-Evaluation  05/01/19    PT Start Time  0945    PT Stop Time  1029    PT Time Calculation (min)  44 min    Activity Tolerance  Patient tolerated treatment well    Behavior During Therapy  [facility] for tasks assessed/performed       Past Medical History:  Diagnosis Date  . Asthma   . Asthma, intermittent    mild  . Carpal tunnel syndrome on both sides    with pregnancy  . Female infertility 04/30/2010   history restored after error with record merge  . Heartburn in pregnancy    nausea  . 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 Postpartum care following cesarean delivery 02/17/2012  . Rh negative, maternal 02/17/2012  . S/P cesarean section - triplets 11/29 02/17/2012    Past Surgical History:  Procedure Laterality Date  . CESAREAN SECTION  02/16/2012   Procedure: CESAREAN SECTION;  Surgeon: Lenoard Aden, MD;  Location: [facility];  Service: Obstetrics;  Laterality: N/A;  . NO PAST SURGERIES    . WISDOM TOOTH EXTRACTION      There were no vitals filed for this visit.  Subjective Assessment - 04/29/19 0946    Subjective  R groin feels ok today. Was sore after last session but soreness went away after a day. R groin was ok during the weekend. Did kneeling push-ups at the gym, about 8x and stopped intermittently due to soreness. Also did some hip abductions and might have gone out too far.  5-6/10 (after last session) R hip pain at most for the past 7 days but 2-3/10 at most minus last session.  Pt also slipped on ice on Monday which increased her R  hip sorenss.    Pertinent History  R hip pain. Pain began mid November 2020. Pt was at the gym. Pt was performing scissor kicks. Felt a little pain more than normal. Went for a backward lunge with R LE and felt sharp pain R lower abdomen. Took 10 days off and iced that area. Did not really get better. Was diagnosed with sports hernia. Still getting pain but getting a little better. Could not jog before 6 weeks ago. Thinks she can jog slowly now. Using her R LE to stand up from a R half kneeling position bothers her. Pt goes to the gym 6 days a week before, no goes to the gym 1 time a week and performs modified upper body work out. Pivoting the wrong way sometimes would hurt. Going up the steps 2 at a time bothers her R hip. Getting out of bed involving SLR hip flexion movement would bother it. Coughing, sneezing would bother R hip. Had C section 7 years ago for triplets. The pain is along the line of the incision.    Patient Stated Goals  Anything in the lunge position, be able to run (pt runs half marathons)    Currently in Pain?  No/denies    Pain Score  0-No pain  PT Education - 04/29/19 0948    Education Details  ther-ex    Person(s) Educated  Patient    Methods  Explanation;Demonstration;Tactile cues;Verbal cues    Comprehension  Returned demonstration;Verbalized understanding       Objective   No latex allergies    MedbridgeAccess Code: [email or web address]   Pt wants to work up to being able to do planks, pushups, sit ups, spider and mountain climbers.   Therapeutic exercise  Standing hip marches 10x3  Mini light jog. Discomfort eases with rest  Static mini jog in place 1 minute x 3  No pain    Mini light jog 500 ft to the R, then 500 ft to the L, small steps so as to not irritate L hip adductor muscles  No pain  Standing B gastroc stretch on rocker board with B UE assist 30 seconds x 3  Mini side shuffles 32 ft to  the R and 32 ft to the L 3x             Small steps so as to not stress R hip adductors   Seated hip adductor ball squeeze, pain free level of effort 10x10 seconds for 3 sets  Standing R hip adduction with R foot furniture slider 10x3 pain free level of effort.   Standing R hip flexion/extension with R foot on furniture slider 10x3, pain free level of effort  R single leg deadlift with L foot on furniture slider 10x2    Improved exercise technique, movement at target joints, use of target muscles aftermin tomod verbal, visual, tactile cues.      Response to treatment Pt tolerated session well without aggravation of symptoms.   Clinical presentation Pt able to continue mini jogs with small steps without complain of pain. Continued working on R hip flexor, adductor isometric, and concentric strengthening in a pain free level of effort to promote proper stress to healing tissues. Pt tolerated session well without aggravation of symptoms. Able to add gentle resistance with R hip adduction in standing from friction on the furniture slider on the ground without pain. Pt will benefit from continued skilled physical therapy services to decrease pain, improve strength and function.    PT Short Term Goals - 03/19/19 1248      PT SHORT TERM GOAL #1   Title  Patient will be independent with her HEP to decrease pain, improve strength and function.    Baseline  Pt has started her HEP (03/19/2019)    Time  3    Period  Weeks    Status  New    Target Date  04/10/19        PT Long Term Goals - 03/19/19 1251      PT LONG TERM GOAL #1   Title  Pt will have a decrease in R hip pain to 2/10 or less at worst to promote ability to run, perform exercises, stand up from a half kneeling position more comfortably.    Baseline  6/10 R hip pain at most (03/19/2019)    Time  6    Period  Weeks    Status  New    Target Date  05/01/19      PT LONG TERM GOAL #2   Title   Patient will improve R hip flexion, extension, abduction, and adduction strength by at least 1/2 MMT grade to promote ability to perform functional tasks more comfortably.    Time  6    Period  Weeks  Status  New    Target Date  05/01/19      PT LONG TERM GOAL #3   Title  Pt will be able to jog for at least 5 minutes without R hip pain to promote ability to participate in exercise and promote physical health    Baseline  increased R hip pain when jogging or running (03/19/2019)    Time  6    Period  Weeks    Status  New    Target Date  05/01/19            Plan - 04/29/19 0948    Clinical Impression Statement  Pt able to continue mini jogs with small steps without complain of pain. Continued working on R hip flexor, adductor isometric, and concentric strengthening in a pain free level of effort to promote proper stress to healing tissues. Pt tolerated session well without aggravation of symptoms. Able to add gentle resistance with R hip adduction in standing from friction on the furniture slider on the ground without pain. Pt will benefit from continued skilled physical therapy services to decrease pain, improve strength and function.    Personal Factors and Comorbidities  Comorbidity 1    Comorbidities  hx of C-section    Examination-Activity Limitations  Squat    Rehab Potential  Good    PT Frequency  2x / week    PT Duration  6 weeks    PT Treatment/Interventions  Therapeutic activities;Therapeutic exercise;Neuromuscular re-education;Patient/family education;Manual techniques;Dry needling;Spinal Manipulations;Joint Manipulations;Aquatic Therapy;Electrical Stimulation;Iontophoresis 4mg /ml Dexamethasone;Ultrasound    PT Next Visit Plan  isometric, then concentric, then eccentric hip and abdominal strengthening, manual techniques, modalities PRN    Consulted and Agree with Plan of Care  Patient       Patient will benefit from skilled therapeutic intervention in order to improve  the following deficits and impairments:  Pain, Improper body mechanics, Postural dysfunction, Decreased strength  Visit Diagnosis: Muscle weakness (generalized)  Pain in right hip     Problem List Patient Active Problem List   Diagnosis Date Noted  . S/P cesarean section - triplets 11/29 02/17/2012  . Postpartum care following cesarean delivery (11/29) 02/17/2012  . Rh negative, maternal 02/17/2012  . 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 Routine general medical examination at a health care facility 06/23/2010  . FIBROCYSTIC BREAST DISEASE 07/17/2008  . Asthma, 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     Joneen Boers PT, DPT   04/29/2019, 10:40 AM  [place] [facility] [address] [place], [address] Phone: [phone]   Fax:  [phone]  Name: Nicole Owens MRN: [id number] Date of Birth: 05/25/1980

## 2019-04-29 NOTE — Patient Instructions (Signed)
Pt can try mini jog in place at home. Pt demonstrated and verbalized understanding.

## 2019-05-01 ENCOUNTER — Other Ambulatory Visit: Payer: Self-pay

## 2019-05-01 ENCOUNTER — Ambulatory Visit: Payer: BC Managed Care – PPO

## 2019-05-01 DIAGNOSIS — M6281 Muscle weakness (generalized): Secondary | ICD-10-CM | POA: Diagnosis not present

## 2019-05-01 DIAGNOSIS — M25551 Pain in right hip: Secondary | ICD-10-CM | POA: Diagnosis not present

## 2019-05-01 NOTE — Patient Instructions (Signed)
Standing hip adduction with right foot on furniture slider   Standing and holding onto something sturdy for support as needed   Bring your right foot out to the side and bring it back to your starting position    Apply enough resistance to the ground so that it is not painful.    Repeat 10 times    Perform 3 sets daily      You can also perform this moving your right foot front and back   3 sets of 10 daily.

## 2019-05-01 NOTE — Therapy (Signed)
[facility] [address] [place], [address] Phone: [phone]   Fax:  [phone]  Physical Therapy Treatment  Patient Details  Name: Nicole Owens MRN: [id number] Date of Birth: [birth year] Referring Provider (PT): Lynne Leader, MD   Encounter Date: 05/01/2019  PT End of Session - 05/01/19 0904    Visit Number  11    Number of Visits  17    Date for PT Re-Evaluation  05/29/19    PT Start Time  0904    PT Stop Time  0955    PT Time Calculation (min)  51 min    Activity Tolerance  Patient tolerated treatment well    Behavior During Therapy  [facility] for tasks assessed/performed       Past Medical History:  Diagnosis Date  . Asthma   . Asthma, intermittent    mild  . Carpal tunnel syndrome on both sides    with pregnancy  . Female infertility 04/30/2010   history restored after error with record merge  . Heartburn in pregnancy    nausea  . 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 Postpartum care following cesarean delivery 02/17/2012  . Rh negative, maternal 02/17/2012  . S/P cesarean section - triplets 11/29 02/17/2012    Past Surgical History:  Procedure Laterality Date  . CESAREAN SECTION  02/16/2012   Procedure: CESAREAN SECTION;  Surgeon: Lovenia Kim, MD;  Location: [facility];  Service: Obstetrics;  Laterality: N/A;  . NO PAST SURGERIES    . WISDOM TOOTH EXTRACTION      There were no vitals filed for this visit.  Subjective Assessment - 05/01/19 0905    Subjective  R hip feels fine. Was not sore after last session but was tired.  2-3/10 at most for the past 7 days minus the slip on the ice.    Pertinent History  R hip pain. Pain began mid November 2020. Pt was at the gym. Pt was performing scissor kicks. Felt a little pain more than normal. Went for a backward lunge with R LE and felt sharp pain R lower abdomen. Took 10 days off and iced that area. Did not really get better. Was diagnosed  with sports hernia. Still getting pain but getting a little better. Could not jog before 6 weeks ago. Thinks she can jog slowly now. Using her R LE to stand up from a R half kneeling position bothers her. Pt goes to the gym 6 days a week before, no goes to the gym 1 time a week and performs modified upper body work out. Pivoting the wrong way sometimes would hurt. Going up the steps 2 at a time bothers her R hip. Getting out of bed involving SLR hip flexion movement would bother it. Coughing, sneezing would bother R hip. Had C section 7 years ago for triplets. The pain is along the line of the incision.    Patient Stated Goals  Anything in the lunge position, be able to run (pt runs half marathons)    Currently in Pain?  No/denies    Pain Score  0-No pain         [facility] PT Assessment - 05/01/19 0909      Strength   Right Hip Flexion  4+/5    Right Hip Extension  4/5   slight R hip discomfort, not pain   Right Hip ABduction  4+/5    Right Hip ADduction  3-/5  with groin symptoms                          PT Education - 05/01/19 0919    Education Details  ther-ex    Person(s) Educated  Patient    Methods  Explanation;Demonstration;Tactile cues;Verbal cues    Comprehension  Returned demonstration;Verbalized understanding      Objective   No latex allergies    MedbridgeAccess Code: [email or web address]   Pt wants to work up to being able to do planks, pushups, sit ups, spider and mountain climbers.  Therapeutic exercise   Seated manually resisted hip flexion, prone hip extension, S/L hip abduction and adduction R hip  R S/L R hip assisted adduction with PT 10x, discomfort free level of effort   Supine R hip adduction isometrics 10x5 seconds, then 4x5 seconds (R hip adductor discomfort)  Supine posterior pelvic tilt with B shoulder extension isometrics 10x5 seconds for 3 sets to target lower abdominals  Wall push-ups 10x  Standing low rows green band  10x10 seconds for 2 sets to target lower abdominals  Mini/light jog 500 ft to the R, then 500 ft to the L, small steps so as to not irritate L hip adductor muscles             No pain  Standing B gastroc stretch on rocker board with B UE assist 30 seconds x 3    Standing R hip adduction with R foot furniture slider 10x3 pain free level of effort.   Standing R hip flexion/extension with R foot on furniture slider 10x3, pain free level of effort    R single leg deadlift with L foot on furniture slider with yellow band resist to R hip adduction, emphasis on femoral control and R hip adduction muscle contraction comfortably.  10x3  Mini side shuffles 32 ft to the R and 32 ft to the L 4x Small steps so as to not stress R hip adductors     Add ladder drills (small steps) next visit if appropriate    Improved exercise technique, movement at target joints, use of target muscles aftermin tomod verbal, visual, tactile cues.      Response to treatment Pt tolerated session well without aggravation of symptoms.  Clinical presentation Pt demonstrates overall decreased R groin pain since initial evaluation. Improved ability to tolerate load overall to affected muscles but hip adductors seem to have the most difficulty doing so. Pt was recommended to continue her isometric hip adductor exercises at 40% effort home exercise to help address (pt reports not performing them anymore). Pt also currently able to perform light jogs with small steps and mini lunges at the gym comfortably for her R hip. Pt making progress with PT towards goals. Pt will benefit from continued skilled physical therapy services to decrease pain, improve load tolerance to affected muscles, improve strength and function.      PT Short Term Goals - 05/01/19 1149      PT SHORT TERM GOAL #1   Title  Patient will be independent with her HEP to decrease pain, improve strength and  function.    Baseline  Pt has started her HEP (03/19/2019); Pt demonstrates independence with her HEP (05/01/2019)    Time  3    Period  Weeks    Status  Achieved    Target Date  04/10/19        PT Long Term Goals - 05/01/19 4315  PT LONG TERM GOAL #1   Title  Pt will have a decrease in R hip pain to 2/10 or less at worst to promote ability to run, perform exercises, stand up from a half kneeling position more comfortably.    Baseline  6/10 R hip pain at most (03/19/2019); 2-3/10 at most for the past 7 days (05/01/2019)    Time  4    Period  Weeks    Status  Partially Met    Target Date  05/29/19      PT LONG TERM GOAL #2   Title  Patient will improve R hip flexion, extension, abduction, and adduction strength by at least 1/2 MMT grade to promote ability to perform functional tasks more comfortably.    Time  4    Period  Weeks    Status  Partially Met    Target Date  05/29/19      PT LONG TERM GOAL #3   Title  Pt will be able to jog for at least 5 minutes without R hip pain to promote ability to participate in exercise and promote physical health    Baseline  increased R hip pain when jogging or running (03/19/2019); able to perform mini jog with short stride length (05/01/2019)    Time  4    Period  Weeks    Status  Partially Met    Target Date  05/29/19            Plan - 05/01/19 0919    Clinical Impression Statement  Pt demonstrates overall decreased R groin pain since initial evaluation. Improved ability to tolerate load overall to affected muscles but hip adductors seem to have the most difficulty doing so. Pt was recommended to continue her isometric hip adductor exercises at 40% effort home exercise to help address (pt reports not performing them anymore). Pt also currently able to perform light jogs with small steps and mini lunges at the gym comfortably for her R hip. Pt making progress with PT towards goals. Pt will benefit from continued skilled physical therapy  services to decrease pain, improve load tolerance to affected muscles, improve strength and function.    Personal Factors and Comorbidities  Comorbidity 1    Comorbidities  hx of C-section    Examination-Activity Limitations  Squat    Rehab Potential  Good    PT Frequency  2x / week    PT Duration  6 weeks    PT Treatment/Interventions  Therapeutic activities;Therapeutic exercise;Neuromuscular re-education;Patient/family education;Manual techniques;Dry needling;Spinal Manipulations;Joint Manipulations;Aquatic Therapy;Electrical Stimulation;Iontophoresis 80m/ml Dexamethasone;Ultrasound    PT Next Visit Plan  isometric, then concentric, then eccentric hip and abdominal strengthening, manual techniques, modalities PRN    Consulted and Agree with Plan of Care  Patient       Patient will benefit from skilled therapeutic intervention in order to improve the following deficits and impairments:  Pain, Improper body mechanics, Postural dysfunction, Decreased strength  Visit Diagnosis: Muscle weakness (generalized) - Plan: PT plan of care cert/re-cert  Pain in right hip - Plan: PT plan of care cert/re-cert      Problem List Patient Active Problem List   Diagnosis Date Noted  . S/P cesarean section - triplets 11/29 02/17/2012  . Postpartum care following cesarean delivery (11/29) 02/17/2012  . Rh negative, maternal 02/17/2012  . 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 Routine general medical examination at a health care facility 06/23/2010  . FIBROCYSTIC BREAST DISEASE 07/17/2008  . Asthma, 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  Joneen Boers PT, DPT   05/01/2019, 12:04 PM  [facility] [address] [place], [address] Phone: [phone]   Fax:  [phone]  Name: Nicole Owens MRN: [id number] Date of Birth: Sep 25, 1980

## 2019-05-05 ENCOUNTER — Other Ambulatory Visit: Payer: Self-pay

## 2019-05-05 ENCOUNTER — Ambulatory Visit: Payer: BC Managed Care – PPO

## 2019-05-05 DIAGNOSIS — M6281 Muscle weakness (generalized): Secondary | ICD-10-CM | POA: Diagnosis not present

## 2019-05-05 DIAGNOSIS — M25551 Pain in right hip: Secondary | ICD-10-CM | POA: Diagnosis not present

## 2019-05-05 NOTE — Therapy (Signed)
[facility] [address] [place], [address] Phone: [phone]   Fax:  [phone]  Physical Therapy Treatment  Patient Details  Name: Nicole Owens MRN: [id number] Date of Birth: [birth year] Referring Provider (PT): Lynne Leader, MD   Encounter Date: 05/05/2019  PT End of Session - 05/05/19 0901    Visit Number  12    Number of Visits  17    Date for PT Re-Evaluation  05/29/19    PT Start Time  0901    PT Stop Time  0952    PT Time Calculation (min)  51 min    Activity Tolerance  Patient tolerated treatment well    Behavior During Therapy  [facility] for tasks assessed/performed       Past Medical History:  Diagnosis Date  . Asthma   . Asthma, intermittent    mild  . Carpal tunnel syndrome on both sides    with pregnancy  . Female infertility 04/30/2010   history restored after error with record merge  . Heartburn in pregnancy    nausea  . 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 Postpartum care following cesarean delivery 02/17/2012  . Rh negative, maternal 02/17/2012  . S/P cesarean section - triplets 11/29 02/17/2012    Past Surgical History:  Procedure Laterality Date  . CESAREAN SECTION  02/16/2012   Procedure: CESAREAN SECTION;  Surgeon: Lovenia Kim, MD;  Location: [facility];  Service: Obstetrics;  Laterality: N/A;  . NO PAST SURGERIES    . WISDOM TOOTH EXTRACTION      There were no vitals filed for this visit.  Subjective Assessment - 05/05/19 0902    Subjective  R hip is a little sore, not sure why. Did her exercises last night but has not done them since last Thursday. 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 R hip was fine after last session. Stepping up onto a high step stool with her R foot is the only time her R hip bothers her at home now.    Pertinent History  R hip pain. Pain began mid November 2020. Pt was at the gym. Pt was performing scissor kicks. Felt a little  pain more than normal. Went for a backward lunge with R LE and felt sharp pain R lower abdomen. Took 10 days off and iced that area. Did not really get better. Was diagnosed with sports hernia. Still getting pain but getting a little better. Could not jog before 6 weeks ago. Thinks she can jog slowly now. Using her R LE to stand up from a R half kneeling position bothers her. Pt goes to the gym 6 days a week before, no goes to the gym 1 time a week and performs modified upper body work out. Pivoting the wrong way sometimes would hurt. Going up the steps 2 at a time bothers her R hip. Getting out of bed involving SLR hip flexion movement would bother it. Coughing, sneezing would bother R hip. Had C section 7 years ago for triplets. The pain is along the line of the incision.    Patient Stated Goals  Anything in the lunge position, be able to run (pt runs half marathons)    Currently in Pain?  Yes    Pain Score  3  PT Education - 05/05/19 0904    Education Details  ther-ex    Person(s) Educated  Patient    Methods  Explanation;Demonstration;Tactile cues;Verbal cues    Comprehension  Returned demonstration;Verbalized understanding      Objective   No latex allergies    MedbridgeAccess Code: [email or web address]   Pt wants to work up to being able to do planks, pushups, sit ups, spider and mountain climbers.  Therapeutic exercise     Mini/light jog 500 ft to the R, then 500 ft to the L, small stepsso as to not irritate L hip adductor muscles No pain  Standing B gastroc stretch on rocker board with B UE assist 30 seconds x 3   Seated hip adduction ball squeeze 10x10 seconds for 2 sets   R S/L R hip assisted adduction with PT 10x3, discomfort free level of effort    Supine R hip adduction isometrics 10x5 seconds for 3 sets. Muscle fatigue felt  Better tolerance today compared to last week   Standing R hip  adduction with R foot furniture slider 10x3 pain free level of effort.   Standing R hip flexion/extension with R foot on furniture slider, pain free level of effort 10x3   ladder drills (small steps) next visit if appropriate  Forward in and out 6x  Lateral in and out 3x each way   SLS on R LE with R foot on Air Ex pad 1 min  Then with 2 kg ball toss 20x2  Good R hip adductor muscle use felt, no pain.    Wall push-ups 10x  Kneeling planks 15 seconds. Discomfort. Eases with rest    Forward step up onto a step stool with L hand assist 10x,   Then stepping up onto a 12 inch step stool with L UE assist 10x with glute max squeeze. No pain with glute squeeze.   Add pallof press next visit if appropriate.    Improved exercise technique, movement at target joints, use of target muscles aftermin tomod verbal, visual, tactile cues.      Response to treatment Pt tolerated session well without aggravation of symptoms.  Clinical presentation  Pt better able to tolerate S/L assisted hip adduction, supine resisted hip adduction isometrics today compared to previous sessions. Upgraded SLS with weighted ball toss to use of Air Ex pad on which pt felt good hip adductor muscle use without complain of pain. Continued promoting proper stress to hip adductor and distal abdominal muscles to promote healing and to continue to increase load tolerance. Pt tolerated session well without aggravation of symptoms. PT will benefit from continued skilled physical therapy services to decrease pain, improve strength and function.     PT Short Term Goals - 05/01/19 1149      PT SHORT TERM GOAL #1   Title  Patient will be independent with her HEP to decrease pain, improve strength and function.    Baseline  Pt has started her HEP (03/19/2019); Pt demonstrates independence with her HEP (05/01/2019)    Time  3    Period  Weeks    Status  Achieved    Target Date  04/10/19        PT Long  Term Goals - 05/01/19 0919      PT LONG TERM GOAL #1   Title  Pt will have a decrease in R hip pain to 2/10 or less at worst to promote ability to run, perform exercises, stand up from a half kneeling position more comfortably.  Baseline  6/10 R hip pain at most (03/19/2019); 2-3/10 at most for the past 7 days (05/01/2019)    Time  4    Period  Weeks    Status  Partially Met    Target Date  05/29/19      PT LONG TERM GOAL #2   Title  Patient will improve R hip flexion, extension, abduction, and adduction strength by at least 1/2 MMT grade to promote ability to perform functional tasks more comfortably.    Time  4    Period  Weeks    Status  Partially Met    Target Date  05/29/19      PT LONG TERM GOAL #3   Title  Pt will be able to jog for at least 5 minutes without R hip pain to promote ability to participate in exercise and promote physical health    Baseline  increased R hip pain when jogging or running (03/19/2019); able to perform mini jog with short stride length (05/01/2019)    Time  4    Period  Weeks    Status  Partially Met    Target Date  05/29/19            Plan - 05/05/19 0905    Clinical Impression Statement  Pt better able to tolerate S/L assisted hip adduction, supine resisted hip adduction isometrics today compared to previous sessions. Upgraded SLS with weighted ball toss to use of Air Ex pad on which pt felt good hip adductor muscle use without complain of pain. Continued promoting proper stress to hip adductor and distal abdominal muscles to promote healing and to continue to increase load tolerance. Pt tolerated session well without aggravation of symptoms. PT will benefit from continued skilled physical therapy services to decrease pain, improve strength and function.    Personal Factors and Comorbidities  Comorbidity 1    Comorbidities  hx of C-section    Examination-Activity Limitations  Squat    Rehab Potential  Good    PT Frequency  2x / week    PT  Duration  6 weeks    PT Treatment/Interventions  Therapeutic activities;Therapeutic exercise;Neuromuscular re-education;Patient/family education;Manual techniques;Dry needling;Spinal Manipulations;Joint Manipulations;Aquatic Therapy;Electrical Stimulation;Iontophoresis 70m/ml Dexamethasone;Ultrasound    PT Next Visit Plan  isometric, then concentric, then eccentric hip and abdominal strengthening, manual techniques, modalities PRN    Consulted and Agree with Plan of Care  Patient       Patient will benefit from skilled therapeutic intervention in order to improve the following deficits and impairments:  Pain, Improper body mechanics, Postural dysfunction, Decreased strength  Visit Diagnosis: Muscle weakness (generalized)  Pain in right hip     Problem List Patient Active Problem List   Diagnosis Date Noted  . S/P cesarean section - triplets 11/29 02/17/2012  . Postpartum care following cesarean delivery (11/29) 02/17/2012  . Rh negative, maternal 02/17/2012  . 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 Routine general medical examination at a health care facility 06/23/2010  . FIBROCYSTIC BREAST DISEASE 07/17/2008  . Asthma, 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    MJoneen BoersPT, DPT   05/05/2019, 10:08 AM  [facility] [address] [place] [address]Phone: [phone]  Fax:  [phone] Name: Nicole LIERAMRN: [id number] of Birth: [birth year]

## 2019-05-12 ENCOUNTER — Other Ambulatory Visit: Payer: Self-pay

## 2019-05-12 ENCOUNTER — Ambulatory Visit: Payer: BC Managed Care – PPO

## 2019-05-12 DIAGNOSIS — M6281 Muscle weakness (generalized): Secondary | ICD-10-CM

## 2019-05-12 DIAGNOSIS — M25551 Pain in right hip: Secondary | ICD-10-CM | POA: Diagnosis not present

## 2019-05-12 NOTE — Therapy (Signed)
[facility] [address] [place], [address] Phone: [phone]   Fax:  [phone]  Physical Therapy Treatment And Progress Report  Patient Details  Name: Nicole Owens MRN: [id number] Date of Birth: 10/12/1980 Referring Provider (PT): Lynne Leader, MD   Encounter Date: 05/12/2019  PT End of Session - 05/12/19 0852    Visit Number  13    Number of Visits  17    Date for PT Re-Evaluation  05/29/19    PT Start Time  0853    PT Stop Time  0934    PT Time Calculation (min)  41 min    Activity Tolerance  Patient tolerated treatment well    Behavior During Therapy  [facility] for tasks assessed/performed       Past Medical History:  Diagnosis Date  . Asthma   . Asthma, intermittent    mild  . Carpal tunnel syndrome on both sides    with pregnancy  . Female infertility 04/30/2010   history restored after error with record merge  . Heartburn in pregnancy    nausea  . 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 Postpartum care following cesarean delivery 02/17/2012  . Rh negative, maternal 02/17/2012  . S/P cesarean section - triplets 11/29 02/17/2012    Past Surgical History:  Procedure Laterality Date  . CESAREAN SECTION  02/16/2012   Procedure: CESAREAN SECTION;  Surgeon: Lovenia Kim, MD;  Location: [facility];  Service: Obstetrics;  Laterality: N/A;  . NO PAST SURGERIES    . WISDOM TOOTH EXTRACTION      There were no vitals filed for this visit.  Subjective Assessment - 05/12/19 0853    Subjective  R hip is a little sore, not sure why. Has been sore all week. Not excruciating, Has not done anything workout wise since last Tuesday. Has not really done anything different. Had been doing the furniture slider exercises at home. Did not hurt while she did the exercise. Has not been doing the hip adduction 40% squeeze.    Pertinent History  R hip pain. Pain began mid November 2020. Pt was at the gym. Pt was  performing scissor kicks. Felt a little pain more than normal. Went for a backward lunge with R LE and felt sharp pain R lower abdomen. Took 10 days off and iced that area. Did not really get better. Was diagnosed with sports hernia. Still getting pain but getting a little better. Could not jog before 6 weeks ago. Thinks she can jog slowly now. Using her R LE to stand up from a R half kneeling position bothers her. Pt goes to the gym 6 days a week before, no goes to the gym 1 time a week and performs modified upper body work out. Pivoting the wrong way sometimes would hurt. Going up the steps 2 at a time bothers her R hip. Getting out of bed involving SLR hip flexion movement would bother it. Coughing, sneezing would bother R hip. Had C section 7 years ago for triplets. The pain is along the line of the incision.    Patient Stated Goals  Anything in the lunge position, be able to run (pt runs half marathons)    Currently in Pain?  No/denies   Just muscle soreness                              PT Education -  05/12/19 0901    Education Details  ther-ex    Person(s) Educated  Patient    Methods  Explanation;Demonstration;Tactile cues;Verbal cues    Comprehension  Returned demonstration;Verbalized understanding      Objective   No latex allergies    MedbridgeAccess Code: [email or web address]   Pt wants to work up to being able to do planks, pushups, sit ups, spider and mountain climbers.  Therapeutic exercise     Mini/light jog 500 ft to the R, then 500 ft to the L, small stepsso as to not irritate L hip adductor muscles No pain with small steps    Standing B gastroc stretch on rocker board with B UE assist 30 seconds x 3  Supine R hip adduction isometrics 10x5 seconds for 3 sets. Muscle fatigue felt             Better tolerance today compared to last week   R S/L R hip assisted adduction with PT 10x3, discomfort free level of  effort  hooklying hip adduction ball squeeze at 40% effort 1 min with 1 min rest breaks 5x  No soreness or pain in R hip afterwards   Wall push-ups 10x2   SLS on R LE with R foot on Air Ex pad 1 min             Then with 2 kg ball toss 20x2   Improved exercise technique, movement at target joints, use of target muscles aftermin tomod verbal, visual, tactile cues.      Response to treatment Pt tolerated session well without aggravation of symptoms.  Clinical presentation  Pt returns to PT session today with R hip soreness. Continued working on isometric hip adductor muscle activation to promote proper stress to healing tissues. No complain of pain or soreness in her R hip after session. Overall, pt seems to be making some progress with decreasing R hip (proximal hip adductor, distal abdominal, and iliopsoas muscles) pain since initial evaluation based on subjective reports and clinical presentation. Pt currently able to jog utilizing small steps without complain of pain. Pt will benefit from continued skilled physical therapy services to continue to improve strength, function, and decrease overall hip pain and to help pt return to her normal exercise routine.     PT Short Term Goals - 05/01/19 1149      PT SHORT TERM GOAL #1   Title  Patient will be independent with her HEP to decrease pain, improve strength and function.    Baseline  Pt has started her HEP (03/19/2019); Pt demonstrates independence with her HEP (05/01/2019)    Time  3    Period  Weeks    Status  Achieved    Target Date  04/10/19        PT Long Term Goals - 05/12/19 0917      PT LONG TERM GOAL #1   Title  Pt will have a decrease in R hip pain to 2/10 or less at worst to promote ability to run, perform exercises, stand up from a half kneeling position more comfortably.    Baseline  6/10 R hip pain at most (03/19/2019); 2-3/10 at most for the past 7 days (05/01/2019); 2-3/10 at most for the  past 7 days (05/12/19)    Time  4    Period  Weeks    Status  Partially Met      PT LONG TERM GOAL #2   Title  Patient will improve R hip flexion, extension, abduction, and  adduction strength by at least 1/2 MMT grade to promote ability to perform functional tasks more comfortably.    Time  4    Period  Weeks    Status  Partially Met      PT LONG TERM GOAL #3   Title  Pt will be able to jog for at least 5 minutes without R hip pain to promote ability to participate in exercise and promote physical health    Baseline  increased R hip pain when jogging or running (03/19/2019); able to perform mini jog with short stride length (05/01/2019)    Time  4    Period  Weeks    Status  Partially Met            Plan - 05/12/19 0902    Clinical Impression Statement  Pt returns to PT session today with R hip soreness. Continued working on isometric hip adductor muscle activation to promote proper stress to healing tissues. No complain of pain or soreness in her R hip after session. Overall, pt seems to be making some progress with decreasing R hip (proximal hip adductor, distal abdominal, and iliopsoas muscles) pain since initial evaluation based on subjective reports and clinical presentation. Pt currently able to jog utilizing small steps without complain of pain. Pt will benefit from continued skilled physical therapy services to continue to improve strength, function, and decrease overall hip pain and to help pt return to her normal exercise routine.    Personal Factors and Comorbidities  Comorbidity 1    Comorbidities  hx of C-section    Examination-Activity Limitations  Squat    Stability/Clinical Decision Making  Stable/Uncomplicated    Clinical Decision Making  Low    Rehab Potential  Good    PT Frequency  2x / week    PT Duration  6 weeks    PT Treatment/Interventions  Therapeutic activities;Therapeutic exercise;Neuromuscular re-education;Patient/family education;Manual techniques;Dry  needling;Spinal Manipulations;Joint Manipulations;Aquatic Therapy;Electrical Stimulation;Iontophoresis 30m/ml Dexamethasone;Ultrasound    PT Next Visit Plan  isometric, then concentric, then eccentric hip and abdominal strengthening, manual techniques, modalities PRN    Consulted and Agree with Plan of Care  Patient       Patient will benefit from skilled therapeutic intervention in order to improve the following deficits and impairments:  Pain, Improper body mechanics, Postural dysfunction, Decreased strength  Visit Diagnosis: Muscle weakness (generalized)  Pain in right hip     Problem List Patient Active Problem List   Diagnosis Date Noted  . S/P cesarean section - triplets 11/29 02/17/2012  . Postpartum care following cesarean delivery (11/29) 02/17/2012  . Rh negative, maternal 02/17/2012  . 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 Routine general medical examination at a health care facility 06/23/2010  . FIBROCYSTIC BREAST DISEASE 07/17/2008  . Asthma, 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    Thank you for your referral.   MJoneen BoersPT, DPT   05/12/2019, 9:52 AM  [facility] [address] [place] [address]Phone: [phone]  Fax:  [phone] Name: Nicole DAIDONEMRN: [id number] of Birth: 904-29-1982

## 2019-05-13 ENCOUNTER — Encounter: Payer: Self-pay | Admitting: Family Medicine

## 2019-05-13 ENCOUNTER — Ambulatory Visit (INDEPENDENT_AMBULATORY_CARE_PROVIDER_SITE_OTHER): Payer: BC Managed Care – PPO

## 2019-05-13 ENCOUNTER — Ambulatory Visit: Payer: BC Managed Care – PPO | Admitting: Family Medicine

## 2019-05-13 VITALS — BP 122/76 | HR 86 | Ht 63.0 in | Wt 151.0 lb

## 2019-05-13 DIAGNOSIS — K4090 Unilateral inguinal hernia, without obstruction or gangrene, not specified as recurrent: Secondary | ICD-10-CM | POA: Diagnosis not present

## 2019-05-13 DIAGNOSIS — S3981XA Other specified injuries of abdomen, initial encounter: Secondary | ICD-10-CM

## 2019-05-13 NOTE — Patient Instructions (Addendum)
Thank you for coming in today. I will plan for MRI near [place].  You should hear soon about MRI scheduling.  Let me know if you do not hear anything in 1 week or if you have a problem.  Plan to recheck following MRI to discuss results and look at the pictures.   Get xray today.

## 2019-05-13 NOTE — Progress Notes (Signed)
I, Nicole Owens, LAT, ATC, am serving as scribe for Dr. Lynne Owens.  Nicole Owens is a 39 y.o. female who presents to [facility] [facility] today for R hip/groin pain after injuring herself while doing reverse lunges during a workout.  She was last seen by Dr. [name] on 02/10/19 and referred to outpatient PT of which she has completed 13 visits.  Since her last visit, pt reports that she is doing "a lot better" and feels like she's nearly back to normal.  She feels pain at this point w/ step-ups leading w/ the R leg and w/ prone R hip extension and extension/aBd.  She has resumed some light jogging.  She is also having difficulty w/ planks due to pain in her R anterior hip.   Pertinent review of systems: No fever or chills  Relevant historical information: No history of hernia   Exam:  BP 122/76 (BP Location: Left Arm, Patient Position: Sitting, Cuff Size: Normal)   Pulse 86   Ht 5\' 3"  (1.6 m)   Wt 151 lb (68.5 kg)   LMP 05/13/2019   SpO2 98%   BMI 26.75 kg/m  General: Well Developed, well nourished, and in no acute distress.   MSK: Right Hip and abdomen.  Right hip normal-appearing. Normal motion. Mildly tender palpation right side of pubic symphysis.  Nontender anterior hip otherwise or into the lateral hip. Pain with resisted hip flexion and hip adduction.     Lab and Radiology Results  X-ray images pelvis obtained today personally and independently reviewed. Normal-appearing pelvis without fracture significant malalignment degenerative changes or tumor. Await formal radiology review    Assessment and Plan: 39 y.o. female with right pelvis/hip pain.  Pain ongoing for months now feeling dedicated trial of physical therapy greater than 6 weeks.  At this point diagnosis is sports hernia.  Plan for MRI for surgical or potential injection planning.  Will obtain MRI pelvis without contrast with sports hernia protocol.  Follow-up after MRI.  Next steps  would be surgery or PRP injection possibly.    Orders Placed This Encounter  Procedures  . DG Pelvis 1-2 Views    Standing Status:   Future    Number of Occurrences:   1    Standing Expiration Date:   07/10/2020    Order Specific Question:   Reason for Exam (SYMPTOM  OR DIAGNOSIS REQUIRED)    Answer:   eval rt sports hernia prior to MRI    Order Specific Question:   Is patient pregnant?    Answer:   No    Order Specific Question:   Preferred imaging location?    Answer:   [name]    Order Specific Question:   Radiology Contrast Protocol - do NOT remove file path    Answer:   \\[email or web address].pdf  . MR PELVIS WO CONTRAST    Sports Hernia Protocol    Standing Status:   Future    Standing Expiration Date:   07/10/2020    Scheduling Instructions:     Sports Hernia Protocol    Order Specific Question:   ** REASON FOR EXAM (FREE TEXT)    Answer:   Sports Hernia protocol    Order Specific Question:   What is the patient's sedation requirement?    Answer:   No Sedation    Order Specific Question:   Does the patient have a pacemaker or implanted devices?    Answer:   No  Order Specific Question:   Preferred imaging location?    Answer:   [facility] (table limit-350lbs)    Order Specific Question:   Radiology Contrast Protocol - do NOT remove file path    Answer:   \\[email or web address]\[email or web address]\[email or web address].PDF   No orders of the defined types were placed in this encounter.    Discussed warning signs or symptoms. Please see discharge instructions. Patient expresses understanding.   The above documentation has been reviewed and is accurate and complete [name]

## 2019-05-13 NOTE — Progress Notes (Signed)
X-ray pelvis is normal.  MRI will be helpful has already been ordered.

## 2019-05-19 ENCOUNTER — Other Ambulatory Visit: Payer: Self-pay

## 2019-05-19 ENCOUNTER — Ambulatory Visit: Payer: BC Managed Care – PPO | Attending: Family Medicine

## 2019-05-19 DIAGNOSIS — M25551 Pain in right hip: Secondary | ICD-10-CM

## 2019-05-19 DIAGNOSIS — M6281 Muscle weakness (generalized): Secondary | ICD-10-CM | POA: Insufficient documentation

## 2019-05-19 NOTE — Therapy (Signed)
[facility] [address], [place], [address] Phone: [phone]   Fax:  [phone]  Physical Therapy Treatment  Patient Details  Name: Nicole Owens MRN: [id number] Date of Birth: 1981-01-04 Referring Provider (PT): Lynne Leader, MD   Encounter Date: 05/19/2019  PT End of Session - 05/19/19 0809    Visit Number  14    Number of Visits  17    Date for PT Re-Evaluation  05/29/19    PT Start Time  0809    PT Stop Time  0859    PT Time Calculation (min)  50 min    Activity Tolerance  Patient tolerated treatment well    Behavior During Therapy  [facility] for tasks assessed/performed       Past Medical History:  Diagnosis Date  . Asthma   . Asthma, intermittent    mild  . Carpal tunnel syndrome on both sides    with pregnancy  . Female infertility 04/30/2010   history restored after error with record merge  . Heartburn in pregnancy    nausea  . 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 Postpartum care following cesarean delivery 02/17/2012  . Rh negative, maternal 02/17/2012  . S/P cesarean section - triplets 11/29 02/17/2012    Past Surgical History:  Procedure Laterality Date  . CESAREAN SECTION  02/16/2012   Procedure: CESAREAN SECTION;  Surgeon: Lovenia Kim, MD;  Location: [facility];  Service: Obstetrics;  Laterality: N/A;  . NO PAST SURGERIES    . WISDOM TOOTH EXTRACTION      There were no vitals filed for this visit.  Subjective Assessment - 05/19/19 0811    Subjective  R hip has been sore off and on but has done a little more than normal. Able to do the hip adduction ball exercise. No pain currently, just the muscle soreness. Stepping on a stool or stepping on R LE bothers it. Did planks for 40 seconds with knees bent and was fine. Might end up getting an MRI.    Pertinent History  R hip pain. Pain began mid November 2020. Pt was at the gym. Pt was performing scissor kicks. Felt a little pain  more than normal. Went for a backward lunge with R LE and felt sharp pain R lower abdomen. Took 10 days off and iced that area. Did not really get better. Was diagnosed with sports hernia. Still getting pain but getting a little better. Could not jog before 6 weeks ago. Thinks she can jog slowly now. Using her R LE to stand up from a R half kneeling position bothers her. Pt goes to the gym 6 days a week before, no goes to the gym 1 time a week and performs modified upper body work out. Pivoting the wrong way sometimes would hurt. Going up the steps 2 at a time bothers her R hip. Getting out of bed involving SLR hip flexion movement would bother it. Coughing, sneezing would bother R hip. Had C section 7 years ago for triplets. The pain is along the line of the incision.    Patient Stated Goals  Anything in the lunge position, be able to run (pt runs half marathons)    Currently in Pain?  No/denies    Pain Score  0-No pain  PT Education - 05/19/19 0815    Education Details  ther-ex    Person(s) Educated  Patient    Methods  Explanation;Demonstration;Tactile cues;Verbal cues    Comprehension  Returned demonstration;Verbalized understanding      Objective   No latex allergies    MedbridgeAccess Code: [email or web address]   Pt wants to work up to being able to do planks, pushups, sit ups, spider and mountain climbers.  Therapeutic exercise     Mini/light jog 500 ft to the R, then 500 ft to the L, small stepsso as to not irritate L hip adductor muscles No pain with small steps   Standing B gastroc stretch on rocker board with B UE assist 2 min with 5 second gasroc stretches  Supine L lower trunk rotation 10x5 seconds for 3 sets (R pelvic rotation posture palpated)  Less pain with supine SLR hip flexion R hip   R S/L R hip assisted adduction with PT 10x3. Minimal level of discomfort felt. Eases with rest  Supine L  lower trunk rotation isometrics at neutral 10x5 seconds for 3 sets (R pelvic rotation posture palpated)     Supine with B UE in 90 degrees shoulder flexion  Manually resisted R trunk rotation isometrics to promote L pelvic rotation (to decrease R pelvic rotation posture to promote neutral pelvis) 10x5 seconds     Supine R hip adduction isometrics 10x5 secondsfor 2 sets, pain/discomfort free level of effort.    Improved exercise technique, movement at target joints, use of target muscles aftermin tomod verbal, visual, tactile cues.       Response to treatment Pt tolerated session well without aggravation of symptoms.  Clinical presentation No complain of pain after session. Just about 2/10 level of discomfort/pain with adductor and hip flexor muscle activation with gentle exercises, eases with rest. Some improvement with ability to perform exercises such as plank with knee bent based on subjective reports. Continued working on gentle muscle activation to promote proper healing of tissues. Pt will benefit from continued skilled physical therapy services to decrease pain, improve strength, function, ability to run and return to her normal workout routine.    PT Short Term Goals - 05/01/19 1149      PT SHORT TERM GOAL #1   Title  Patient will be independent with her HEP to decrease pain, improve strength and function.    Baseline  Pt has started her HEP (03/19/2019); Pt demonstrates independence with her HEP (05/01/2019)    Time  3    Period  Weeks    Status  Achieved    Target Date  04/10/19        PT Long Term Goals - 05/12/19 0917      PT LONG TERM GOAL #1   Title  Pt will have a decrease in R hip pain to 2/10 or less at worst to promote ability to run, perform exercises, stand up from a half kneeling position more comfortably.    Baseline  6/10 R hip pain at most (03/19/2019); 2-3/10 at most for the past 7 days (05/01/2019); 2-3/10 at most for the past 7 days  (05/12/19)    Time  4    Period  Weeks    Status  Partially Met      PT LONG TERM GOAL #2   Title  Patient will improve R hip flexion, extension, abduction, and adduction strength by at least 1/2 MMT grade to promote ability to perform functional tasks more comfortably.    Time  4    Period  Weeks    Status  Partially Met      PT LONG TERM GOAL #3   Title  Pt will be able to jog for at least 5 minutes without R hip pain to promote ability to participate in exercise and promote physical health    Baseline  increased R hip pain when jogging or running (03/19/2019); able to perform mini jog with short stride length (05/01/2019)    Time  4    Period  Weeks    Status  Partially Met            Plan - 05/19/19 0816    Clinical Impression Statement  No complain of pain after session. Just about 2/10 level of discomfort/pain with adductor and hip flexor muscle activation with gentle exercises, eases with rest. Some improvement with ability to perform exercises such as plank with knee bent based on subjective reports. Continued working on gentle muscle activation to promote proper healing of tissues. Pt will benefit from continued skilled physical therapy services to decrease pain, improve strength, function, ability to run and return to her normal workout routine.    Personal Factors and Comorbidities  Comorbidity 1    Comorbidities  hx of C-section    Examination-Activity Limitations  Squat    Stability/Clinical Decision Making  Stable/Uncomplicated    Rehab Potential  Good    PT Frequency  2x / week    PT Duration  6 weeks    PT Treatment/Interventions  Therapeutic activities;Therapeutic exercise;Neuromuscular re-education;Patient/family education;Manual techniques;Dry needling;Spinal Manipulations;Joint Manipulations;Aquatic Therapy;Electrical Stimulation;Iontophoresis 62m/ml Dexamethasone;Ultrasound    PT Next Visit Plan  isometric, then concentric, then eccentric hip and abdominal  strengthening, manual techniques, modalities PRN    Consulted and Agree with Plan of Care  Patient       Patient will benefit from skilled therapeutic intervention in order to improve the following deficits and impairments:  Pain, Improper body mechanics, Postural dysfunction, Decreased strength  Visit Diagnosis: Muscle weakness (generalized)  Pain in right hip     Problem List Patient Active Problem List   Diagnosis Date Noted  . S/P cesarean section - triplets 11/29 02/17/2012  . Postpartum care following cesarean delivery (11/29) 02/17/2012  . Rh negative, maternal 02/17/2012  . 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 Routine general medical examination at a health care facility 06/23/2010  . FIBROCYSTIC BREAST DISEASE 07/17/2008  . Asthma, 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   MJoneen BoersPT, DPT   05/19/2019, 9:20 AM  [facility] [address] [place] [address]Phone: [phone]  Fax:  [phone] Name: Nicole SCREWSMRN: [id number] of Birth: 907/18/1982

## 2019-05-26 ENCOUNTER — Encounter: Payer: Self-pay | Admitting: Family Medicine

## 2019-05-26 ENCOUNTER — Other Ambulatory Visit: Payer: Self-pay

## 2019-05-26 ENCOUNTER — Ambulatory Visit: Payer: BC Managed Care – PPO

## 2019-05-26 DIAGNOSIS — M6281 Muscle weakness (generalized): Secondary | ICD-10-CM | POA: Diagnosis not present

## 2019-05-26 DIAGNOSIS — M25551 Pain in right hip: Secondary | ICD-10-CM | POA: Diagnosis not present

## 2019-05-26 NOTE — Addendum Note (Signed)
Addended by: Charlene Brooke on: 05/26/2019 02:31 PM   Modules accepted: Orders

## 2019-05-26 NOTE — Therapy (Addendum)
[facility] [address] [place], [address] Phone: [phone]   Fax:  [phone]  Physical Therapy Treatment  Patient Details  Name: Nicole Owens MRN: [id number] Date of Birth: 24-Dec-1980 Referring Provider (PT): Lynne Leader, MD   Encounter Date: 05/26/2019  PT End of Session - 05/26/19 0805    Visit Number  15    Number of Visits  23    Date for PT Re-Evaluation  07/10/19    PT Start Time  0805    PT Stop Time  0848    PT Time Calculation (min)  43 min    Activity Tolerance  Patient tolerated treatment well    Behavior During Therapy  [facility] for tasks assessed/performed       Past Medical History:  Diagnosis Date  . Asthma   . Asthma, intermittent    mild  . Carpal tunnel syndrome on both sides    with pregnancy  . Female infertility 04/30/2010   history restored after error with record merge  . Heartburn in pregnancy    nausea  . 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 Postpartum care following cesarean delivery 02/17/2012  . Rh negative, maternal 02/17/2012  . S/P cesarean section - triplets 11/29 02/17/2012    Past Surgical History:  Procedure Laterality Date  . CESAREAN SECTION  02/16/2012   Procedure: CESAREAN SECTION;  Surgeon: Lovenia Kim, MD;  Location: [facility];  Service: Obstetrics;  Laterality: N/A;  . NO PAST SURGERIES    . WISDOM TOOTH EXTRACTION      There were no vitals filed for this visit.  Subjective Assessment - 05/26/19 0806    Subjective  R hip is sore but not hurting sore. Not painful. Feels like an exercise sore. 2-3/10 R hip pain at most for the past 7 days. Function has been better. Getting onto her bed, stepping onto a step stool does not really hurt.  Squats at the gym felt good. Wants to be able to jump and perform planks more comfortably.    Pertinent History  R hip pain. Pain began mid November 2020. Pt was at the gym. Pt was performing scissor kicks.  Felt a little pain more than normal. Went for a backward lunge with R LE and felt sharp pain R lower abdomen. Took 10 days off and iced that area. Did not really get better. Was diagnosed with sports hernia. Still getting pain but getting a little better. Could not jog before 6 weeks ago. Thinks she can jog slowly now. Using her R LE to stand up from a R half kneeling position bothers her. Pt goes to the gym 6 days a week before, no goes to the gym 1 time a week and performs modified upper body work out. Pivoting the wrong way sometimes would hurt. Going up the steps 2 at a time bothers her R hip. Getting out of bed involving SLR hip flexion movement would bother it. Coughing, sneezing would bother R hip. Had C section 7 years ago for triplets. The pain is along the line of the incision.    Patient Stated Goals  Anything in the lunge position, be able to run (pt runs half marathons)    Currently in Pain?  No/denies    Pain Score  0-No pain         [facility] PT Assessment - 05/26/19 0829      Strength   Right Hip Flexion  4+/5  Right Hip Extension  4+/5    Right Hip ABduction  5/5    Right Hip ADduction  4/5   3/10 pain                          PT Education - 05/26/19 0820    Education Details  ther-ex, plan of care    Person(s) Educated  Patient    Methods  Explanation;Demonstration;Tactile cues;Verbal cues    Comprehension  Returned demonstration;Verbalized understanding      Objective   No latex allergies    MedbridgeAccess Code: [email or web address]   Pt wants to work up to being able to do planks, pushups, sit ups, spider and mountain climbers, jumps  Therapeutic exercise   Reviewed plan of care: continue 1x/per week for 6 weeks.    Mini/light jog 500 ft to the R, then 500 ft to the L, small stepsso as to not irritate L hip adductor muscles No painwith small steps  Total Gym mini jumps, height: 15  Double legs 20x. 3/10 pain  level  Then height 5 for 20x. 1-2/10 pain level   Pain when maintaining slight hip flexion during the jump.    Seated manually resisted R hio flexion, extension, abduction, adduction 1-2x each way  Improved strength all directions. Only pain is with adduction.   Seated manually resisted trunk rotation isometrics   L 10x5 seconds for 3 sets  R 10x5 seconds for 3 sets   sreated manually resisted trunk flexion isometrics   10x2 with 5 second holds  Supine assisted R supine hip flexion SLR pain free level of effort 10x   Supine R hip adduction isometrics 10x5 secondsfor 1 set, pain/discomfort free level of effort.     Improved exercise technique, movement at target joints, use of target muscles aftermin tomod verbal, visual, tactile cues.  Response to treatment Pt tolerated session well without aggravation of symptoms.    Clinical presentation Improved overall R hip strength and decreased overall pain. Pt sitll demosntrates symptoms with hip adduction, and some hip flexion. Overall improving comfort level with functionl tasks such as bed mobility and stepping onto a stool based on subjective reports. Pt also currently able to perform light jogs with small steps without pain. Pt making slow progress towards goals. Pt will benefit from continued skilled physical therapy services to decrease pain, improve strength and function.    PT Short Term Goals - 05/01/19 1149      PT SHORT TERM GOAL #1   Title  Patient will be independent with her HEP to decrease pain, improve strength and function.    Baseline  Pt has started her HEP (03/19/2019); Pt demonstrates independence with her HEP (05/01/2019)    Time  3    Period  Weeks    Status  Achieved    Target Date  04/10/19        PT Long Term Goals - 05/26/19 0828      PT LONG TERM GOAL #1   Title  Pt will have a decrease in R hip pain to 2/10 or less at worst to promote ability to run, perform exercises, stand up from a  half kneeling position more comfortably.    Baseline  6/10 R hip pain at most (03/19/2019); 2-3/10 at most for the past 7 days (05/01/2019); 2-3/10 at most for the past 7 days (05/12/19), (05/26/2019)    Time  6    Period  Weeks    Status  Partially Met    Target Date  07/10/19      PT LONG TERM GOAL #2   Title  Patient will improve R hip flexion, extension, abduction, and adduction strength by at least 1/2 MMT grade to promote ability to perform functional tasks more comfortably.    Baseline  improved by 1/2 MMT grade but pain with hip adduction (05/26/2019)    Time  6    Period  Weeks    Status  Achieved    Target Date  07/10/19      PT LONG TERM GOAL #3   Title  Pt will be able to jog for at least 5 minutes without R hip pain to promote ability to participate in exercise and promote physical health    Baseline  increased R hip pain when jogging or running (03/19/2019); able to perform mini jog with short stride length (05/01/2019), (05/26/2019)    Time  6    Period  Weeks    Status  Partially Met    Target Date  07/10/19            Plan - 05/26/19 1426    Clinical Impression Statement  Improved overall R hip strength and decreased overall pain. Pt sitll demosntrates symptoms with hip adduction, and some hip flexion. Overall improving comfort level with functionl tasks such as bed mobility and stepping onto a stool based on subjective reports. Pt also currently able to perform light jogs with small steps without pain. Pt making slow progress towards goals. Pt will benefit from continued skilled physical therapy services to decrease pain, improve strength and function.    Personal Factors and Comorbidities  Comorbidity 1    Comorbidities  hx of C-section    Examination-Activity Limitations  Squat    Stability/Clinical Decision Making  Stable/Uncomplicated    Clinical Decision Making  Low    Rehab Potential  Good    PT Frequency  1x / week    PT Duration  6 weeks    PT  Treatment/Interventions  Therapeutic activities;Therapeutic exercise;Neuromuscular re-education;Patient/family education;Manual techniques;Dry needling;Spinal Manipulations;Joint Manipulations;Aquatic Therapy;Electrical Stimulation;Iontophoresis 35m/ml Dexamethasone;Ultrasound    PT Next Visit Plan  isometric, then concentric, then eccentric hip and abdominal strengthening, manual techniques, modalities PRN    PT Home Exercise Plan  Medbridge Access Code: [email or web address]    Consulted and Agree with Plan of Care  Patient       Patient will benefit from skilled therapeutic intervention in order to improve the following deficits and impairments:  Pain, Improper body mechanics, Postural dysfunction, Decreased strength  Visit Diagnosis: Pain in right hip - Plan: PT plan of care cert/re-cert  Muscle weakness (generalized) - Plan: PT plan of care cert/re-cert     Problem List Patient Active Problem List   Diagnosis Date Noted  . S/P cesarean section - triplets 11/29 02/17/2012  . Postpartum care following cesarean delivery (11/29) 02/17/2012  . Rh negative, maternal 02/17/2012  . 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 Routine general medical examination at a health care facility 06/23/2010  . FIBROCYSTIC BREAST DISEASE 07/17/2008  . Asthma, 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    MJoneen BoersPT, DPT   05/26/2019, 2:31 PM  [facility] [address] [place] [address]Phone: [phone]  Fax:  [phone] Name: NBRYSSA TONESMRN: [id number] of Birth: [birth year]

## 2019-06-03 ENCOUNTER — Other Ambulatory Visit: Payer: Self-pay

## 2019-06-03 ENCOUNTER — Ambulatory Visit: Payer: BC Managed Care – PPO

## 2019-06-03 DIAGNOSIS — M6281 Muscle weakness (generalized): Secondary | ICD-10-CM

## 2019-06-03 DIAGNOSIS — M25551 Pain in right hip: Secondary | ICD-10-CM | POA: Diagnosis not present

## 2019-06-03 NOTE — Therapy (Signed)
[facility] [address], [place], [address] Phone: [phone]   Fax:  [phone]  Physical Therapy Treatment  Patient Details  Name: Nicole Owens MRN: [id number] Date of Birth: May 03, 1980 Referring Provider (PT): Lynne Leader, MD   Encounter Date: 06/03/2019  PT End of Session - 06/03/19 1700    Visit Number  16    Number of Visits  23    Date for PT Re-Evaluation  07/10/19    PT Start Time  1700    PT Stop Time  1744    PT Time Calculation (min)  44 min    Activity Tolerance  Patient tolerated treatment well    Behavior During Therapy  [facility] for tasks assessed/performed       Past Medical History:  Diagnosis Date  . Asthma   . Asthma, intermittent    mild  . Carpal tunnel syndrome on both sides    with pregnancy  . Female infertility 04/30/2010   history restored after error with record merge  . Heartburn in pregnancy    nausea  . 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 Postpartum care following cesarean delivery 02/17/2012  . Rh negative, maternal 02/17/2012  . S/P cesarean section - triplets 11/29 02/17/2012    Past Surgical History:  Procedure Laterality Date  . CESAREAN SECTION  02/16/2012   Procedure: CESAREAN SECTION;  Surgeon: Lovenia Kim, MD;  Location: [facility];  Service: Obstetrics;  Laterality: N/A;  . NO PAST SURGERIES    . WISDOM TOOTH EXTRACTION      There were no vitals filed for this visit.  Subjective Assessment - 06/03/19 1702    Subjective  R hip did not bother her much. The only time she noticed it bother her was when she stepped up onto a high step with her R foot.  Did some upper body workouts which did not bother her R hip. Feels like she is getting better. Pt was also able to jump rope at the gym with a cushioned floor. Pt was able to hit a ball and run to first base Sunday when practicing soft ball (medium run, regular steps).    Pertinent History  R hip  pain. Pain began mid November 2020. Pt was at the gym. Pt was performing scissor kicks. Felt a little pain more than normal. Went for a backward lunge with R LE and felt sharp pain R lower abdomen. Took 10 days off and iced that area. Did not really get better. Was diagnosed with sports hernia. Still getting pain but getting a little better. Could not jog before 6 weeks ago. Thinks she can jog slowly now. Using her R LE to stand up from a R half kneeling position bothers her. Pt goes to the gym 6 days a week before, no goes to the gym 1 time a week and performs modified upper body work out. Pivoting the wrong way sometimes would hurt. Going up the steps 2 at a time bothers her R hip. Getting out of bed involving SLR hip flexion movement would bother it. Coughing, sneezing would bother R hip. Had C section 7 years ago for triplets. The pain is along the line of the incision.    Patient Stated Goals  Anything in the lunge position, be able to run (pt runs half marathons)    Currently in Pain?  No/denies  PT Education - 06/03/19 1708    Education Details  ther-ex    Person(s) Educated  Patient    Methods  Explanation;Demonstration;Tactile cues;Verbal cues    Comprehension  Returned demonstration;Verbalized understanding       Objective   No latex allergies    MedbridgeAccess Code: [email or web address]   Pt wants to work up to being able to do planks, pushups, sit ups, spider and mountain climbers, jumps  Therapeutic exercise   Mini/light jog 500 ft to the R, then 500 ft to the L, small steps (but slightly bigger from last session)so as to not irritate L hip adductor muscles No pain  Standing B gastroc stretch at stair step 30 seconds x 3  Standing mini static hops 10x2. No pain  Hopping over line and back 10x3 front and back. No pain.   Side to side hopping over line 10x3. 1/10 R hip  R foot on furniture  slider  Circles clockwise and counterclockwise 10x3 each   Forward back 10x3  Standing R hip adduction with B UE assist, resisting yellow band gently 10x3. 2-3/10 discomfort.   Supine SLR R hip flexion 10x2, (2-3/10 discomfort)   Bridge with mini march 10x2 each LE    Forward step up onto 4 inch step with R LE 10x2   Then onto 6 inch step 10x (easy)   Then onto 8 inch step 10x . No pain    Min cue for femoral control      Improved exercise technique, movement at target joints, use of target muscles aftermin tomod verbal, visual, tactile cues.  Response to treatment Pt tolerated session well without aggravation of symptoms.    Clinical presentation Improved ability to engage hip flexors, adductor muscles with minimal to no discomfort. Improving load tolerance of affected muscles observed. Continued applying proper stress to affected tissues to promote proper healing. Pt will benefit from continued skilled physical therapy services to decrease pain, improve strength and function.      PT Short Term Goals - 05/01/19 1149      PT SHORT TERM GOAL #1   Title  Patient will be independent with her HEP to decrease pain, improve strength and function.    Baseline  Pt has started her HEP (03/19/2019); Pt demonstrates independence with her HEP (05/01/2019)    Time  3    Period  Weeks    Status  Achieved    Target Date  04/10/19        PT Long Term Goals - 05/26/19 0828      PT LONG TERM GOAL #1   Title  Pt will have a decrease in R hip pain to 2/10 or less at worst to promote ability to run, perform exercises, stand up from a half kneeling position more comfortably.    Baseline  6/10 R hip pain at most (03/19/2019); 2-3/10 at most for the past 7 days (05/01/2019); 2-3/10 at most for the past 7 days (05/12/19), (05/26/2019)    Time  6    Period  Weeks    Status  Partially Met    Target Date  07/10/19      PT LONG TERM GOAL #2   Title  Patient will improve R hip  flexion, extension, abduction, and adduction strength by at least 1/2 MMT grade to promote ability to perform functional tasks more comfortably.    Baseline  improved by 1/2 MMT grade but pain with hip adduction (05/26/2019)    Time  6  Period  Weeks    Status  Achieved    Target Date  07/10/19      PT LONG TERM GOAL #3   Title  Pt will be able to jog for at least 5 minutes without R hip pain to promote ability to participate in exercise and promote physical health    Baseline  increased R hip pain when jogging or running (03/19/2019); able to perform mini jog with short stride length (05/01/2019), (05/26/2019)    Time  6    Period  Weeks    Status  Partially Met    Target Date  07/10/19            Plan - 06/03/19 1708    Clinical Impression Statement  Improved ability to engage hip flexors, adductor muscles with minimal to no discomfort. Improving load tolerance of affected muscles observed. Continued applying proper stress to affected tissues to promote proper healing. Pt will benefit from continued skilled physical therapy services to decrease pain, improve strength and function.    Personal Factors and Comorbidities  Comorbidity 1    Comorbidities  hx of C-section    Examination-Activity Limitations  Squat    Stability/Clinical Decision Making  Stable/Uncomplicated    Rehab Potential  Good    PT Frequency  1x / week    PT Duration  6 weeks    PT Treatment/Interventions  Therapeutic activities;Therapeutic exercise;Neuromuscular re-education;Patient/family education;Manual techniques;Dry needling;Spinal Manipulations;Joint Manipulations;Aquatic Therapy;Electrical Stimulation;Iontophoresis 6m/ml Dexamethasone;Ultrasound    PT Next Visit Plan  isometric, then concentric, then eccentric hip and abdominal strengthening, manual techniques, modalities PRN    PT Home Exercise Plan  Medbridge Access Code: [email or web address]    Consulted and Agree with Plan of Care  Patient       Patient will  benefit from skilled therapeutic intervention in order to improve the following deficits and impairments:  Pain, Improper body mechanics, Postural dysfunction, Decreased strength  Visit Diagnosis: Muscle weakness (generalized)  Pain in right hip     Problem List Patient Active Problem List   Diagnosis Date Noted  . S/P cesarean section - triplets 11/29 02/17/2012  . Postpartum care following cesarean delivery (11/29) 02/17/2012  . Rh negative, maternal 02/17/2012  . 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 Routine general medical examination at a health care facility 06/23/2010  . FIBROCYSTIC BREAST DISEASE 07/17/2008  . Asthma, 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    MJoneen BoersPT, DPT    06/03/2019, 6:45 PM  [facility] [address] [place] [address]Phone: [phone]  Fax:  [phone] Name: NERCIE ELIASENMRN: [id number] of Birth: 9May 21, 1982

## 2019-06-07 ENCOUNTER — Ambulatory Visit: Payer: BC Managed Care – PPO | Attending: Internal Medicine

## 2019-06-07 DIAGNOSIS — Z23 Encounter for immunization: Secondary | ICD-10-CM

## 2019-06-07 NOTE — Progress Notes (Signed)
   Covid-19 Vaccination Clinic  Name:  Nicole Owens    MRN: [id number] DOB: 03/25/1980  06/07/2019  [name] was observed post Covid-19 immunization for 15 minutes without incident. She was provided with Vaccine Information Sheet and instruction to access the V-Safe system.   [name] was instructed to call 911 with any severe reactions post vaccine: Marland Kitchen Difficulty breathing  . Swelling of face and throat  . A fast heartbeat  . A bad rash all over body  . Dizziness and weakness   Immunizations Administered    Name Date Dose VIS Date Route   Pfizer COVID-19 Vaccine 06/07/2019  9:56 AM 0.3 mL 02/28/2019 Intramuscular   Manufacturer: [employer], [employer]   Lot: [id number]   NDC: [address]-2

## 2019-06-12 ENCOUNTER — Ambulatory Visit: Payer: BC Managed Care – PPO

## 2019-06-12 ENCOUNTER — Other Ambulatory Visit: Payer: Self-pay

## 2019-06-12 DIAGNOSIS — M25551 Pain in right hip: Secondary | ICD-10-CM

## 2019-06-12 DIAGNOSIS — M6281 Muscle weakness (generalized): Secondary | ICD-10-CM | POA: Diagnosis not present

## 2019-06-12 NOTE — Therapy (Signed)
[facility] [address], [place], [address] Phone: [phone]   Fax:  [phone]  Physical Therapy Treatment  Patient Details  Name: Nicole Owens MRN: [id number] Date of Birth: [birth year] Referring Provider (PT): Lynne Leader, MD   Encounter Date: 06/12/2019  PT End of Session - 06/12/19 1735    Visit Number  17    Number of Visits  23    Date for PT Re-Evaluation  07/10/19    PT Start Time  1245    PT Stop Time  1808    PT Time Calculation (min)  33 min    Activity Tolerance  Patient tolerated treatment well    Behavior During Therapy  [facility] for tasks assessed/performed       Past Medical History:  Diagnosis Date  . Asthma   . Asthma, intermittent    mild  . Carpal tunnel syndrome on both sides    with pregnancy  . Female infertility 04/30/2010   history restored after error with record merge  . Heartburn in pregnancy    nausea  . 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 Postpartum care following cesarean delivery 02/17/2012  . Rh negative, maternal 02/17/2012  . S/P cesarean section - triplets 11/29 02/17/2012    Past Surgical History:  Procedure Laterality Date  . CESAREAN SECTION  02/16/2012   Procedure: CESAREAN SECTION;  Surgeon: Lovenia Kim, MD;  Location: [facility];  Service: Obstetrics;  Laterality: N/A;  . NO PAST SURGERIES    . WISDOM TOOTH EXTRACTION      There were no vitals filed for this visit.  Subjective Assessment - 06/12/19 1736    Subjective  R hip is good. No Pain. was good since last session. Just hurts when she works out. Everyday stuff does not really hurt anymore.  Better able to jog. Modifying her gym workouts less.    Pertinent History  R hip pain. Pain began mid November 2020. Pt was at the gym. Pt was performing scissor kicks. Felt a little pain more than normal. Went for a backward lunge with R LE and felt sharp pain R lower abdomen. Took 10 days off and  iced that area. Did not really get better. Was diagnosed with sports hernia. Still getting pain but getting a little better. Could not jog before 6 weeks ago. Thinks she can jog slowly now. Using her R LE to stand up from a R half kneeling position bothers her. Pt goes to the gym 6 days a week before, no goes to the gym 1 time a week and performs modified upper body work out. Pivoting the wrong way sometimes would hurt. Going up the steps 2 at a time bothers her R hip. Getting out of bed involving SLR hip flexion movement would bother it. Coughing, sneezing would bother R hip. Had C section 7 years ago for triplets. The pain is along the line of the incision.    Patient Stated Goals  Anything in the lunge position, be able to run (pt runs half marathons)    Currently in Pain?  No/denies    Pain Score  0-No pain                               PT Education - 06/12/19 1803    Education Details  ther-ex    Person(s) Educated  Patient    Methods  Explanation;Demonstration;Tactile cues;Verbal cues    Comprehension  Returned demonstration;Verbalized understanding      Objective   No latex allergies    MedbridgeAccess Code: [email or web address]   Pt wants to work up to being able to do planks, pushups, sit ups, spider and mountain climbers, jumps  Therapeutic exercise   fast jog 500 ft to the R, then 500 ft to the L,     No pain with faster pace  Standing B gastroc stretch at stair step 30 seconds x 3   Standing mini static hops 10x2.   Hopping over line and back 10x3 front and back. No pain.   Side to side hopping over line 10x3. No R hip pain.   Total gym, height: 2  Kneeling rows 10x3 to promote core and hip flexor muscle use   Standing R hip adduction with B UE assist, resisting yellow band gently 10x3. No discomfort.   Supine SLR R hip flexion 10x3 (no discomfort. Just difficult to perform)  Bridge with mini march 10x2 each LE     Improved  exercise technique, movement at target joints, use of target muscles aftermin tomod verbal, visual, tactile cues.    Response to treatment Pt tolerated session well without aggravation of symptoms.    Clinical presentation Session ended early secondary to pt receiving a family emergency phone call.   Improving ability to tolerate load onto R hip flexor and hip adductor muscles with minimal to no reports of discomfort. Pt now able to jog at a faster pace and perform hops (forward and side) without pain. Continued working on R hip strength and proper load placement to affected muscles for proper healing. Pt making very good progress with decreased pain and improved function. Pt will benefit from continued skilled physical therapy services to decrease pain, improve strength and function.     PT Short Term Goals - 05/01/19 1149      PT SHORT TERM GOAL #1   Title  Patient will be independent with her HEP to decrease pain, improve strength and function.    Baseline  Pt has started her HEP (03/19/2019); Pt demonstrates independence with her HEP (05/01/2019)    Time  3    Period  Weeks    Status  Achieved    Target Date  04/10/19        PT Long Term Goals - 05/26/19 0828      PT LONG TERM GOAL #1   Title  Pt will have a decrease in R hip pain to 2/10 or less at worst to promote ability to run, perform exercises, stand up from a half kneeling position more comfortably.    Baseline  6/10 R hip pain at most (03/19/2019); 2-3/10 at most for the past 7 days (05/01/2019); 2-3/10 at most for the past 7 days (05/12/19), (05/26/2019)    Time  6    Period  Weeks    Status  Partially Met    Target Date  07/10/19      PT LONG TERM GOAL #2   Title  Patient will improve R hip flexion, extension, abduction, and adduction strength by at least 1/2 MMT grade to promote ability to perform functional tasks more comfortably.    Baseline  improved by 1/2 MMT grade but pain with hip adduction  (05/26/2019)    Time  6    Period  Weeks    Status  Achieved    Target Date  07/10/19      PT LONG  TERM GOAL #3   Title  Pt will be able to jog for at least 5 minutes without R hip pain to promote ability to participate in exercise and promote physical health    Baseline  increased R hip pain when jogging or running (03/19/2019); able to perform mini jog with short stride length (05/01/2019), (05/26/2019)    Time  6    Period  Weeks    Status  Partially Met    Target Date  07/10/19            Plan - 06/12/19 1803    Clinical Impression Statement  Improving ability to tolerate load onto R hip flexor and hip adductor muscles with minimal to no reports of discomfort. Pt now able to jog at a faster pace and perform hops (forward and side) without pain. Continued working on R hip strength and proper load placement to affected muscles for proper healing. Pt making very good progress with decreased pain and improved function. Pt will benefit from continued skilled physical therapy services to decrease pain, improve strength and function.    Personal Factors and Comorbidities  Comorbidity 1    Comorbidities  hx of C-section    Examination-Activity Limitations  Squat    Stability/Clinical Decision Making  Stable/Uncomplicated    Rehab Potential  Good    PT Frequency  1x / week    PT Duration  6 weeks    PT Treatment/Interventions  Therapeutic activities;Therapeutic exercise;Neuromuscular re-education;Patient/family education;Manual techniques;Dry needling;Spinal Manipulations;Joint Manipulations;Aquatic Therapy;Electrical Stimulation;Iontophoresis 68m/ml Dexamethasone;Ultrasound    PT Next Visit Plan  isometric, then concentric, then eccentric hip and abdominal strengthening, manual techniques, modalities PRN    PT Home Exercise Plan  Medbridge Access Code: [email or web address]    Consulted and Agree with Plan of Care  Patient       Patient will benefit from skilled therapeutic intervention in order to  improve the following deficits and impairments:  Pain, Improper body mechanics, Postural dysfunction, Decreased strength  Visit Diagnosis: Pain in right hip  Muscle weakness (generalized)     Problem List Patient Active Problem List   Diagnosis Date Noted  . S/P cesarean section - triplets 11/29 02/17/2012  . Postpartum care following cesarean delivery (11/29) 02/17/2012  . Rh negative, maternal 02/17/2012  . 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 Routine general medical examination at a health care facility 06/23/2010  . FIBROCYSTIC BREAST DISEASE 07/17/2008  . Asthma, 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    MJoneen BoersPT, DPT   06/12/2019, 6:22 PM  [facility] [address] [place] [address]Phone: [phone]  Fax:  [phone] Name: NSOLYMAR GRACEMRN: [id number] of Birth: [birth year]

## 2019-06-17 ENCOUNTER — Ambulatory Visit: Payer: BC Managed Care – PPO

## 2019-06-17 ENCOUNTER — Other Ambulatory Visit: Payer: Self-pay

## 2019-06-17 DIAGNOSIS — M6281 Muscle weakness (generalized): Secondary | ICD-10-CM

## 2019-06-17 DIAGNOSIS — M25551 Pain in right hip: Secondary | ICD-10-CM | POA: Diagnosis not present

## 2019-06-17 NOTE — Therapy (Signed)
[facility] [address] [place], [address] Phone: [phone]   Fax:  [phone]  Physical Therapy Treatment  Patient Details  Name: Nicole Owens MRN: [id number] Date of Birth: 03-Oct-1980 Referring Provider (PT): Lynne Leader, MD   Encounter Date: 06/17/2019  PT End of Session - 06/17/19 0805    Visit Number  18    Number of Visits  23    Date for PT Re-Evaluation  07/10/19    PT Start Time  0805    PT Stop Time  0849    PT Time Calculation (min)  44 min    Activity Tolerance  Patient tolerated treatment well    Behavior During Therapy  [facility] for tasks assessed/performed       Past Medical History:  Diagnosis Date  . Asthma   . Asthma, intermittent    mild  . Carpal tunnel syndrome on both sides    with pregnancy  . Female infertility 04/30/2010   history restored after error with record merge  . Heartburn in pregnancy    nausea  . 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 Postpartum care following cesarean delivery 02/17/2012  . Rh negative, maternal 02/17/2012  . S/P cesarean section - triplets 11/29 02/17/2012    Past Surgical History:  Procedure Laterality Date  . CESAREAN SECTION  02/16/2012   Procedure: CESAREAN SECTION;  Surgeon: Lovenia Kim, MD;  Location: [facility];  Service: Obstetrics;  Laterality: N/A;  . NO PAST SURGERIES    . WISDOM TOOTH EXTRACTION      There were no vitals filed for this visit.  Subjective Assessment - 06/17/19 0807    Subjective  Feels tired. R hip was sore since the last session. No pain currently. Just 1-2/10 soreness R hip. Child is fine now. The food that got stuck in his throat got dislodged.    Pertinent History  R hip pain. Pain began mid November 2020. Pt was at the gym. Pt was performing scissor kicks. Felt a little pain more than normal. Went for a backward lunge with R LE and felt sharp pain R lower abdomen. Took 10 days off and iced that  area. Did not really get better. Was diagnosed with sports hernia. Still getting pain but getting a little better. Could not jog before 6 weeks ago. Thinks she can jog slowly now. Using her R LE to stand up from a R half kneeling position bothers her. Pt goes to the gym 6 days a week before, no goes to the gym 1 time a week and performs modified upper body work out. Pivoting the wrong way sometimes would hurt. Going up the steps 2 at a time bothers her R hip. Getting out of bed involving SLR hip flexion movement would bother it. Coughing, sneezing would bother R hip. Had C section 7 years ago for triplets. The pain is along the line of the incision.    Patient Stated Goals  Anything in the lunge position, be able to run (pt runs half marathons)    Currently in Pain?  Yes    Pain Score  2     Pain Location  Hip    Pain Orientation  Right    Pain Descriptors / Indicators  Sore                               PT Education -  06/17/19 4818    Education Details  ther-ex    Person(s) Educated  Patient    Methods  Explanation;Demonstration;Tactile cues;Verbal cues    Comprehension  Returned demonstration;Verbalized understanding         Objective   No latex allergies    MedbridgeAccess Code: [email or web address]   Pt wants to work up to being able to do planks, pushups, sit ups, spider and mountain climbers, jumps  Therapeutic exercise   Treadmill jog speed 3.6 x 1 minute  Then speed 4.0 x 3 minutes   Then speed 3.6 x 2 minutes  Then speed 4.0 x 3 minutes  Then speed 3.6 x 1 minute  Then walk at speed 2.6 x 2 minutes   No pain or discomfort  Standing B gastroc stretch at stair step 1 minute x 2   Standing R hip adduction with B UE assist, resisting yellow band gently 10x3. 1-2/10 discomfort  standing hip machine height 1   Hip extension   R plate 40 for 56D1   L plate 40 for 49F0   Hip abduction   R plate 40 for 26V. Difficult, no pain.    L plate 40  for 78H8    Good closed chain muscle use felt for R hip adductors and flexors. No pain.    Box/step jumps   4 inch step 10x2   Side to side hopping over line 10x3. No R hip pain.   Hopping over line and back 10x3 front and back. No pain.     Improved exercise technique, movement at target joints, use of target muscles aftermin tomod verbal, visual, tactile cues.    Response to treatment Pt tolerated session well without aggravation of symptoms.    Clinical presentation Continued applying proper stress to affected hip muscles to promote proper healing. Improving ability for muscles to tolerate loads. Pt able to jog at longer periods as well as hop without aggravation of symptoms. Pt will benefit from continued skilled physical therapy services to decrease pain, improve strength, function and return to her normal activity levels.        PT Short Term Goals - 05/01/19 1149      PT SHORT TERM GOAL #1   Title  Patient will be independent with her HEP to decrease pain, improve strength and function.    Baseline  Pt has started her HEP (03/19/2019); Pt demonstrates independence with her HEP (05/01/2019)    Time  3    Period  Weeks    Status  Achieved    Target Date  04/10/19        PT Long Term Goals - 05/26/19 0828      PT LONG TERM GOAL #1   Title  Pt will have a decrease in R hip pain to 2/10 or less at worst to promote ability to run, perform exercises, stand up from a half kneeling position more comfortably.    Baseline  6/10 R hip pain at most (03/19/2019); 2-3/10 at most for the past 7 days (05/01/2019); 2-3/10 at most for the past 7 days (05/12/19), (05/26/2019)    Time  6    Period  Weeks    Status  Partially Met    Target Date  07/10/19      PT LONG TERM GOAL #2   Title  Patient will improve R hip flexion, extension, abduction, and adduction strength by at least 1/2 MMT grade to promote ability to perform functional tasks more comfortably.  Baseline  improved by 1/2 MMT grade but pain with hip adduction (05/26/2019)    Time  6    Period  Weeks    Status  Achieved    Target Date  07/10/19      PT LONG TERM GOAL #3   Title  Pt will be able to jog for at least 5 minutes without R hip pain to promote ability to participate in exercise and promote physical health    Baseline  increased R hip pain when jogging or running (03/19/2019); able to perform mini jog with short stride length (05/01/2019), (05/26/2019)    Time  6    Period  Weeks    Status  Partially Met    Target Date  07/10/19            Plan - 06/17/19 0815    Clinical Impression Statement  Continued applying proper stress to affected hip muscles to promote proper healing. Improving ability for muscles to tolerate loads. Pt able to jog at longer periods as well as hop without aggravation of symptoms. Pt will benefit from continued skilled physical therapy services to decrease pain, improve strength, function and return to her normal activity levels.    Personal Factors and Comorbidities  Comorbidity 1    Comorbidities  hx of C-section    Examination-Activity Limitations  Squat    Stability/Clinical Decision Making  Stable/Uncomplicated    Rehab Potential  Good    PT Frequency  1x / week    PT Duration  6 weeks    PT Treatment/Interventions  Therapeutic activities;Therapeutic exercise;Neuromuscular re-education;Patient/family education;Manual techniques;Dry needling;Spinal Manipulations;Joint Manipulations;Aquatic Therapy;Electrical Stimulation;Iontophoresis 34m/ml Dexamethasone;Ultrasound    PT Next Visit Plan  isometric, then concentric, then eccentric hip and abdominal strengthening, manual techniques, modalities PRN    PT Home Exercise Plan  Medbridge Access Code: [email or web address]    Consulted and Agree with Plan of Care  Patient       Patient will benefit from skilled therapeutic intervention in order to improve the following deficits and impairments:  Pain, Improper  body mechanics, Postural dysfunction, Decreased strength  Visit Diagnosis: Pain in right hip  Muscle weakness (generalized)     Problem List Patient Active Problem List   Diagnosis Date Noted  . S/P cesarean section - triplets 11/29 02/17/2012  . Postpartum care following cesarean delivery (11/29) 02/17/2012  . Rh negative, maternal 02/17/2012  . 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 Routine general medical examination at a health care facility 06/23/2010  . FIBROCYSTIC BREAST DISEASE 07/17/2008  . Asthma, 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    MJoneen BoersPT, DPT   06/17/2019, 9:07 AM  [facility] [address] [place] [address]Phone: [phone]  Fax:  [phone] Name: Nicole SCHUKNECHTMRN: [id number] of Birth: [birth year]

## 2019-06-24 ENCOUNTER — Other Ambulatory Visit: Payer: Self-pay

## 2019-06-24 ENCOUNTER — Ambulatory Visit: Payer: BC Managed Care – PPO | Attending: Family Medicine

## 2019-06-24 DIAGNOSIS — M25551 Pain in right hip: Secondary | ICD-10-CM | POA: Insufficient documentation

## 2019-06-24 DIAGNOSIS — M6281 Muscle weakness (generalized): Secondary | ICD-10-CM

## 2019-06-24 NOTE — Therapy (Signed)
[facility] [address] [place], [address] Phone: [phone]   Fax:  [phone]  Physical Therapy Treatment  Patient Details  Name: Nicole Owens MRN: [id number] Date of Birth: [birth year] Referring Provider (PT): Lynne Leader, MD   Encounter Date: 06/24/2019  PT End of Session - 06/24/19 0904    Visit Number  19    Number of Visits  23    Date for PT Re-Evaluation  07/10/19    PT Start Time  0805    PT Stop Time  0850    PT Time Calculation (min)  45 min    Activity Tolerance  Patient tolerated treatment well    Behavior During Therapy  [facility] for tasks assessed/performed       Past Medical History:  Diagnosis Date  . Asthma   . Asthma, intermittent    mild  . Carpal tunnel syndrome on both sides    with pregnancy  . Female infertility 04/30/2010   history restored after error with record merge  . Heartburn in pregnancy    nausea  . 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 Postpartum care following cesarean delivery 02/17/2012  . Rh negative, maternal 02/17/2012  . S/P cesarean section - triplets 11/29 02/17/2012    Past Surgical History:  Procedure Laterality Date  . CESAREAN SECTION  02/16/2012   Procedure: CESAREAN SECTION;  Surgeon: Lovenia Kim, MD;  Location: [facility];  Service: Obstetrics;  Laterality: N/A;  . NO PAST SURGERIES    . WISDOM TOOTH EXTRACTION      There were no vitals filed for this visit.  Subjective Assessment - 06/24/19 0806    Subjective  Pt reported with any elevated leg abdominal exercises, pt stated that her pain was a 7-8/10. Reported she was sore yesterday, reported no pain today, stated that she has not worked out yet today.    Pertinent History  R hip pain. Pain began mid November 2020. Pt was at the gym. Pt was performing scissor kicks. Felt a little pain more than normal. Went for a backward lunge with R LE and felt sharp pain R lower abdomen. Took 10  days off and iced that area. Did not really get better. Was diagnosed with sports hernia. Still getting pain but getting a little better. Could not jog before 6 weeks ago. Thinks she can jog slowly now. Using her R LE to stand up from a R half kneeling position bothers her. Pt goes to the gym 6 days a week before, no goes to the gym 1 time a week and performs modified upper body work out. Pivoting the wrong way sometimes would hurt. Going up the steps 2 at a time bothers her R hip. Getting out of bed involving SLR hip flexion movement would bother it. Coughing, sneezing would bother R hip. Had C section 7 years ago for triplets. The pain is along the line of the incision.    Currently in Pain?  No/denies        Medbridge Access Code: [email or web address]   Pt wants to work up to being able to do planks, pushups, sit ups, spider and mountain climbers, jumps    Objective:  Pt very TTP of R hip rectus femoris, Sartorious (pt reported concordant pain with sartorius MMT testing as well as pure hip flexion in supine)  Therapeutic exercise    Treadmill jog speed 3.0 MPH x2 mins   Speed 5.0 MPH x45 seconds  spped 3.7 x 2 mins (fast walk)         frog stretch (modified to focus on R hip/adductors cues to avoid painful range) x45 seconds Pigeon stretch (modified to focus on R hip flexor stretch (R leg extended behind patient) x45 seconds Isometric flexion (hip) isometric at 30% contraction Flexion adduction isometric at 30% contraction Pure adduction isometric squeezing towel between knees max contraction Posterior pelvic tilts x10, with verbal cues  Treadmill jog:  5.0 MPH at 45 seconds, pt reported change in sensation, more "engaged" more like a "pin" compared to previous jogging. Pt cued to focus on pulling knees forward during running, pt reported change in sensation as well, difficulty describing.  Standing leg swings (diagonal, R leg flexion/adduction, extension/abduction) x10 Standing leg swings pure  flexion/extension x10    standing hip machine height 1: cued for posture, pelvic alignment             Hip extension                         R plate 40 for 16X0                         L plate 40 for 96E4               Hip abduction                         R plate 40 for 54U.                         L plate 40 for 98J  Hip flexion   R plate 25# X91; pt reported increased effort on R side, but not painful   L plate 25# Y78                                        Improved exercise technique, movement at target joints, use of target muscles after min to mod verbal, visual, tactile cues.    Pt response/clinical impression: The patient reported aggravation of symptoms with palpation, and isometric contraction this session, resolved with end of activity. Pt instructed to attempt hip stretches/leg swings prior to next jog and to assess response. Informed to stop these attempts if pt pain increases. The patient may benefit from hip flexor strengthening/stretching/manual therapy to address weakness/tightness present.        PT Education - 06/24/19 0854    Education Details  ther-ex    Person(s) Educated  Patient    Methods  Explanation;Demonstration;Tactile cues;Verbal cues    Comprehension  Verbalized understanding;Tactile cues required;Returned demonstration;Verbal cues required;Need further instruction       PT Short Term Goals - 05/01/19 1149      PT SHORT TERM GOAL #1   Title  Patient will be independent with her HEP to decrease pain, improve strength and function.    Baseline  Pt has started her HEP (03/19/2019); Pt demonstrates independence with her HEP (05/01/2019)    Time  3    Period  Weeks    Status  Achieved    Target Date  04/10/19        PT Long Term Goals - 05/26/19 0828      PT LONG TERM GOAL #1   Title  Pt  will have a decrease in R hip pain to 2/10 or less at worst to promote ability to run, perform exercises, stand up from a half kneeling position more  comfortably.    Baseline  6/10 R hip pain at most (03/19/2019); 2-3/10 at most for the past 7 days (05/01/2019); 2-3/10 at most for the past 7 days (05/12/19), (05/26/2019)    Time  6    Period  Weeks    Status  Partially Met    Target Date  07/10/19      PT LONG TERM GOAL #2   Title  Patient will improve R hip flexion, extension, abduction, and adduction strength by at least 1/2 MMT grade to promote ability to perform functional tasks more comfortably.    Baseline  improved by 1/2 MMT grade but pain with hip adduction (05/26/2019)    Time  6    Period  Weeks    Status  Achieved    Target Date  07/10/19      PT LONG TERM GOAL #3   Title  Pt will be able to jog for at least 5 minutes without R hip pain to promote ability to participate in exercise and promote physical health    Baseline  increased R hip pain when jogging or running (03/19/2019); able to perform mini jog with short stride length (05/01/2019), (05/26/2019)    Time  6    Period  Weeks    Status  Partially Met    Target Date  07/10/19            Plan - 06/24/19 0855    Clinical Impression Statement  The patient reported aggravation of symptoms with palpation, and isometric contraction this session, resolved with end of activity. Pt instructed to attempt hip stretches/leg swings prior to next jog and to assess response. Informed to stop these attempts if pt pain increases. The patient may benefit from hip flexor strengthening/stretching/manual therapy to address weakness/tightness present.    Personal Factors and Comorbidities  Comorbidity 1    Comorbidities  hx of C-section    Examination-Activity Limitations  Squat    Stability/Clinical Decision Making  Stable/Uncomplicated    Rehab Potential  Good    PT Frequency  1x / week    PT Duration  6 weeks    PT Treatment/Interventions  Therapeutic activities;Therapeutic exercise;Neuromuscular re-education;Patient/family education;Manual techniques;Dry needling;Spinal  Manipulations;Joint Manipulations;Aquatic Therapy;Electrical Stimulation;Iontophoresis 33m/ml Dexamethasone;Ultrasound    PT Next Visit Plan  isometric, then concentric, then eccentric hip and abdominal strengthening, manual techniques, modalities PRN    PT Home Exercise Plan  Medbridge Access Code: [email or web address]    Consulted and Agree with Plan of Care  Patient       Patient will benefit from skilled therapeutic intervention in order to improve the following deficits and impairments:  Pain, Improper body mechanics, Postural dysfunction, Decreased strength  Visit Diagnosis: Pain in right hip  Muscle weakness (generalized)     Problem List Patient Active Problem List   Diagnosis Date Noted  . S/P cesarean section - triplets 11/29 02/17/2012  . Postpartum care following cesarean delivery (11/29) 02/17/2012  . Rh negative, maternal 02/17/2012  . 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 Routine general medical examination at a health care facility 06/23/2010  . FIBROCYSTIC BREAST DISEASE 07/17/2008  . Asthma, 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    DLieutenant DiegoPT, DPT 9:05 AM,06/24/19   [facility] [address] [place] [address]Phone: [phone]  Fax:  [phone]  Name: Nicole Owens MRN: [id number] Date of Birth: August 31, 1980

## 2019-06-28 ENCOUNTER — Ambulatory Visit: Payer: BC Managed Care – PPO | Attending: Internal Medicine

## 2019-06-28 DIAGNOSIS — Z23 Encounter for immunization: Secondary | ICD-10-CM

## 2019-06-28 NOTE — Progress Notes (Signed)
   Covid-19 Vaccination Clinic  Name:  Nicole Owens    MRN: [id number] DOB: June 29, 1980  06/28/2019  Nicole Owens was observed post Covid-19 immunization for 15 minutes without incident. She was provided with Vaccine Information Sheet and instruction to access the V-Safe system.   Nicole Owens was instructed to call 911 with any severe reactions post vaccine: Marland Kitchen Difficulty breathing  . Swelling of face and throat  . A fast heartbeat  . A bad rash all over body  . Dizziness and weakness   Immunizations Administered    Name Date Dose VIS Date Route   Pfizer COVID-19 Vaccine 06/28/2019  9:48 AM 0.3 mL 02/28/2019 Intramuscular   Manufacturer: [employer], [employer]   Lot: [id number]   NDC: [address]-7

## 2019-07-01 ENCOUNTER — Ambulatory Visit: Payer: BC Managed Care – PPO

## 2019-07-01 ENCOUNTER — Other Ambulatory Visit: Payer: Self-pay

## 2019-07-01 DIAGNOSIS — M6281 Muscle weakness (generalized): Secondary | ICD-10-CM | POA: Diagnosis not present

## 2019-07-01 DIAGNOSIS — M25551 Pain in right hip: Secondary | ICD-10-CM | POA: Diagnosis not present

## 2019-07-01 NOTE — Therapy (Signed)
[facility] [facility] [address] [place], [address] Phone: [phone]   Fax:  [phone]  Physical Therapy Treatment  Patient Details  Name: Nicole Owens MRN: [id number] Date of Birth: 1980/04/17 Referring Provider (PT): Lynne Leader, MD   Encounter Date: 07/01/2019  PT End of Session - 07/01/19 0805    Visit Number  20    Number of Visits  23    Date for PT Re-Evaluation  07/10/19    PT Start Time  0805    PT Stop Time  0844    PT Time Calculation (min)  39 min    Activity Tolerance  Patient tolerated treatment well    Behavior During Therapy  [facility] for tasks assessed/performed       Past Medical History:  Diagnosis Date  . Asthma   . Asthma, intermittent    mild  . Carpal tunnel syndrome on both sides    with pregnancy  . Female infertility 04/30/2010   history restored after error with record merge  . Heartburn in pregnancy    nausea  . 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 Postpartum care following cesarean delivery 02/17/2012  . Rh negative, maternal 02/17/2012  . S/P cesarean section - triplets 11/29 02/17/2012    Past Surgical History:  Procedure Laterality Date  . CESAREAN SECTION  02/16/2012   Procedure: CESAREAN SECTION;  Surgeon: Lovenia Kim, MD;  Location: [facility];  Service: Obstetrics;  Laterality: N/A;  . NO PAST SURGERIES    . WISDOM TOOTH EXTRACTION      There were no vitals filed for this visit.  Subjective Assessment - 07/01/19 0806    Subjective  Was able to do modified scissor kicks this morning and was fine. R hip feels ok. Does not hurt. Just the particular things (lower abdominal) bothers her.  Did the stretches from last session at home.    Pertinent History  R hip pain. Pain began mid November 2020. Pt was at the gym. Pt was performing scissor kicks. Felt a little pain more than normal. Went for a backward lunge with R LE and felt sharp pain R lower abdomen. Took  10 days off and iced that area. Did not really get better. Was diagnosed with sports hernia. Still getting pain but getting a little better. Could not jog before 6 weeks ago. Thinks she can jog slowly now. Using her R LE to stand up from a R half kneeling position bothers her. Pt goes to the gym 6 days a week before, no goes to the gym 1 time a week and performs modified upper body work out. Pivoting the wrong way sometimes would hurt. Going up the steps 2 at a time bothers her R hip. Getting out of bed involving SLR hip flexion movement would bother it. Coughing, sneezing would bother R hip. Had C section 7 years ago for triplets. The pain is along the line of the incision.    Currently in Pain?  No/denies    Pain Score  0-No pain                               PT Education - 07/01/19 0842    Education Details  ther-ex, HEP    Person(s) Educated  Patient    Methods  Explanation;Demonstration;Tactile cues;Handout;Verbal cues    Comprehension  Verbalized understanding;Returned demonstration      Objective  No latex allergies    MedbridgeAccess Code: [email or web address]   Pt wants to work up to being able to do planks, pushups, sit ups, spider and mountain climbers, jumps  Therapeutic exercise   [place] 32 ft to the R and L 4x  Hopping over line and back 30 seconds Side to side hopping over line 30 seconds  Standing B gastroc stretch at stair step 1 minute x 2  Seated R hip flexion with 2 lbs at ankle 10x2  Then eccentric 10x   Seated R LAQ 2 lbs eccentric (for rectus femoris) 10x2  Sitting with upright posture  Gentle manual trunk flexion isometrics in neutral, pt holding PVC pipe, resistance provided by PT  1 minute x 3 targeting the R lower abdominal muscles   Supine assisted R LE SLR hip flexion with strap 10x2  Reviewed and given as part of HEP  R S/L R hip adduction pain free range (about 2 cm) 10x  Then AAROM with PT assist 10x2  hooklying  alternating leg extension 10x3 each LE  hooklying hip adductor ball squeeze 30 seconds x3     Improved exercise technique, movement at target joints, use of target muscles aftermin tomod verbal, visual, tactile cues.    Response to treatment Pt tolerated session well without aggravation of symptoms.    Clinical presentation Continued applying proper stress to affected areas to promote healing. Able to engage and contracte muscles around R hip wihtout pain with gentle loads. Pt will benefit from continued skilled physical therapy services to decrease pain. Improve strength and function.     PT Short Term Goals - 05/01/19 1149      PT SHORT TERM GOAL #1   Title  Patient will be independent with her HEP to decrease pain, improve strength and function.    Baseline  Pt has started her HEP (03/19/2019); Pt demonstrates independence with her HEP (05/01/2019)    Time  3    Period  Weeks    Status  Achieved    Target Date  04/10/19        PT Long Term Goals - 05/26/19 0828      PT LONG TERM GOAL #1   Title  Pt will have a decrease in R hip pain to 2/10 or less at worst to promote ability to run, perform exercises, stand up from a half kneeling position more comfortably.    Baseline  6/10 R hip pain at most (03/19/2019); 2-3/10 at most for the past 7 days (05/01/2019); 2-3/10 at most for the past 7 days (05/12/19), (05/26/2019)    Time  6    Period  Weeks    Status  Partially Met    Target Date  07/10/19      PT LONG TERM GOAL #2   Title  Patient will improve R hip flexion, extension, abduction, and adduction strength by at least 1/2 MMT grade to promote ability to perform functional tasks more comfortably.    Baseline  improved by 1/2 MMT grade but pain with hip adduction (05/26/2019)    Time  6    Period  Weeks    Status  Achieved    Target Date  07/10/19      PT LONG TERM GOAL #3   Title  Pt will be able to jog for at least 5 minutes without R hip pain to  promote ability to participate in exercise and promote physical health    Baseline  increased R hip pain when jogging  or running (03/19/2019); able to perform mini jog with short stride length (05/01/2019), (05/26/2019)    Time  6    Period  Weeks    Status  Partially Met    Target Date  07/10/19            Plan - 07/01/19 0843    Clinical Impression Statement  Continued applying proper stress to affected areas to promote healing. Able to engage and contracte muscles around R hip wihtout pain with gentle loads. Pt will benefit from continued skilled physical therapy services to decrease pain. Improve strength and function.    Personal Factors and Comorbidities  Comorbidity 1    Comorbidities  hx of C-section    Examination-Activity Limitations  Squat    Stability/Clinical Decision Making  Stable/Uncomplicated    Rehab Potential  Good    PT Frequency  1x / week    PT Duration  6 weeks    PT Treatment/Interventions  Therapeutic activities;Therapeutic exercise;Neuromuscular re-education;Patient/family education;Manual techniques;Dry needling;Spinal Manipulations;Joint Manipulations;Aquatic Therapy;Electrical Stimulation;Iontophoresis 87m/ml Dexamethasone;Ultrasound    PT Next Visit Plan  isometric, then concentric, then eccentric hip and abdominal strengthening, manual techniques, modalities PRN    PT Home Exercise Plan  Medbridge Access Code: [email or web address]    Consulted and Agree with Plan of Care  Patient       Patient will benefit from skilled therapeutic intervention in order to improve the following deficits and impairments:  Pain, Improper body mechanics, Postural dysfunction, Decreased strength  Visit Diagnosis: Pain in right hip  Muscle weakness (generalized)     Problem List Patient Active Problem List   Diagnosis Date Noted  . S/P cesarean section - triplets 11/29 02/17/2012  . Postpartum care following cesarean delivery (11/29) 02/17/2012  . Rh negative, maternal  02/17/2012  . 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 Routine general medical examination at a health care facility 06/23/2010  . FIBROCYSTIC BREAST DISEASE 07/17/2008  . Asthma, 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    MJoneen BoersPT, DPT   07/01/2019, 6:58 PM  [facility] [address] [place] [address]Phone: [phone]  Fax:  [phone] Name: NANYELINA CLAYCOMBMRN: [id number] of Birth: 9December 18, 1982

## 2019-07-01 NOTE — Patient Instructions (Signed)
Access Code: [email or web address] URL: [email or web address] Date: 07/01/2019 Prepared by: Loralyn Freshwater  Exercises Seated Hip Flexion - 1 x daily - 7 x weekly - 10 reps - 3 sets Supine Bridge with Mini Swiss Ball Between Knees - 1 x daily - 7 x weekly - 10 reps - 3 sets - 5 seconds hold Bent Knee Fallouts - 1 x daily - 7 x weekly - 10 reps - 3 sets Single Leg Stance with Support - 3 x daily - 7 x weekly - 10 reps - 3 sets - 5 seconds hold Shoulder extension with resistance - Neutral - 1 x daily - 7 x weekly - 10 reps - 3 sets - 10 hold Straight Leg Raise - 1 x daily - 7 x weekly - 2 sets - 10 reps

## 2019-07-08 ENCOUNTER — Other Ambulatory Visit: Payer: Self-pay

## 2019-07-08 ENCOUNTER — Ambulatory Visit: Payer: BC Managed Care – PPO

## 2019-07-08 DIAGNOSIS — M25551 Pain in right hip: Secondary | ICD-10-CM

## 2019-07-08 DIAGNOSIS — M6281 Muscle weakness (generalized): Secondary | ICD-10-CM | POA: Diagnosis not present

## 2019-07-08 NOTE — Therapy (Signed)
[facility] [address] [place], [address] Phone: [phone]   Fax:  [phone]  Physical Therapy Treatment  Patient Details  Name: Nicole Owens MRN: [id number] Date of Birth: 01-Mar-1981 Referring Provider (PT): Lynne Leader, MD   Encounter Date: 07/08/2019  PT End of Session - 07/08/19 0804    Visit Number  21    Number of Visits  31    Date for PT Re-Evaluation  09/04/19    PT Start Time  0804    PT Stop Time  0906    PT Time Calculation (min)  62 min    Activity Tolerance  Patient tolerated treatment well    Behavior During Therapy  [facility] for tasks assessed/performed       Past Medical History:  Diagnosis Date  . Asthma   . Asthma, intermittent    mild  . Carpal tunnel syndrome on both sides    with pregnancy  . Female infertility 04/30/2010   history restored after error with record merge  . Heartburn in pregnancy    nausea  . 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 Postpartum care following cesarean delivery 02/17/2012  . Rh negative, maternal 02/17/2012  . S/P cesarean section - triplets 11/29 02/17/2012    Past Surgical History:  Procedure Laterality Date  . CESAREAN SECTION  02/16/2012   Procedure: CESAREAN SECTION;  Surgeon: Lovenia Kim, MD;  Location: [facility];  Service: Obstetrics;  Laterality: N/A;  . NO PAST SURGERIES    . WISDOM TOOTH EXTRACTION      There were no vitals filed for this visit.  Subjective Assessment - 07/08/19 0805    Subjective  R hip does not feel great since Sunday. Just sore, does not know why. Yesterday's workout at the gym ([employer] workout) was harder. 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 Friday was a core day, made modifications proably. Soreness since Saturday is about the same. 3/10 level soreness. currently. 3/10 R hip pain at most for the past 7 days.   Feels like she is making progress with PT. Can do speed and agility workouts at the gym right now.    Pertinent History  R hip pain. Pain began mid November 2020. Pt was at the gym. Pt was performing scissor kicks. Felt a little pain more than normal. Went for a backward lunge with R LE and felt sharp pain R lower abdomen. Took 10 days off and iced that area. Did not really get better. Was diagnosed with sports hernia. Still getting pain but getting a little better. Could not jog before 6 weeks ago. Thinks she can jog slowly now. Using her R LE to stand up from a R half kneeling position bothers her. Pt goes to the gym 6 days a week before, no goes to the gym 1 time a week and performs modified upper body work out. Pivoting the wrong way sometimes would hurt. Going up the steps 2 at a time bothers her R hip. Getting out of bed involving SLR hip flexion movement would bother it. Coughing, sneezing would bother R hip. Had C section 7 years ago for triplets. The pain is along the line of the incision.    Currently in Pain?  Yes    Pain Score  3          [facility] PT Assessment -  07/08/19 7867      Strength   Right Hip Flexion  5/5   with pain   Right Hip Extension  4-/5    Right Hip ABduction  5/5    Right Hip ADduction  4/5   3/10 pain                          PT Education - 07/08/19 0913    Education Details  ther-ex, progress/current status with PT towards goals, plan of care    Person(s) Educated  Patient    Methods  Explanation;Demonstration;Tactile cues;Verbal cues    Comprehension  Returned demonstration;Verbalized understanding         Objective   No latex allergies    MedbridgeAccess Code: [email or web address]   Pt wants to work up to being able to do planks, pushups, sit ups, spider and mountain climbers, jumps  Therapeutic exercise       Seated manually resisted hip flexion, S/L hip abduction, adduction, proine hip extension 1-2x each  Supine L  hip IR stretch (limited compared to R) 1 min x 3  Supine with hip at 90/90, L 10x3   Decreased R hip pain to 1/10 afterwards   Supine R hip flexion SLR in slight hooklying positiong 1x. 5/10 level discomfort  Supine assisted SLR R hip flexion with PT assist 4x3  Supine with compression pressure to pelvis to promote stability. Better able to perform supine SLR hip flexion, no pain, just difficult.   Possible hiatial hernia involvement in decreased core strength and hindering R hip flexor/adductor and distal abdominal insertion healing.  Supine bridge with hip adductor ball and glute max squeeze 10x3  Supine lower trunk rotation isometric, PT manually resisted 10x3 each direction   Sitting with propper posture  Manually resisted trunk rotation 10x3 each way   L latearl shift isometrics (to counter R lateral shift posture in sitting) 10x3 with 5 second holds    Seated manually resisted R upper trunk rotation to decrease R lower trunk rotation 10x5 seconds   R hip flexor muscle use felt.    Reviewed plan of care: continue 1x/week for 8 weeks to work on core and R hip flexor strength as appropraite.       Improved exercise technique, movement at target joints, use of target muscles aftermin tomod verbal, visual, tactile cues.    Response to treatment Pt tolerated session well without aggravation of symptoms.R hip feels better after session.     Clinical presentation Pt making overall progress with overall decreased R hip pain, overall improved hip strength (though R hip extension strength decreased compared to last measurement), and improved ability to jog and participate in speed and agility drills at the gym comfortably. Pt however recently experienced a set back about 3 days ago with increased R hip soreness in which increased activities from the day before may play a factor. Difficulty performing supine straight leg raise R hip flexion which improved with  manual pelvic compression by PT simulating core muscle use. Possible abdominal hernia from her pregnancy involvement in decreased core strength and slow progress with rehab. Worked on decreasing L hip IR limitations, R hip flexor, glute and gentle core strength to help address. Decreased R hip pain and soreness reported after session. Pt will benefit from continued skilled physical therapy services to decrease pain, improve strength and function.   RECERT    PT Short Term Goals - 05/01/19 1149  PT SHORT TERM GOAL #1   Title  Patient will be independent with her HEP to decrease pain, improve strength and function.    Baseline  Pt has started her HEP (03/19/2019); Pt demonstrates independence with her HEP (05/01/2019)    Time  3    Period  Weeks    Status  Achieved    Target Date  04/10/19        PT Long Term Goals - 07/08/19 0855      PT LONG TERM GOAL #1   Title  Pt will have a decrease in R hip pain to 2/10 or less at worst to promote ability to run, perform exercises, stand up from a half kneeling position more comfortably.    Baseline  6/10 R hip pain at most (03/19/2019); 2-3/10 at most for the past 7 days (05/01/2019); 2-3/10 at most for the past 7 days (05/12/19), (05/26/2019); 3/10 (07/08/2019)    Time  8    Period  Weeks    Status  Partially Met    Target Date  09/04/19      PT LONG TERM GOAL #2   Title  Patient will improve R hip flexion, extension, abduction, and adduction strength by at least 1/2 MMT grade to promote ability to perform functional tasks more comfortably.    Baseline  improved by 1/2 MMT grade but pain with hip adduction (05/26/2019); maintained except hip extension (07/08/2019)    Time  8    Period  Weeks    Status  Partially Met    Target Date  09/04/19      PT LONG TERM GOAL #3   Title  Pt will be able to jog for at least 5 minutes without R hip pain to promote ability to participate in exercise and promote physical health    Baseline  increased R hip  pain when jogging or running (03/19/2019); able to perform mini jog with short stride length (05/01/2019), (05/26/2019); Able to jog about 5 min except for this past Monday (07/08/2019)    Time  8    Period  Weeks    Status  Partially Met    Target Date  09/04/19            Plan - 07/08/19 0854    Clinical Impression Statement  Pt making overall progress with overall decreased R hip pain, overall improved hip strength (though R hip extension strength decreased compared to last measurement), and improved ability to jog and participate in speed and agility drills at the gym comfortably. Pt however recently experienced a set back about 3 days ago with increased R hip soreness in which increased activities from the day before may play a factor. Difficulty performing supine straight leg raise R hip flexion which improved with manual pelvic compression by PT simulating core muscle use. Possible abdominal hernia from her pregnancy involvement in decreased core strength and slow progress with rehab. Worked on decreasing L hip IR limitations, R hip flexor, glute and gentle core strength to help address. Decreased R hip pain and soreness reported after session. Pt will benefit from continued skilled physical therapy services to decrease pain, improve strength and function.    Personal Factors and Comorbidities  Comorbidity 1    Comorbidities  hx of C-section    Examination-Activity Limitations  Squat    Stability/Clinical Decision Making  Stable/Uncomplicated    Rehab Potential  Good    PT Frequency  1x / week    PT Duration  8 weeks    PT Treatment/Interventions  Therapeutic activities;Therapeutic exercise;Neuromuscular re-education;Patient/family education;Manual techniques;Dry needling;Spinal Manipulations;Joint Manipulations;Aquatic Therapy;Electrical Stimulation;Iontophoresis 44m/ml Dexamethasone;Ultrasound    PT Next Visit Plan  isometric, then concentric, then eccentric hip and abdominal  strengthening, manual techniques, modalities PRN    PT Home Exercise Plan  Medbridge Access Code: [email or web address]    Consulted and Agree with Plan of Care  Patient       Patient will benefit from skilled therapeutic intervention in order to improve the following deficits and impairments:  Pain, Improper body mechanics, Postural dysfunction, Decreased strength  Visit Diagnosis: Pain in right hip - Plan: PT plan of care cert/re-cert  Muscle weakness (generalized) - Plan: PT plan of care cert/re-cert     Problem List Patient Active Problem List   Diagnosis Date Noted  . S/P cesarean section - triplets 11/29 02/17/2012  . Postpartum care following cesarean delivery (11/29) 02/17/2012  . Rh negative, maternal 02/17/2012  . 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 Routine general medical examination at a health care facility 06/23/2010  . FIBROCYSTIC BREAST DISEASE 07/17/2008  . Asthma, 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    MJoneen BoersPT, DPT   07/08/2019, 10:38 AM  [facility] [address] [place] [address]Phone: [phone]  Fax:  [phone] Name: Nicole TRETTINMRN: [id number] of Birth: 9January 27, 1982

## 2019-07-15 ENCOUNTER — Ambulatory Visit: Payer: BC Managed Care – PPO

## 2019-07-15 ENCOUNTER — Other Ambulatory Visit: Payer: Self-pay

## 2019-07-15 DIAGNOSIS — M25551 Pain in right hip: Secondary | ICD-10-CM

## 2019-07-15 DIAGNOSIS — M6281 Muscle weakness (generalized): Secondary | ICD-10-CM | POA: Diagnosis not present

## 2019-07-15 NOTE — Therapy (Signed)
[facility] [address] [place], [address] Phone: [phone]   Fax:  [phone]  Physical Therapy Treatment  Patient Details  Name: Nicole Owens MRN: [id number] Date of Birth: 28-Dec-1980 Referring Provider (PT): Lynne Leader, MD   Encounter Date: 07/15/2019  PT End of Session - 07/15/19 0802    Visit Number  22    Number of Visits  31    Date for PT Re-Evaluation  09/04/19    PT Start Time  0802    PT Stop Time  9306    PT Time Calculation (min)  53 min    Activity Tolerance  Patient tolerated treatment well    Behavior During Therapy  [facility] for tasks assessed/performed       Past Medical History:  Diagnosis Date  . Asthma   . Asthma, intermittent    mild  . Carpal tunnel syndrome on both sides    with pregnancy  . Female infertility 04/30/2010   history restored after error with record merge  . Heartburn in pregnancy    nausea  . 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 Postpartum care following cesarean delivery 02/17/2012  . Rh negative, maternal 02/17/2012  . S/P cesarean section - triplets 11/29 02/17/2012    Past Surgical History:  Procedure Laterality Date  . CESAREAN SECTION  02/16/2012   Procedure: CESAREAN SECTION;  Surgeon: Lovenia Kim, MD;  Location: [facility];  Service: Obstetrics;  Laterality: N/A;  . NO PAST SURGERIES    . WISDOM TOOTH EXTRACTION      There were no vitals filed for this visit.  Subjective Assessment - 07/15/19 0803    Subjective  R hip is doing ok. Still sore but not as bad as last week. Was just the designated hitter Friday for softball. R hip bothered her when sprinting to first base.  Did not work out this morning.    Pertinent History  R hip pain. Pain began mid November 2020. Pt was at the gym. Pt was performing scissor kicks. Felt a little pain more than normal. Went for a backward lunge with R LE and felt sharp pain R lower abdomen. Took 10 days  off and iced that area. Did not really get better. Was diagnosed with sports hernia. Still getting pain but getting a little better. Could not jog before 6 weeks ago. Thinks she can jog slowly now. Using her R LE to stand up from a R half kneeling position bothers her. Pt goes to the gym 6 days a week before, no goes to the gym 1 time a week and performs modified upper body work out. Pivoting the wrong way sometimes would hurt. Going up the steps 2 at a time bothers her R hip. Getting out of bed involving SLR hip flexion movement would bother it. Coughing, sneezing would bother R hip. Had C section 7 years ago for triplets. The pain is along the line of the incision.    Currently in Pain?  Yes    Pain Score  1                                PT Education - 07/15/19 0826    Education Details  ther-ex    Person(s) Educated  Patient    Methods  Explanation;Demonstration;Tactile cues;Verbal cues    Comprehension  Returned demonstration;Verbalized understanding  Objective   No latex allergies    MedbridgeAccess Code: [email or web address]    Pt wants to work up to being able to do planks, pushups, sit ups, spider and mountain climbers, jumps  Therapeutic exercise    Jogging 500 ft, stride length enough to not have hip pain.   Supine L hip IR stretch (limited compared to R) 1 min x 3  Supine with hip at 90/90, L 10x3   Supine R hip IR stretch (limited compared to R) 1 min x 3  Supine assisted SLR R hip flexion, emphasis on eccentric return with transversus abdominis activation and with PT assist 4x3  S/L R hip adduction PT assisted with transversus abdominis contraction 5x4  Supine bridge with hip adductor ball and glute max squeeze 10x3  Supine heel slides with transversus abdominis contraction   R 10x3  Pallof press sitting resisting double red band  R 10x3 with 5 seconds   L 10x3 with 5 seconds     Improved exercise technique, movement  at target joints, use of target muscles aftermin tomod verbal, visual, tactile cues.    Response to treatment Pt tolerated session well without aggravation of symptoms.R hip feels better after session.     Clinical presentation Continued working improving hip IR ROM, as well as applying proper stress to her R hip flexor, adductor and transversus abdominis muscles to promote proper healing and improve strength. Good muscle workout felt, pt tolerated session well without aggravation of symptoms. Pt will benefit from continued skilled physical therapy services to decrease pain, improve strength, and function.      PT Short Term Goals - 05/01/19 1149      PT SHORT TERM GOAL #1   Title  Patient will be independent with her HEP to decrease pain, improve strength and function.    Baseline  Pt has started her HEP (03/19/2019); Pt demonstrates independence with her HEP (05/01/2019)    Time  3    Period  Weeks    Status  Achieved    Target Date  04/10/19        PT Long Term Goals - 07/08/19 0855      PT LONG TERM GOAL #1   Title  Pt will have a decrease in R hip pain to 2/10 or less at worst to promote ability to run, perform exercises, stand up from a half kneeling position more comfortably.    Baseline  6/10 R hip pain at most (03/19/2019); 2-3/10 at most for the past 7 days (05/01/2019); 2-3/10 at most for the past 7 days (05/12/19), (05/26/2019); 3/10 (07/08/2019)    Time  8    Period  Weeks    Status  Partially Met    Target Date  09/04/19      PT LONG TERM GOAL #2   Title  Patient will improve R hip flexion, extension, abduction, and adduction strength by at least 1/2 MMT grade to promote ability to perform functional tasks more comfortably.    Baseline  improved by 1/2 MMT grade but pain with hip adduction (05/26/2019); maintained except hip extension (07/08/2019)    Time  8    Period  Weeks    Status  Partially Met    Target Date  09/04/19      PT LONG TERM GOAL #3    Title  Pt will be able to jog for at least 5 minutes without R hip pain to promote ability to participate in exercise and promote physical health  Baseline  increased R hip pain when jogging or running (03/19/2019); able to perform mini jog with short stride length (05/01/2019), (05/26/2019); Able to jog about 5 min except for this past Monday (07/08/2019)    Time  8    Period  Weeks    Status  Partially Met    Target Date  09/04/19            Plan - 07/15/19 0828    Clinical Impression Statement  Continued working improving hip IR ROM, as well as applying proper stress to her R hip flexor, adductor and transversus abdominis muscles to promote proper healing and improve strength. Good muscle workout felt, pt tolerated session well without aggravation of symptoms. Pt will benefit from continued skilled physical therapy services to decrease pain, improve strength, and function.    Personal Factors and Comorbidities  Comorbidity 1    Comorbidities  hx of C-section    Examination-Activity Limitations  Squat    Stability/Clinical Decision Making  Stable/Uncomplicated    Rehab Potential  Good    PT Frequency  1x / week    PT Duration  8 weeks    PT Treatment/Interventions  Therapeutic activities;Therapeutic exercise;Neuromuscular re-education;Patient/family education;Manual techniques;Dry needling;Spinal Manipulations;Joint Manipulations;Aquatic Therapy;Electrical Stimulation;Iontophoresis 67m/ml Dexamethasone;Ultrasound    PT Next Visit Plan  isometric, then concentric, then eccentric hip and abdominal strengthening, manual techniques, modalities PRN    PT Home Exercise Plan  Medbridge Access Code: [email or web address]    Consulted and Agree with Plan of Care  Patient       Patient will benefit from skilled therapeutic intervention in order to improve the following deficits and impairments:  Pain, Improper body mechanics, Postural dysfunction, Decreased strength  Visit Diagnosis: Pain in right  hip  Muscle weakness (generalized)     Problem List Patient Active Problem List   Diagnosis Date Noted  . S/P cesarean section - triplets 11/29 02/17/2012  . Postpartum care following cesarean delivery (11/29) 02/17/2012  . Rh negative, maternal 02/17/2012  . 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 Routine general medical examination at a health care facility 06/23/2010  . FIBROCYSTIC BREAST DISEASE 07/17/2008  . Asthma, 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    MJoneen BoersPT, DPT   07/15/2019, 9:07 AM  [facility] [address] [place] [address]Phone: [phone]  Fax:  [phone] Name: NCHANNELLE BOTTGERMRN: [id number] of Birth: 903-11-1980

## 2019-07-22 ENCOUNTER — Other Ambulatory Visit: Payer: Self-pay

## 2019-07-22 ENCOUNTER — Ambulatory Visit: Payer: BC Managed Care – PPO | Attending: Family Medicine

## 2019-07-22 DIAGNOSIS — M25551 Pain in right hip: Secondary | ICD-10-CM

## 2019-07-22 DIAGNOSIS — M6281 Muscle weakness (generalized): Secondary | ICD-10-CM | POA: Diagnosis not present

## 2019-07-22 NOTE — Therapy (Signed)
[facility] [address] [place], [address] Phone: [phone]   Fax:  [phone]  Physical Therapy Treatment  Patient Details  Name: Nicole Owens MRN: [id number] Date of Birth: 10-Feb-1981 Referring Provider (PT): Lynne Leader, MD   Encounter Date: 07/22/2019  PT End of Session - 07/22/19 0849    Visit Number  23    Number of Visits  31    Date for PT Re-Evaluation  09/04/19    PT Start Time  0849    PT Stop Time  0931    PT Time Calculation (min)  42 min    Activity Tolerance  Patient tolerated treatment well    Behavior During Therapy  [facility] for tasks assessed/performed       Past Medical History:  Diagnosis Date  . Asthma   . Asthma, intermittent    mild  . Carpal tunnel syndrome on both sides    with pregnancy  . Female infertility 04/30/2010   history restored after error with record merge  . Heartburn in pregnancy    nausea  . 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 Postpartum care following cesarean delivery 02/17/2012  . Rh negative, maternal 02/17/2012  . S/P cesarean section - triplets 11/29 02/17/2012    Past Surgical History:  Procedure Laterality Date  . CESAREAN SECTION  02/16/2012   Procedure: CESAREAN SECTION;  Surgeon: Lovenia Kim, MD;  Location: [facility];  Service: Obstetrics;  Laterality: N/A;  . NO PAST SURGERIES    . WISDOM TOOTH EXTRACTION      There were no vitals filed for this visit.  Subjective Assessment - 07/22/19 0850    Subjective  R hip is not painful, but just sore. This morning was cardio day. Only did 1 building lap and the jump rope. Not painful but feels different. Feels more tight.  Would have done the building laps if did not have PT.  R anterior hip feels like it's popping wiht leg swings at the [employer] bar workout (hip flexor muscle area). Does not hurt or happen any other time.    Pertinent History  R hip pain. Pain began mid November 2020. Pt was  at the gym. Pt was performing scissor kicks. Felt a little pain more than normal. Went for a backward lunge with R LE and felt sharp pain R lower abdomen. Took 10 days off and iced that area. Did not really get better. Was diagnosed with sports hernia. Still getting pain but getting a little better. Could not jog before 6 weeks ago. Thinks she can jog slowly now. Using her R LE to stand up from a R half kneeling position bothers her. Pt goes to the gym 6 days a week before, no goes to the gym 1 time a week and performs modified upper body work out. Pivoting the wrong way sometimes would hurt. Going up the steps 2 at a time bothers her R hip. Getting out of bed involving SLR hip flexion movement would bother it. Coughing, sneezing would bother R hip. Had C section 7 years ago for triplets. The pain is along the line of the incision.    Currently in Pain?  No/denies    Pain Score  0-No pain   mainly tight                              PT Education - 07/22/19 5885  Education Details  ther-ex    Person(s) Educated  Patient    Methods  Explanation;Demonstration;Tactile cues;Verbal cues    Comprehension  Returned demonstration;Verbalized understanding      Objective   No latex allergies    MedbridgeAccess Code: [email or web address]    Pt wants to work up to being able to do planks, pushups, sit ups, spider and mountain climbers, jumps  Therapeutic exercise  Walking lunges 32 ft x 3 forward  32 x 2 backward  Standing hip adduction yellow band 10x  Then with eccentric return 10x2  Standing R hip flexion resising yellow band 10x3  Side squats 32 ft to the R and 32 ft to the L for 3 sets    Single leg dead lift with 2 finger assist  R 10x3  L 10x3  Kneeling pushups, knees on Air Ex pad  10x2  Then regular push-ups 5x  Able to perform with only 1/10 R hip discomfort.   Supine L hip IR stretch (limited compared to R) 1 min x 3  Supine with hip at 90/90, L  10x3  L hip IR = R    Improved exercise technique, movement at target joints, use of target muscles aftermin tomod verbal, visual, tactile cues.    Response to treatment Pt tolerated session well without aggravation of symptoms.No pain, or tightness in her R hip after session. Just fatigue.     Clinical presentation Able to perform 5 regular push-ups today with only 1/10 R hip discomfort. Pt making progress with ability to workout with less pain. Continued working on increasing R hip flexor, adductor, and glute strength and load bearing tolerance. Pt tolerated session well without aggravation of symptoms. Pt will benefit from continued skilled physical therapy services to decrease pain, improve strength, function, and ability to participate in more demanding workouts at her gym to promote fitness.       PT Short Term Goals - 05/01/19 1149      PT SHORT TERM GOAL #1   Title  Patient will be independent with her HEP to decrease pain, improve strength and function.    Baseline  Pt has started her HEP (03/19/2019); Pt demonstrates independence with her HEP (05/01/2019)    Time  3    Period  Weeks    Status  Achieved    Target Date  04/10/19        PT Long Term Goals - 07/08/19 0855      PT LONG TERM GOAL #1   Title  Pt will have a decrease in R hip pain to 2/10 or less at worst to promote ability to run, perform exercises, stand up from a half kneeling position more comfortably.    Baseline  6/10 R hip pain at most (03/19/2019); 2-3/10 at most for the past 7 days (05/01/2019); 2-3/10 at most for the past 7 days (05/12/19), (05/26/2019); 3/10 (07/08/2019)    Time  8    Period  Weeks    Status  Partially Met    Target Date  09/04/19      PT LONG TERM GOAL #2   Title  Patient will improve R hip flexion, extension, abduction, and adduction strength by at least 1/2 MMT grade to promote ability to perform functional tasks more comfortably.    Baseline  improved by 1/2 MMT  grade but pain with hip adduction (05/26/2019); maintained except hip extension (07/08/2019)    Time  8    Period  Weeks    Status  Partially Met    Target Date  09/04/19      PT LONG TERM GOAL #3   Title  Pt will be able to jog for at least 5 minutes without R hip pain to promote ability to participate in exercise and promote physical health    Baseline  increased R hip pain when jogging or running (03/19/2019); able to perform mini jog with short stride length (05/01/2019), (05/26/2019); Able to jog about 5 min except for this past Monday (07/08/2019)    Time  8    Period  Weeks    Status  Partially Met    Target Date  09/04/19            Plan - 07/22/19 0945    Clinical Impression Statement  Able to perform 5 regular push-ups today with only 1/10 R hip discomfort. Pt making progress with ability to workout with less pain. Continued working on increasing R hip flexor, adductor, and glute strength and load bearing tolerance. Pt tolerated session well without aggravation of symptoms. Pt will benefit from continued skilled physical therapy services to decrease pain, improve strength, function, and ability to participate in more demanding workouts at her gym to promote fitness.    Personal Factors and Comorbidities  Comorbidity 1    Comorbidities  hx of C-section    Examination-Activity Limitations  Squat    Stability/Clinical Decision Making  Stable/Uncomplicated    Rehab Potential  Good    PT Frequency  1x / week    PT Duration  8 weeks    PT Treatment/Interventions  Therapeutic activities;Therapeutic exercise;Neuromuscular re-education;Patient/family education;Manual techniques;Dry needling;Spinal Manipulations;Joint Manipulations;Aquatic Therapy;Electrical Stimulation;Iontophoresis 10m/ml Dexamethasone;Ultrasound    PT Next Visit Plan  isometric, then concentric, then eccentric hip and abdominal strengthening, manual techniques, modalities PRN    PT Home Exercise Plan  Medbridge Access  Code: [email or web address]    Consulted and Agree with Plan of Care  Patient       Patient will benefit from skilled therapeutic intervention in order to improve the following deficits and impairments:  Pain, Improper body mechanics, Postural dysfunction, Decreased strength  Visit Diagnosis: Pain in right hip  Muscle weakness (generalized)     Problem List Patient Active Problem List   Diagnosis Date Noted  . S/P cesarean section - triplets 11/29 02/17/2012  . Postpartum care following cesarean delivery (11/29) 02/17/2012  . Rh negative, maternal 02/17/2012  . 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 Routine general medical examination at a health care facility 06/23/2010  . FIBROCYSTIC BREAST DISEASE 07/17/2008  . Asthma, 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    MJoneen BoersPT, DPT   07/22/2019, 9:47 AM  [facility] [address] [place] [address]Phone: [phone]  Fax:  [phone] Name: Nicole BRUNEYMRN: [id number] of Birth: [birth year]

## 2019-08-06 ENCOUNTER — Ambulatory Visit: Payer: BC Managed Care – PPO

## 2019-08-06 ENCOUNTER — Other Ambulatory Visit: Payer: Self-pay

## 2019-08-06 DIAGNOSIS — M25551 Pain in right hip: Secondary | ICD-10-CM | POA: Diagnosis not present

## 2019-08-06 DIAGNOSIS — M6281 Muscle weakness (generalized): Secondary | ICD-10-CM

## 2019-08-06 NOTE — Therapy (Signed)
[facility] [address] [place], [address] Phone: [phone]   Fax:  [phone]  Physical Therapy Treatment  Patient Details  Name: Nicole Owens MRN: [id number] Date of Birth: 1980/05/05 Referring Provider (PT): Lynne Leader, MD   Encounter Date: 08/06/2019  PT End of Session - 08/06/19 0805    Visit Number  24    Number of Visits  31    Date for PT Re-Evaluation  09/04/19    PT Start Time  0805    PT Stop Time  6378    PT Time Calculation (min)  42 min    Activity Tolerance  Patient tolerated treatment well    Behavior During Therapy  [facility] for tasks assessed/performed       Past Medical History:  Diagnosis Date  . Asthma   . Asthma, intermittent    mild  . Carpal tunnel syndrome on both sides    with pregnancy  . Female infertility 04/30/2010   history restored after error with record merge  . Heartburn in pregnancy    nausea  . 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 Postpartum care following cesarean delivery 02/17/2012  . Rh negative, maternal 02/17/2012  . S/P cesarean section - triplets 11/29 02/17/2012    Past Surgical History:  Procedure Laterality Date  . CESAREAN SECTION  02/16/2012   Procedure: CESAREAN SECTION;  Surgeon: Lovenia Kim, MD;  Location: [facility];  Service: Obstetrics;  Laterality: N/A;  . NO PAST SURGERIES    . WISDOM TOOTH EXTRACTION      There were no vitals filed for this visit.  Subjective Assessment - 08/06/19 0807    Subjective  Feels pretty good 90% of the time. Still has difficulty with movements such as side planks. Also R hip popping sensation at times with hip flexion, abduction and extension simultaneously.    Pertinent History  R hip pain. Pain began mid November 2020. Pt was at the gym. Pt was performing scissor kicks. Felt a little pain more than normal. Went for a backward lunge with R LE and felt sharp pain R lower abdomen. Took 10 days off  and iced that area. Did not really get better. Was diagnosed with sports hernia. Still getting pain but getting a little better. Could not jog before 6 weeks ago. Thinks she can jog slowly now. Using her R LE to stand up from a R half kneeling position bothers her. Pt goes to the gym 6 days a week before, no goes to the gym 1 time a week and performs modified upper body work out. Pivoting the wrong way sometimes would hurt. Going up the steps 2 at a time bothers her R hip. Getting out of bed involving SLR hip flexion movement would bother it. Coughing, sneezing would bother R hip. Had C section 7 years ago for triplets. The pain is along the line of the incision.    Currently in Pain?  No/denies    Pain Score  0-No pain                                PT Education - 08/06/19 0814    Education Details  ther-ex    Person(s) Educated  Patient    Methods  Explanation;Demonstration;Tactile cues;Verbal cues    Comprehension  Returned demonstration;Verbalized understanding       Objective   No latex allergies  MedbridgeAccess Code: [email or web address]    Pt wants to work up to being able to do planks, pushups, sit ups, spider and mountain climbers, jumps  Therapeutic exercise  Walking lunges with red band around thighs 32 ft x 3 forward             32 x 3 backward  Standing R hip flexion, abduction, extension, then reverse with 3 lbs ankle weight 10x3, popping free range of motion  R LE single leg dead lift 15x2 with 2 finger assist   Standing R hip adduction yellow band, 10x3 with 1-2 second holds, to the barrier of comfort level   Seated R hip ER with 3 lbs ankle weight 10x5 seconds for 3 sets  Seated R hip IR with 3 lbs ankle weight 10x5 seconds for 3 sets  Supine R hip IR with PT 1 min x 3  Standing B shoulder extension yellow band 10x5 seconds for 2 sets to promote trunk muscle use.   Improved exercise technique, movement at target joints, use of  target muscles aftermin tomod verbal, visual, tactile cues.    Response to treatment Pt tolerated session well without aggravation of symptoms.      Clinical presentation Continued promoting load tolerance to R hip flexor and adductor muscles as well as overall R hip strengthening. No complain of pain throughout session. Pt will benefit from continued skilled physical therapy services to decrease pain, improve strength and function.       PT Short Term Goals - 05/01/19 1149      PT SHORT TERM GOAL #1   Title  Patient will be independent with her HEP to decrease pain, improve strength and function.    Baseline  Pt has started her HEP (03/19/2019); Pt demonstrates independence with her HEP (05/01/2019)    Time  3    Period  Weeks    Status  Achieved    Target Date  04/10/19        PT Long Term Goals - 07/08/19 0855      PT LONG TERM GOAL #1   Title  Pt will have a decrease in R hip pain to 2/10 or less at worst to promote ability to run, perform exercises, stand up from a half kneeling position more comfortably.    Baseline  6/10 R hip pain at most (03/19/2019); 2-3/10 at most for the past 7 days (05/01/2019); 2-3/10 at most for the past 7 days (05/12/19), (05/26/2019); 3/10 (07/08/2019)    Time  8    Period  Weeks    Status  Partially Met    Target Date  09/04/19      PT LONG TERM GOAL #2   Title  Patient will improve R hip flexion, extension, abduction, and adduction strength by at least 1/2 MMT grade to promote ability to perform functional tasks more comfortably.    Baseline  improved by 1/2 MMT grade but pain with hip adduction (05/26/2019); maintained except hip extension (07/08/2019)    Time  8    Period  Weeks    Status  Partially Met    Target Date  09/04/19      PT LONG TERM GOAL #3   Title  Pt will be able to jog for at least 5 minutes without R hip pain to promote ability to participate in exercise and promote physical health    Baseline  increased R hip  pain when jogging or running (03/19/2019); able to perform mini jog with short stride  length (05/01/2019), (05/26/2019); Able to jog about 5 min except for this past Monday (07/08/2019)    Time  8    Period  Weeks    Status  Partially Met    Target Date  09/04/19            Plan - 08/06/19 0814    Clinical Impression Statement  Continued promoting load tolerance to R hip flexor and adductor muscles as well as overall R hip strengthening. No complain of pain throughout session. Pt will benefit from continued skilled physical therapy services to decrease pain, improve strength and function.    Personal Factors and Comorbidities  Comorbidity 1    Comorbidities  hx of C-section    Examination-Activity Limitations  Squat    Stability/Clinical Decision Making  Stable/Uncomplicated    Rehab Potential  Good    PT Frequency  1x / week    PT Duration  8 weeks    PT Treatment/Interventions  Therapeutic activities;Therapeutic exercise;Neuromuscular re-education;Patient/family education;Manual techniques;Dry needling;Spinal Manipulations;Joint Manipulations;Aquatic Therapy;Electrical Stimulation;Iontophoresis 52m/ml Dexamethasone;Ultrasound    PT Next Visit Plan  isometric, then concentric, then eccentric hip and abdominal strengthening, manual techniques, modalities PRN    PT Home Exercise Plan  Medbridge Access Code: [email or web address]    Consulted and Agree with Plan of Care  Patient       Patient will benefit from skilled therapeutic intervention in order to improve the following deficits and impairments:  Pain, Improper body mechanics, Postural dysfunction, Decreased strength  Visit Diagnosis: Pain in right hip  Muscle weakness (generalized)     Problem List Patient Active Problem List   Diagnosis Date Noted  . S/P cesarean section - triplets 11/29 02/17/2012  . Postpartum care following cesarean delivery (11/29) 02/17/2012  . Rh negative, maternal 02/17/2012  . Multiple gestation - triplets  08/10/2011  . Routine general medical examination at a health care facility 06/23/2010  . FIBROCYSTIC BREAST DISEASE 07/17/2008  . Asthma, 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    MJoneen BoersPT, DPT   08/06/2019, 1:12 PM  [facility] [address] [place] [address]Phone: [phone]  Fax:  [phone] Name: NKAILEA DANNEMILLERMRN: [id number] of Birth: [birth year]

## 2019-08-12 ENCOUNTER — Other Ambulatory Visit: Payer: Self-pay

## 2019-08-12 ENCOUNTER — Ambulatory Visit: Payer: BC Managed Care – PPO

## 2019-08-12 DIAGNOSIS — M25551 Pain in right hip: Secondary | ICD-10-CM

## 2019-08-12 DIAGNOSIS — M6281 Muscle weakness (generalized): Secondary | ICD-10-CM

## 2019-08-12 NOTE — Therapy (Signed)
[facility] [address] [place], [address] Phone: [phone]   Fax:  [phone]  Physical Therapy Treatment  Patient Details  Name: Nicole Owens MRN: [id number] Date of Birth: Mar 25, 1980 Referring Provider (PT): Lynne Leader, MD   Encounter Date: 08/12/2019  PT End of Session - 08/12/19 1721    Visit Number  25    Number of Visits  31    Date for PT Re-Evaluation  09/04/19    PT Start Time  1017    PT Stop Time  1804    PT Time Calculation (min)  43 min    Activity Tolerance  Patient tolerated treatment well    Behavior During Therapy  [facility] for tasks assessed/performed       Past Medical History:  Diagnosis Date  . Asthma   . Asthma, intermittent    mild  . Carpal tunnel syndrome on both sides    with pregnancy  . Female infertility 04/30/2010   history restored after error with record merge  . Heartburn in pregnancy    nausea  . 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 Postpartum care following cesarean delivery 02/17/2012  . Rh negative, maternal 02/17/2012  . S/P cesarean section - triplets 11/29 02/17/2012    Past Surgical History:  Procedure Laterality Date  . CESAREAN SECTION  02/16/2012   Procedure: CESAREAN SECTION;  Surgeon: Lovenia Kim, MD;  Location: [facility];  Service: Obstetrics;  Laterality: N/A;  . NO PAST SURGERIES    . WISDOM TOOTH EXTRACTION      There were no vitals filed for this visit.  Subjective Assessment - 08/12/19 1722    Subjective  Ran 4 building laps, slow, R hip did not hurt. Came to session just right after the gym. No R hip pain currently.    Pertinent History  R hip pain. Pain began mid November 2020. Pt was at the gym. Pt was performing scissor kicks. Felt a little pain more than normal. Went for a backward lunge with R LE and felt sharp pain R lower abdomen. Took 10 days off and iced that area. Did not really get better. Was diagnosed with sports  hernia. Still getting pain but getting a little better. Could not jog before 6 weeks ago. Thinks she can jog slowly now. Using her R LE to stand up from a R half kneeling position bothers her. Pt goes to the gym 6 days a week before, no goes to the gym 1 time a week and performs modified upper body work out. Pivoting the wrong way sometimes would hurt. Going up the steps 2 at a time bothers her R hip. Getting out of bed involving SLR hip flexion movement would bother it. Coughing, sneezing would bother R hip. Had C section 7 years ago for triplets. The pain is along the line of the incision.    Currently in Pain?  No/denies    Pain Score  0-No pain                                PT Education - 08/12/19 1734    Education Details  ther-ex    Person(s) Educated  Patient    Methods  Explanation;Demonstration;Tactile cues;Verbal cues    Comprehension  Returned demonstration;Verbalized understanding      Objective   No latex allergies    MedbridgeAccess Code: [email or web address]  Pt wants to work up to being able to do planks, pushups, sit ups, spider and mountain climbers, jumps  Therapeutic exercise  Supine 1/2 SLR hip flexion with PT assist 2 lbs 5x3 Then supine 1/2 SLR hip flexion, no PT assist, no weight 3x. Able to perform first 2 repetitions fine. 3-4/10 pain at 3rd repetition   Seated R hip IR with 3 lbs ankle weight 10x5 seconds for 3 sets  Seated R hip ER with 3 lbs ankle weight 10x5 seconds for 3 sets  Standing R hip adduction yellow band, 10x3 with 1-2 second holds, to the barrier of comfort level  Walking lunges with red band around thighs 32 ft x 3 forward 32 x 3 backward  Kneeling push-ups 10x, no pain  Regular push-ups, 5x. Only 1/10 pain in hip    R LE single leg dead lift 10x3 with 2 finger assist  Prone  glute max extension R 10x5 seconds for 2 sets with isometric knee extension with PT resistance to decrease hamstring  use    Improved exercise technique, movement at target joints, use of target muscles aftermin tomod verbal, visual, tactile cues.    Response to treatment Pt tolerated session well without aggravation of symptoms.      Clinical presentation Continued working on hip flexor, adductor strengthening to increase load tolerance. Pt able to perform supine SLR R hip flexion 1/2 way 3 times independently. Increased discomfort to 3-4/10 at 3rd repetition but eased with rest. No reports of pain after session. Pt also currently able to perform regular push-ups 5x with only 1/10 R hip symptoms. Pt will benefit from continued skilled physical therapy services to decrease pain, improve strength and function.       PT Short Term Goals - 05/01/19 1149      PT SHORT TERM GOAL #1   Title  Patient will be independent with her HEP to decrease pain, improve strength and function.    Baseline  Pt has started her HEP (03/19/2019); Pt demonstrates independence with her HEP (05/01/2019)    Time  3    Period  Weeks    Status  Achieved    Target Date  04/10/19        PT Long Term Goals - 07/08/19 0855      PT LONG TERM GOAL #1   Title  Pt will have a decrease in R hip pain to 2/10 or less at worst to promote ability to run, perform exercises, stand up from a half kneeling position more comfortably.    Baseline  6/10 R hip pain at most (03/19/2019); 2-3/10 at most for the past 7 days (05/01/2019); 2-3/10 at most for the past 7 days (05/12/19), (05/26/2019); 3/10 (07/08/2019)    Time  8    Period  Weeks    Status  Partially Met    Target Date  09/04/19      PT LONG TERM GOAL #2   Title  Patient will improve R hip flexion, extension, abduction, and adduction strength by at least 1/2 MMT grade to promote ability to perform functional tasks more comfortably.    Baseline  improved by 1/2 MMT grade but pain with hip adduction (05/26/2019); maintained except hip extension (07/08/2019)    Time  8     Period  Weeks    Status  Partially Met    Target Date  09/04/19      PT LONG TERM GOAL #3   Title  Pt will be able to  jog for at least 5 minutes without R hip pain to promote ability to participate in exercise and promote physical health    Baseline  increased R hip pain when jogging or running (03/19/2019); able to perform mini jog with short stride length (05/01/2019), (05/26/2019); Able to jog about 5 min except for this past Monday (07/08/2019)    Time  8    Period  Weeks    Status  Partially Met    Target Date  09/04/19            Plan - 08/12/19 1821    Clinical Impression Statement  Continued working on hip flexor, adductor strengthening to increase load tolerance. Pt able to perform supine SLR R hip flexion 1/2 way 3 times independently. Increased discomfort to 3-4/10 at 3rd repetition but eased with rest. No reports of pain after session. Pt also currently able to perform regular push-ups 5x with only 1/10 R hip symptoms. Pt will benefit from continued skilled physical therapy services to decrease pain, improve strength and function.    Personal Factors and Comorbidities  Comorbidity 1    Comorbidities  hx of C-section    Examination-Activity Limitations  Squat    Stability/Clinical Decision Making  Stable/Uncomplicated    Rehab Potential  Good    PT Frequency  1x / week    PT Duration  8 weeks    PT Treatment/Interventions  Therapeutic activities;Therapeutic exercise;Neuromuscular re-education;Patient/family education;Manual techniques;Dry needling;Spinal Manipulations;Joint Manipulations;Aquatic Therapy;Electrical Stimulation;Iontophoresis 52m/ml Dexamethasone;Ultrasound    PT Next Visit Plan  isometric, then concentric, then eccentric hip and abdominal strengthening, manual techniques, modalities PRN    PT Home Exercise Plan  Medbridge Access Code: [email or web address]    Consulted and Agree with Plan of Care  Patient       Patient will benefit from skilled therapeutic intervention in  order to improve the following deficits and impairments:  Pain, Improper body mechanics, Postural dysfunction, Decreased strength  Visit Diagnosis: Pain in right hip  Muscle weakness (generalized)     Problem List Patient Active Problem List   Diagnosis Date Noted  . S/P cesarean section - triplets 11/29 02/17/2012  . Postpartum care following cesarean delivery (11/29) 02/17/2012  . Rh negative, maternal 02/17/2012  . 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 Routine general medical examination at a health care facility 06/23/2010  . FIBROCYSTIC BREAST DISEASE 07/17/2008  . Asthma, 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    MJoneen BoersPT, DPT   08/12/2019, 6:25 PM  [facility] [address] [place] [address]Phone: [phone]  Fax:  [phone] Name: NJODIE CAVEYMRN: [id number] of Birth: 912/29/1982

## 2019-08-16 ENCOUNTER — Other Ambulatory Visit: Payer: Self-pay | Admitting: Internal Medicine

## 2019-08-19 ENCOUNTER — Ambulatory Visit: Payer: BC Managed Care – PPO | Attending: Family Medicine

## 2019-08-19 ENCOUNTER — Other Ambulatory Visit: Payer: Self-pay

## 2019-08-19 DIAGNOSIS — M25551 Pain in right hip: Secondary | ICD-10-CM | POA: Diagnosis not present

## 2019-08-19 DIAGNOSIS — M6281 Muscle weakness (generalized): Secondary | ICD-10-CM | POA: Diagnosis not present

## 2019-08-19 NOTE — Therapy (Signed)
[facility] [address] [place], [address] Phone: [phone]   Fax:  [phone]  Physical Therapy Treatment  Patient Details  Name: Nicole Owens MRN: [id number] Date of Birth: 04/12/1980 Referring Provider (PT): Lynne Leader, MD   Encounter Date: 08/19/2019  PT End of Session - 08/19/19 0849    Visit Number  26    Number of Visits  31    Date for PT Re-Evaluation  09/04/19    PT Start Time  0849    PT Stop Time  0934    PT Time Calculation (min)  45 min    Activity Tolerance  Patient tolerated treatment well    Behavior During Therapy  [facility] for tasks assessed/performed       Past Medical History:  Diagnosis Date  . Asthma   . Asthma, intermittent    mild  . Carpal tunnel syndrome on both sides    with pregnancy  . Female infertility 04/30/2010   history restored after error with record merge  . Heartburn in pregnancy    nausea  . 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 Postpartum care following cesarean delivery 02/17/2012  . Rh negative, maternal 02/17/2012  . S/P cesarean section - triplets 11/29 02/17/2012    Past Surgical History:  Procedure Laterality Date  . CESAREAN SECTION  02/16/2012   Procedure: CESAREAN SECTION;  Surgeon: Lovenia Kim, MD;  Location: [facility];  Service: Obstetrics;  Laterality: N/A;  . NO PAST SURGERIES    . WISDOM TOOTH EXTRACTION      There were no vitals filed for this visit.  Subjective Assessment - 08/19/19 0851    Subjective  R hip is ok. Hurt a little at the gym this morning but did core stuff. Backed off, but has more range.    Pertinent History  R hip pain. Pain began mid November 2020. Pt was at the gym. Pt was performing scissor kicks. Felt a little pain more than normal. Went for a backward lunge with R LE and felt sharp pain R lower abdomen. Took 10 days off and iced that area. Did not really get better. Was diagnosed with sports hernia. Still  getting pain but getting a little better. Could not jog before 6 weeks ago. Thinks she can jog slowly now. Using her R LE to stand up from a R half kneeling position bothers her. Pt goes to the gym 6 days a week before, no goes to the gym 1 time a week and performs modified upper body work out. Pivoting the wrong way sometimes would hurt. Going up the steps 2 at a time bothers her R hip. Getting out of bed involving SLR hip flexion movement would bother it. Coughing, sneezing would bother R hip. Had C section 7 years ago for triplets. The pain is along the line of the incision.    Currently in Pain?  No/denies    Pain Score  0-No pain                                PT Education - 08/19/19 0856    Education Details  ther-ex    Person(s) Educated  Patient    Methods  Explanation;Demonstration;Tactile cues;Verbal cues    Comprehension  Returned demonstration;Verbalized understanding      Objective   No latex allergies    MedbridgeAccess Code: [email or web address]  Pt wants to work up to being able to do planks, pushups, sit ups, spider and mountain climbers, jumps  Therapeutic exercise  [place] 32 ft to the R and 32 ft to the L 4x  High knees 32 ft x 6  Standing R hip adduction yellow band, 10x3 with 1-2 second holds, to the barrier of comfort level  Supine hip IR stretch   R 30 seconds x 3  L 30 second x 3   Supine hip at 90/90 hip IR   R 10x  L 10x3  Prone quad stretch 30 seconds x 3 R and L   Decreased R snapping hip symptoms after aforementioned exercises    Supine hip adductor stretch   R 30 seconds x 3  single leg bridge   R 10x3   S/L hip abduction   R 10x3 with yellow band resistance  L 10x3 with yellow band resistance   Seated hip adduction pillow squeeze 10x10 seconds for 2 sets   No snapping hip symptoms afterwards   Improved exercise technique, movement at target joints, use of target muscles aftermin tomod verbal,  visual, tactile cues.    Response to treatment Pt tolerated session well without aggravation of symptoms.     Clinical presentation Pt demonstrates snapping hip related symptoms for R side which decreased with supine hip IR and quadriceps stretch as well as hip adductor muscle strengthening. Continued with hip flexor and adductor muscle strengthening to help continue progress. Pt tolerated session well without aggravation of symptoms. Pt will benefit from continued skilled physical therapy services to decrease pain, improve strength and function.        PT Short Term Goals - 05/01/19 1149      PT SHORT TERM GOAL #1   Title  Patient will be independent with her HEP to decrease pain, improve strength and function.    Baseline  Pt has started her HEP (03/19/2019); Pt demonstrates independence with her HEP (05/01/2019)    Time  3    Period  Weeks    Status  Achieved    Target Date  04/10/19        PT Long Term Goals - 07/08/19 0855      PT LONG TERM GOAL #1   Title  Pt will have a decrease in R hip pain to 2/10 or less at worst to promote ability to run, perform exercises, stand up from a half kneeling position more comfortably.    Baseline  6/10 R hip pain at most (03/19/2019); 2-3/10 at most for the past 7 days (05/01/2019); 2-3/10 at most for the past 7 days (05/12/19), (05/26/2019); 3/10 (07/08/2019)    Time  8    Period  Weeks    Status  Partially Met    Target Date  09/04/19      PT LONG TERM GOAL #2   Title  Patient will improve R hip flexion, extension, abduction, and adduction strength by at least 1/2 MMT grade to promote ability to perform functional tasks more comfortably.    Baseline  improved by 1/2 MMT grade but pain with hip adduction (05/26/2019); maintained except hip extension (07/08/2019)    Time  8    Period  Weeks    Status  Partially Met    Target Date  09/04/19      PT LONG TERM GOAL #3   Title  Pt will be able to jog for at least 5 minutes  without R hip pain to promote ability  to participate in exercise and promote physical health    Baseline  increased R hip pain when jogging or running (03/19/2019); able to perform mini jog with short stride length (05/01/2019), (05/26/2019); Able to jog about 5 min except for this past Monday (07/08/2019)    Time  8    Period  Weeks    Status  Partially Met    Target Date  09/04/19            Plan - 08/19/19 0857    Clinical Impression Statement  Pt demonstrates snapping hip related symptoms for R side which decreased with supine hip IR and quadriceps stretch as well as hip adductor muscle strengthening. Continued with hip flexor and adductor muscle strengthening to help continue progress. Pt tolerated session well without aggravation of symptoms. Pt will benefit from continued skilled physical therapy services to decrease pain, improve strength and function.    Personal Factors and Comorbidities  Comorbidity 1    Comorbidities  hx of C-section    Examination-Activity Limitations  Squat    Stability/Clinical Decision Making  Stable/Uncomplicated    Rehab Potential  Good    PT Frequency  1x / week    PT Duration  8 weeks    PT Treatment/Interventions  Therapeutic activities;Therapeutic exercise;Neuromuscular re-education;Patient/family education;Manual techniques;Dry needling;Spinal Manipulations;Joint Manipulations;Aquatic Therapy;Electrical Stimulation;Iontophoresis 33m/ml Dexamethasone;Ultrasound    PT Next Visit Plan  isometric, then concentric, then eccentric hip and abdominal strengthening, manual techniques, modalities PRN    PT Home Exercise Plan  Medbridge Access Code: [email or web address]    Consulted and Agree with Plan of Care  Patient       Patient will benefit from skilled therapeutic intervention in order to improve the following deficits and impairments:  Pain, Improper body mechanics, Postural dysfunction, Decreased strength  Visit Diagnosis: Pain in right hip  Muscle weakness  (generalized)     Problem List Patient Active Problem List   Diagnosis Date Noted  . S/P cesarean section - triplets 11/29 02/17/2012  . Postpartum care following cesarean delivery (11/29) 02/17/2012  . Rh negative, maternal 02/17/2012  . 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 Routine general medical examination at a health care facility 06/23/2010  . FIBROCYSTIC BREAST DISEASE 07/17/2008  . Asthma, 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    Nicole BoersPT, DPT   08/19/2019, 11:00 AM  [facility] [address] [place] [address]Phone: [phone]  Fax:  [phone] Name: Nicole WOOLWORTHMRN: [id number] of Birth: [birth year]

## 2019-08-19 NOTE — Patient Instructions (Signed)
Access Code: [email or web address] URL: [email or web address] Date: 07/01/2019 Prepared by: Loralyn Freshwater  Exercises Seated Hip Flexion - 1 x daily - 7 x weekly - 10 reps - 3 sets Supine Bridge with Mini Swiss Ball Between Knees - 1 x daily - 7 x weekly - 10 reps - 3 sets - 5 seconds hold Bent Knee Fallouts - 1 x daily - 7 x weekly - 10 reps - 3 sets Single Leg Stance with Support - 3 x daily - 7 x weekly - 10 reps - 3 sets - 5 seconds hold Shoulder extension with resistance - Neutral - 1 x daily - 7 x weekly - 10 reps - 3 sets - 10 hold Straight Leg Raise - 1 x daily - 7 x weekly - 2 sets - 10 reps Prone Quadriceps Stretch with Strap - 3 x daily - 7 x weekly - 1 sets - 3 reps - 30 seconds hold    Also gave supine hip IR stretch 30 seconds x 3 each side as part of her HEP. Pt demonstrated and verbalized understanding.

## 2019-08-21 ENCOUNTER — Ambulatory Visit: Payer: BC Managed Care – PPO

## 2019-08-25 ENCOUNTER — Ambulatory Visit: Payer: BC Managed Care – PPO

## 2019-08-25 ENCOUNTER — Other Ambulatory Visit: Payer: Self-pay

## 2019-08-25 ENCOUNTER — Telehealth: Payer: Self-pay | Admitting: Internal Medicine

## 2019-08-25 DIAGNOSIS — M25551 Pain in right hip: Secondary | ICD-10-CM

## 2019-08-25 DIAGNOSIS — M6281 Muscle weakness (generalized): Secondary | ICD-10-CM

## 2019-08-25 MED ORDER — BUDESONIDE-FORMOTEROL FUMARATE 160-4.5 MCG/ACT IN AERO: 2.0000 | INHALATION_SPRAY | Freq: Two times a day (BID) | RESPIRATORY_TRACT | 1 refills | Status: DC

## 2019-08-25 MED ORDER — ALBUTEROL SULFATE HFA 108 (90 BASE) MCG/ACT IN AERS: INHALATION_SPRAY | RESPIRATORY_TRACT | 0 refills | Status: DC

## 2019-08-25 NOTE — Telephone Encounter (Signed)
Spoke with pt and scheduled f/u with Dr Marchelle Gearing 10/08/19 9:30. Refills sent in for Symbicort and Ventolin. Pt verbalized understanding. Nothing further needed.

## 2019-08-25 NOTE — Therapy (Signed)
[facility] [address], [place], [address] Phone: [phone]   Fax:  [phone]  Physical Therapy Treatment  Patient Details  Name: Nicole Owens MRN: [id number]  Date of Birth: February 21, 1981 Referring Provider (PT): Lynne Leader, MD   Encounter Date: 08/25/2019  PT End of Session - 08/25/19 0802    Visit Number  27    Number of Visits  31    Date for PT Re-Evaluation  09/04/19    PT Start Time  0802    PT Stop Time  0850    PT Time Calculation (min)  48 min    Activity Tolerance  Patient tolerated treatment well    Behavior During Therapy  [facility] for tasks assessed/performed       Past Medical History:  Diagnosis Date  . Asthma   . Asthma, intermittent    mild  . Carpal tunnel syndrome on both sides    with pregnancy  . Female infertility 04/30/2010   history restored after error with record merge  . Heartburn in pregnancy    nausea  . 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 Postpartum care following cesarean delivery 02/17/2012  . Rh negative, maternal 02/17/2012  . S/P cesarean section - triplets 11/29 02/17/2012    Past Surgical History:  Procedure Laterality Date  . CESAREAN SECTION  02/16/2012   Procedure: CESAREAN SECTION;  Surgeon: Lovenia Kim, MD;  Location: [facility];  Service: Obstetrics;  Laterality: N/A;  . NO PAST SURGERIES    . WISDOM TOOTH EXTRACTION      There were no vitals filed for this visit.  Subjective Assessment - 08/25/19 0804    Subjective  Did leg work outs this morning via Zoom. Did not hurt. No R hip pain currently. 2/10 at worst for the past 7 days. Very specific aggravating factors now. Got out of breath at times when performing the planks normally. Normal planks did not hurt, but now they're bother her R hip again. Backward lunges, pt is sitll heasitant on, performing broad jumps because of the landing (jumping far), tuck jumps.    Pertinent History  R hip  pain. Pain began mid November 2020. Pt was at the gym. Pt was performing scissor kicks. Felt a little pain more than normal. Went for a backward lunge with R LE and felt sharp pain R lower abdomen. Took 10 days off and iced that area. Did not really get better. Was diagnosed with sports hernia. Still getting pain but getting a little better. Could not jog before 6 weeks ago. Thinks she can jog slowly now. Using her R LE to stand up from a R half kneeling position bothers her. Pt goes to the gym 6 days a week before, no goes to the gym 1 time a week and performs modified upper body work out. Pivoting the wrong way sometimes would hurt. Going up the steps 2 at a time bothers her R hip. Getting out of bed involving SLR hip flexion movement would bother it. Coughing, sneezing would bother R hip. Had C section 7 years ago for triplets. The pain is along the line of the incision.    Currently in Pain?  No/denies    Pain Score  0-No pain                                PT Education - 08/25/19 0901  Education Details  ther-ex    Person(s) Educated  Patient    Methods  Explanation;Demonstration;Tactile cues;Verbal cues    Comprehension  Returned demonstration;Verbalized understanding       Objective   No latex allergies    MedbridgeAccess Code: [email or web address]    Pt wants to work up to being able to do planks, pushups, sit ups, spider and mountain climbers, jumps  Therapeutic exercise  Seated hip adduction pillow squeeze 10x10 seconds for 2 sets   Supine hip at 90/90 hip IR                         L 10x3   Prone quad stretch 30 seconds x 3 R and L   Prone glute max extension   R 10x, 5x  L 10x, 5x  Planks forward, regular 7 seconds, x 2.   No pain, no loss of breath.     S/L hip abduction              R 10x3 with yellow band resistance             L 10x3 with yellow band resistance   Standing R hip adduction yellow band, 10x3 with 1-2 second  holds, to the barrier of comfort level  Forward mini jumps 32 ft x2 (pt jumps about 3.5 ft each)  Then 4 ft jumps 4x 28 ft  Total Gym height 14 - Tuck jumps 10x2   No pain               Improved exercise technique, movement at target joints, use of target muscles aftermin tomod verbal, visual, tactile cues.    Response to treatment Pt tolerated session well without aggravation of symptoms.     Clinical presentation Continued working on improving quadriceps, hip IR, mobility as well as improving glute med, max, adductor and hip flexor strength, R hip flexor and adductor load tolerance. Pt currently able to perform regular forward planks at least 7 seconds, forward jumps 4 ft multiple times, and tuck jumps at Total gym, height 14 without complain of pain. Pt making very good progress with PT towards increased activity and load to affected muscles without pain. Pt tolerated session well without aggravation of symptoms. Pt will benefit from continued skilled physical therapy services to decrease pain, improve strength and function.      PT Short Term Goals - 05/01/19 1149      PT SHORT TERM GOAL #1   Title  Patient will be independent with her HEP to decrease pain, improve strength and function.    Baseline  Pt has started her HEP (03/19/2019); Pt demonstrates independence with her HEP (05/01/2019)    Time  3    Period  Weeks    Status  Achieved    Target Date  04/10/19        PT Long Term Goals - 07/08/19 0855      PT LONG TERM GOAL #1   Title  Pt will have a decrease in R hip pain to 2/10 or less at worst to promote ability to run, perform exercises, stand up from a half kneeling position more comfortably.    Baseline  6/10 R hip pain at most (03/19/2019); 2-3/10 at most for the past 7 days (05/01/2019); 2-3/10 at most for the past 7 days (05/12/19), (05/26/2019); 3/10 (07/08/2019)    Time  8    Period  Weeks    Status  Partially Met  Target Date  09/04/19       PT LONG TERM GOAL #2   Title  Patient will improve R hip flexion, extension, abduction, and adduction strength by at least 1/2 MMT grade to promote ability to perform functional tasks more comfortably.    Baseline  improved by 1/2 MMT grade but pain with hip adduction (05/26/2019); maintained except hip extension (07/08/2019)    Time  8    Period  Weeks    Status  Partially Met    Target Date  09/04/19      PT LONG TERM GOAL #3   Title  Pt will be able to jog for at least 5 minutes without R hip pain to promote ability to participate in exercise and promote physical health    Baseline  increased R hip pain when jogging or running (03/19/2019); able to perform mini jog with short stride length (05/01/2019), (05/26/2019); Able to jog about 5 min except for this past Monday (07/08/2019)    Time  8    Period  Weeks    Status  Partially Met    Target Date  09/04/19            Plan - 08/25/19 0900    Clinical Impression Statement  Continued working on improving quadriceps, hip IR, mobility as well as improving glute med, max, adductor and hip flexor strength, R hip flexor and adductor load tolerance. Pt currently able to perform regular forward planks at least 7 seconds, forward jumps 4 ft multiple times, and tuck jumps at Total gym, height 14 without complain of pain. Pt making very good progress with PT towards increased activity and load to affected muscles without pain. Pt tolerated session well without aggravation of symptoms. Pt will benefit from continued skilled physical therapy services to decrease pain, improve strength and function.    Personal Factors and Comorbidities  Comorbidity 1    Comorbidities  hx of C-section    Examination-Activity Limitations  Squat    Stability/Clinical Decision Making  Stable/Uncomplicated    Rehab Potential  Good    PT Frequency  1x / week    PT Duration  8 weeks    PT Treatment/Interventions  Therapeutic activities;Therapeutic exercise;Neuromuscular  re-education;Patient/family education;Manual techniques;Dry needling;Spinal Manipulations;Joint Manipulations;Aquatic Therapy;Electrical Stimulation;Iontophoresis 41m/ml Dexamethasone;Ultrasound    PT Next Visit Plan  isometric, then concentric, then eccentric hip and abdominal strengthening, manual techniques, modalities PRN    PT Home Exercise Plan  Medbridge Access Code: [email or web address]    Consulted and Agree with Plan of Care  Patient       Patient will benefit from skilled therapeutic intervention in order to improve the following deficits and impairments:  Pain, Improper body mechanics, Postural dysfunction, Decreased strength  Visit Diagnosis: Pain in right hip  Muscle weakness (generalized)     Problem List Patient Active Problem List   Diagnosis Date Noted  . S/P cesarean section - triplets 11/29 02/17/2012  . Postpartum care following cesarean delivery (11/29) 02/17/2012  . Rh negative, maternal 02/17/2012  . 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 Routine general medical examination at a health care facility 06/23/2010  . FIBROCYSTIC BREAST DISEASE 07/17/2008  . Asthma, 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    MJoneen BoersPT, DPT   08/25/2019, 9:03 AM  [facility] [address] [place] [id number]: [phone]  Fax:  [phone] Name: Nicole PIERROMRN: [id number] of Birth: [birth year]

## 2019-08-27 ENCOUNTER — Ambulatory Visit: Payer: BC Managed Care – PPO

## 2019-09-01 ENCOUNTER — Other Ambulatory Visit: Payer: Self-pay

## 2019-09-01 ENCOUNTER — Ambulatory Visit: Payer: BC Managed Care – PPO

## 2019-09-01 DIAGNOSIS — M6281 Muscle weakness (generalized): Secondary | ICD-10-CM | POA: Diagnosis not present

## 2019-09-01 DIAGNOSIS — M25551 Pain in right hip: Secondary | ICD-10-CM

## 2019-09-01 NOTE — Therapy (Signed)
[facility] [address], [place], [address] Phone: [phone]   Fax:  [phone]  Physical Therapy Treatment  Patient Details  Name: Nicole Owens MRN: [id number] Date of Birth: 15-Feb-1981 Referring Provider (PT): Lynne Leader, MD   Encounter Date: 09/01/2019   PT End of Session - 09/01/19 0803    Visit Number 28    Number of Visits 37    Date for PT Re-Evaluation 10/16/19    PT Start Time 0804    PT Stop Time 0846    PT Time Calculation (min) 42 min    Activity Tolerance Patient tolerated treatment well    Behavior During Therapy [facility] for tasks assessed/performed           Past Medical History:  Diagnosis Date  . Asthma   . Asthma, intermittent    mild  . Carpal tunnel syndrome on both sides    with pregnancy  . Female infertility 04/30/2010   history restored after error with record merge  . Heartburn in pregnancy    nausea  . 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 Postpartum care following cesarean delivery 02/17/2012  . Rh negative, maternal 02/17/2012  . S/P cesarean section - triplets 11/29 02/17/2012    Past Surgical History:  Procedure Laterality Date  . CESAREAN SECTION  02/16/2012   Procedure: CESAREAN SECTION;  Surgeon: Lovenia Kim, MD;  Location: [facility];  Service: Obstetrics;  Laterality: N/A;  . NO PAST SURGERIES    . WISDOM TOOTH EXTRACTION      There were no vitals filed for this visit.   Subjective Assessment - 09/01/19 0805    Subjective Did jump tucks last week which was good. Did about 4 in a row in a relay. Did not remember any pain. Did a lot of wide squats which bothered her with increased repetition. No R hip pain currently. Has not worked out yet this morning. Still has difficulty performing a straight leg raise.    Pertinent History R hip pain. Pain began mid November 2020. Pt was at the gym. Pt was performing scissor kicks. Felt a little pain more than  normal. Went for a backward lunge with R LE and felt sharp pain R lower abdomen. Took 10 days off and iced that area. Did not really get better. Was diagnosed with sports hernia. Still getting pain but getting a little better. Could not jog before 6 weeks ago. Thinks she can jog slowly now. Using her R LE to stand up from a R half kneeling position bothers her. Pt goes to the gym 6 days a week before, no goes to the gym 1 time a week and performs modified upper body work out. Pivoting the wrong way sometimes would hurt. Going up the steps 2 at a time bothers her R hip. Getting out of bed involving SLR hip flexion movement would bother it. Coughing, sneezing would bother R hip. Had C section 7 years ago for triplets. The pain is along the line of the incision.    Currently in Pain? No/denies              [facility] PT Assessment - 09/01/19 0838      Strength   Right Hip Flexion 5/5   seated hip flexion (not SLR hip flexion); no pain   Right Hip Extension 4+/5    Right Hip ABduction 4+/5    Right Hip ADduction 4/5   2/10 groin discomfort  Left Hip ABduction 4+/5    Left Hip ADduction 5/5                                 PT Education - 09/01/19 0827    Education Details ther-ex    Person(s) Educated Patient    Methods Explanation;Demonstration;Tactile cues;Verbal cues    Comprehension Returned demonstration;Verbalized understanding          Objective   No latex allergies    MedbridgeAccess Code: [email or web address]    Pt wants to work up to being able to do planks, pushups, sit ups, spider and mountain climbers, jumps  Therapeutic exercise  cariocas 32 ft to the R and 32 ft to the L 6x  Supine marches 10x2 each LE  1/2 SLR R hip flexion 5x, 3x. 3/10 at most. Able to lift R LE half way now.   Reviewed plan of care: 1x/week for 6 weeks  Supine hip at 90/90 hip IR  L 10x  Prone quad stretch 30 seconds x 3 R and L   Prone glute max  extension              R 10x2             L 10x2   S/L hip abduction  R 10x3 with yellow band resistance L 10x3 with yellow band resistance   Manually resisted S/L hip abduction, adduction, prone R hip extension, seated R hip flexion 1-2x each way for each LE  Reviewed progress/current status with strength with pt     Improved exercise technique, movement at target joints, use of target muscles aftermin tomod verbal, visual, tactile cues.    Response to treatment Pt tolerated session well without aggravation of symptoms.     Clinical presentation Improved R hip adduction, extension strength. Able to perform resisted seated R hip flexion manual muscle testing without pain. Pt also demonstrates overall decreased R hip pain and improved ability to perform more strenuous movements and activities involving R hip flexion, and adduction such as tuck jumps as well as improved ability to jog with less pain. Pt making progress with physical therapy towards goals. Challenges to progress include diastasis recti and chronicity of condition. Pt will benefit from continued skilled physical therapy services to decrease pain, improve strength and function.         PT Short Term Goals - 05/01/19 1149      PT SHORT TERM GOAL #1   Title Patient will be independent with her HEP to decrease pain, improve strength and function.    Baseline Pt has started her HEP (03/19/2019); Pt demonstrates independence with her HEP (05/01/2019)    Time 3    Period Weeks    Status Achieved    Target Date 04/10/19             PT Long Term Goals - 09/01/19 0834      PT LONG TERM GOAL #1   Title Pt will have a decrease in R hip pain to 2/10 or less at worst to promote ability to run, perform exercises, stand up from a half kneeling position more comfortably.    Baseline 6/10 R hip pain at most (03/19/2019); 2-3/10 at most for the past 7 days (05/01/2019); 2-3/10 at most  for the past 7 days (05/12/19), (05/26/2019); 3/10 (07/08/2019); 4/10 at most for the past 7 days (after workout at her gym and softball)  Time 6    Period Weeks    Status Partially Met    Target Date 10/16/19      PT LONG TERM GOAL #2   Title Patient will improve R hip flexion, extension, abduction, and adduction strength by at least 1/2 MMT grade to promote ability to perform functional tasks more comfortably.    Baseline improved by 1/2 MMT grade but pain with hip adduction (05/26/2019); maintained except hip extension (07/08/2019)    Time 6    Period Weeks    Status Partially Met    Target Date 10/16/19      PT LONG TERM GOAL #3   Title Pt will be able to jog for at least 5 minutes without R hip pain to promote ability to participate in exercise and promote physical health    Baseline increased R hip pain when jogging or running (03/19/2019); able to perform mini jog with short stride length (05/01/2019), (05/26/2019); Able to jog about 5 min except for this past Monday (07/08/2019); Pt states being able to jog, but does not know if it is painless (09/01/2019)    Time 6    Period Weeks    Status Partially Met    Target Date 10/16/19                 Plan - 09/01/19 2122    Clinical Impression Statement Improved R hip adduction, extension strength. Able to perform resisted seated R hip flexion manual muscle testing without pain. Pt also demonstrates overall decreased R hip pain and improved ability to perform more strenuous movements and activities involving R hip flexion, and adduction such as tuck jumps as well as improved ability to jog with less pain. Pt making progress with physical therapy towards goals. Challenges to progress include diastasis recti and chronicity of condition. Pt will benefit from continued skilled physical therapy services to decrease pain, improve strength and function.    Personal Factors and Comorbidities Comorbidity 1    Comorbidities hx of C-section     Examination-Activity Limitations Squat    Stability/Clinical Decision Making Stable/Uncomplicated    Clinical Decision Making Low    Rehab Potential Good    PT Frequency 1x / week    PT Duration 6 weeks    PT Treatment/Interventions Therapeutic activities;Therapeutic exercise;Neuromuscular re-education;Patient/family education;Manual techniques;Dry needling;Spinal Manipulations;Joint Manipulations;Aquatic Therapy;Electrical Stimulation;Iontophoresis 5m/ml Dexamethasone;Ultrasound    PT Next Visit Plan isometric, then concentric, then eccentric hip and abdominal strengthening, manual techniques, modalities PRN    PT Home Exercise Plan Medbridge Access Code: [email or web address]    Consulted and Agree with Plan of Care Patient           Patient will benefit from skilled therapeutic intervention in order to improve the following deficits and impairments:  Pain, Improper body mechanics, Postural dysfunction, Decreased strength  Visit Diagnosis: Pain in right hip - Plan: PT plan of care cert/re-cert  Muscle weakness (generalized) - Plan: PT plan of care cert/re-cert     Problem List Patient Active Problem List   Diagnosis Date Noted  . S/P cesarean section - triplets 11/29 02/17/2012  . Postpartum care following cesarean delivery (11/29) 02/17/2012  . Rh negative, maternal 02/17/2012  . 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 Routine general medical examination at a health care facility 06/23/2010  . FIBROCYSTIC BREAST DISEASE 07/17/2008  . Asthma, 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    MJoneen BoersPT, DPT   09/01/2019, 10:04 AM  [facility] [address] [place], [place], [address] Phone: [phone]   Fax:  [phone]  Name: Nicole Owens MRN: [id number] Date of Birth: 02/17/1981

## 2019-09-03 ENCOUNTER — Ambulatory Visit: Payer: BC Managed Care – PPO

## 2019-09-08 ENCOUNTER — Ambulatory Visit: Payer: BC Managed Care – PPO

## 2019-09-08 ENCOUNTER — Other Ambulatory Visit: Payer: Self-pay

## 2019-09-08 DIAGNOSIS — M25551 Pain in right hip: Secondary | ICD-10-CM

## 2019-09-08 DIAGNOSIS — M6281 Muscle weakness (generalized): Secondary | ICD-10-CM | POA: Diagnosis not present

## 2019-09-08 NOTE — Patient Instructions (Signed)
Access Code: [email or web address] URL: [email or web address] Date: 09/08/2019 Prepared by: Loralyn Freshwater  Exercises Seated Hip Flexion - 1 x daily - 7 x weekly - 10 reps - 3 sets Supine Bridge with Mini Swiss Ball Between Knees - 1 x daily - 7 x weekly - 10 reps - 3 sets - 5 seconds hold Bent Knee Fallouts - 1 x daily - 7 x weekly - 10 reps - 3 sets Single Leg Stance with Support - 3 x daily - 7 x weekly - 10 reps - 3 sets - 5 seconds hold Shoulder extension with resistance - Neutral - 1 x daily - 7 x weekly - 10 reps - 3 sets - 10 hold Straight Leg Raise - 1 x daily - 7 x weekly - 2 sets - 10 reps Prone Quadriceps Stretch with Strap - 3 x daily - 7 x weekly - 1 sets - 3 reps - 30 seconds hold Seated Hip Adduction Isometrics with Ball - 1 x daily - 7 x weekly - 3 sets - 10 reps - 10 seconds hold Supine Transversus Abdominis Bracing - Hands on Stomach - 3 x daily - 7 x weekly - 1 sets - 5 reps - 1 minute hold Bird Dog - 1 x daily - 7 x weekly - 2 sets - 10 reps

## 2019-09-08 NOTE — Therapy (Signed)
[facility] [address] [place], [address] Phone: [phone]   Fax:  [phone]  Physical Therapy Treatment  Patient Details  Name: Nicole Owens MRN: [id number] Date of Birth: 05/11/1980 Referring Provider (PT): Lynne Leader, MD   Encounter Date: 09/08/2019   PT End of Session - 09/08/19 0804    Visit Number 29    Number of Visits 37    Date for PT Re-Evaluation 10/16/19    PT Start Time 0804    PT Stop Time 0851    PT Time Calculation (min) 47 min    Activity Tolerance Patient tolerated treatment well    Behavior During Therapy [facility] for tasks assessed/performed           Past Medical History:  Diagnosis Date  . Asthma   . Asthma, intermittent    mild  . Carpal tunnel syndrome on both sides    with pregnancy  . Female infertility 04/30/2010   history restored after error with record merge  . Heartburn in pregnancy    nausea  . 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 Postpartum care following cesarean delivery 02/17/2012  . Rh negative, maternal 02/17/2012  . S/P cesarean section - triplets 11/29 02/17/2012    Past Surgical History:  Procedure Laterality Date  . CESAREAN SECTION  02/16/2012   Procedure: CESAREAN SECTION;  Surgeon: Lovenia Kim, MD;  Location: [facility];  Service: Obstetrics;  Laterality: Nicole/A;  . NO PAST SURGERIES    . WISDOM TOOTH EXTRACTION      There were no vitals filed for this visit.   Subjective Assessment - 09/08/19 0808    Subjective R hip is sore but pt was able to run a mile at a 12 minute pace last week, Tuesday after doing 30 minutes of [employer] (athletic conditioning). Was pretty sore. Currently having the same level of soreness. Back to working out 6 days a week.  No pain currently. Sore because she ran/walked 2 miles (about 13 min mile pace/run walk). 4/10 R hip soreness currently.    Pertinent History R hip pain. Pain began mid November 2020. Pt  was at the gym. Pt was performing scissor kicks. Felt a little pain more than normal. Went for a backward lunge with R LE and felt sharp pain R lower abdomen. Took 10 days off and iced that area. Did not really get better. Was diagnosed with sports hernia. Still getting pain but getting a little better. Could not jog before 6 weeks ago. Thinks she can jog slowly now. Using her R LE to stand up from a R half kneeling position bothers her. Pt goes to the gym 6 days a week before, no goes to the gym 1 time a week and performs modified upper body work out. Pivoting the wrong way sometimes would hurt. Going up the steps 2 at a time bothers her R hip. Getting out of bed involving SLR hip flexion movement would bother it. Coughing, sneezing would bother R hip. Had C section 7 years ago for triplets. The pain is along the line of the incision.    Currently in Pain? Yes    Pain Score 4     Pain Location Hip    Pain Orientation Right  PT Education - 09/08/19 0807    Education Details ther-ex, HEP    Person(s) Educated Patient    Methods Explanation;Demonstration;Tactile cues;Verbal cues;Handout    Comprehension Returned demonstration;Verbalized understanding          Objective   No latex allergies   Has a 10k obstacle course race end of July 2021   MedbridgeAccess Code: [email or web address]    Pt wants to work up to being able to do planks, pushups, sit ups, spider and mountain climbers, jumps  Therapeutic exercise  Seated R hip flexion isometrics at pain free level of effort 1 minute x 5     Supine transversus abdominis contraction 1 minute x 3   Then with hip fallouts 10x3 each LE  Quadruped alternate hip extension contralateral shoulder flexion 10x2 each UE   Supine hamstring stretch   R 30 seconds x 3   Supine R hip IR stretch 30 seconds x 3   Standing R Tensor fascia latae (TFL) stretch 30 seconds x 3  R S/L R hip  adduction 10x3     Improved exercise technique, movement at target joints, use of target muscles aftermin tomod verbal, visual, tactile cues.    Response to treatment Good muscle use felt with exercises.  Pt tolerated session well without aggravation of symptoms.      Clinical presentation Worked on isometric R hip flexion pain free level of effort to help promote gentle hip flexor activation and decrease soreness. Worked on improving transversus abdominis activation and strength to promote lumbopelvic control. Good muscle use observed with exercises. Pt tolerated session well without aggravation of symptoms. Pt will benefit from continued skilled physical therapy services to decrease pain, improve strength and function.        PT Short Term Goals - 05/01/19 1149      PT SHORT TERM GOAL #1   Title Patient will be independent with her HEP to decrease pain, improve strength and function.    Baseline Pt has started her HEP (03/19/2019); Pt demonstrates independence with her HEP (05/01/2019)    Time 3    Period Weeks    Status Achieved    Target Date 04/10/19             PT Long Term Goals - 09/01/19 0834      PT LONG TERM GOAL #1   Title Pt will have a decrease in R hip pain to 2/10 or less at worst to promote ability to run, perform exercises, stand up from a half kneeling position more comfortably.    Baseline 6/10 R hip pain at most (03/19/2019); 2-3/10 at most for the past 7 days (05/01/2019); 2-3/10 at most for the past 7 days (05/12/19), (05/26/2019); 3/10 (07/08/2019); 4/10 at most for the past 7 days (after workout at her gym and softball)    Time 6    Period Weeks    Status Partially Met    Target Date 10/16/19      PT LONG TERM GOAL #2   Title Patient will improve R hip flexion, extension, abduction, and adduction strength by at least 1/2 MMT grade to promote ability to perform functional tasks more comfortably.    Baseline improved by 1/2 MMT grade  but pain with hip adduction (05/26/2019); maintained except hip extension (07/08/2019)    Time 6    Period Weeks    Status Partially Met    Target Date 10/16/19      PT LONG TERM GOAL #3   Title Pt  will be able to jog for at least 5 minutes without R hip pain to promote ability to participate in exercise and promote physical health    Baseline increased R hip pain when jogging or running (03/19/2019); able to perform mini jog with short stride length (05/01/2019), (05/26/2019); Able to jog about 5 min except for this past Monday (07/08/2019); Pt states being able to jog, but does not know if it is painless (09/01/2019)    Time 6    Period Weeks    Status Partially Met    Target Date 10/16/19                 Plan - 09/08/19 0806    Clinical Impression Statement Worked on isometric R hip flexion pain free level of effort to help promote gentle hip flexor activation and decrease soreness. Worked on improving transversus abdominis activation and strength to promote lumbopelvic control. Good muscle use observed with exercises. Pt tolerated session well without aggravation of symptoms. Pt will benefit from continued skilled physical therapy services to decrease pain, improve strength and function.    Personal Factors and Comorbidities Comorbidity 1    Comorbidities hx of C-section    Examination-Activity Limitations Squat    Stability/Clinical Decision Making Stable/Uncomplicated    Rehab Potential Good    PT Frequency 1x / week    PT Duration 6 weeks    PT Treatment/Interventions Therapeutic activities;Therapeutic exercise;Neuromuscular re-education;Patient/family education;Manual techniques;Dry needling;Spinal Manipulations;Joint Manipulations;Aquatic Therapy;Electrical Stimulation;Iontophoresis 63m/ml Dexamethasone;Ultrasound    PT Next Visit Plan isometric, then concentric, then eccentric hip and abdominal strengthening, manual techniques, modalities PRN    PT Home Exercise Plan Medbridge  Access Code: [email or web address]    Consulted and Agree with Plan of Care Patient           Patient will benefit from skilled therapeutic intervention in order to improve the following deficits and impairments:  Pain, Improper body mechanics, Postural dysfunction, Decreased strength  Visit Diagnosis: Pain in right hip  Muscle weakness (generalized)     Problem List Patient Active Problem List   Diagnosis Date Noted  . S/P cesarean section - triplets 11/29 02/17/2012  . Postpartum care following cesarean delivery (11/29) 02/17/2012  . Rh negative, maternal 02/17/2012  . 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 Routine general medical examination at a health care facility 06/23/2010  . FIBROCYSTIC BREAST DISEASE 07/17/2008  . Asthma, 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    MJoneen BoersPT, DPT   09/08/2019, 9:00 AM  [facility] [address] [place] [address]Phone: [phone]  Fax:  [phone] Name: NMILDA LINDVALLMRN: [id number] of Birth: [birth year]

## 2019-09-15 ENCOUNTER — Ambulatory Visit: Payer: BC Managed Care – PPO

## 2019-09-15 ENCOUNTER — Other Ambulatory Visit: Payer: Self-pay

## 2019-09-15 DIAGNOSIS — M6281 Muscle weakness (generalized): Secondary | ICD-10-CM | POA: Diagnosis not present

## 2019-09-15 DIAGNOSIS — M25551 Pain in right hip: Secondary | ICD-10-CM

## 2019-09-15 NOTE — Therapy (Signed)
[facility] [address] [place], [address] Phone: [phone]   Fax:  [phone]  Physical Therapy Treatment  Patient Details  Name: Nicole Owens MRN: [id number] Date of Birth: 05/18/1980 Referring Provider (PT): Lynne Leader, MD   Encounter Date: 09/15/2019   PT End of Session - 09/15/19 0807    Visit Number 30    Number of Visits 37    Date for PT Re-Evaluation 10/16/19    PT Start Time 0807    PT Stop Time 0847    PT Time Calculation (min) 40 min    Activity Tolerance Patient tolerated treatment well    Behavior During Therapy [facility] for tasks assessed/performed           Past Medical History:  Diagnosis Date  . Asthma   . Asthma, intermittent    mild  . Carpal tunnel syndrome on both sides    with pregnancy  . Female infertility 04/30/2010   history restored after error with record merge  . Heartburn in pregnancy    nausea  . 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 Postpartum care following cesarean delivery 02/17/2012  . Rh negative, maternal 02/17/2012  . S/P cesarean section - triplets 11/29 02/17/2012    Past Surgical History:  Procedure Laterality Date  . CESAREAN SECTION  02/16/2012   Procedure: CESAREAN SECTION;  Surgeon: Lovenia Kim, MD;  Location: [facility];  Service: Obstetrics;  Laterality: N/A;  . NO PAST SURGERIES    . WISDOM TOOTH EXTRACTION      There were no vitals filed for this visit.   Subjective Assessment - 09/15/19 0808    Subjective R hip is in pain. 5-6/10 currently. Has been happening gradually all week. Had softball playoffs. Thinks that the gym, and soft ball might have overworked it. Not a lot of pain during the activities.    Pertinent History R hip pain. Pain began mid November 2020. Pt was at the gym. Pt was performing scissor kicks. Felt a little pain more than normal. Went for a backward lunge with R LE and felt sharp pain R lower abdomen. Took 10  days off and iced that area. Did not really get better. Was diagnosed with sports hernia. Still getting pain but getting a little better. Could not jog before 6 weeks ago. Thinks she can jog slowly now. Using her R LE to stand up from a R half kneeling position bothers her. Pt goes to the gym 6 days a week before, no goes to the gym 1 time a week and performs modified upper body work out. Pivoting the wrong way sometimes would hurt. Going up the steps 2 at a time bothers her R hip. Getting out of bed involving SLR hip flexion movement would bother it. Coughing, sneezing would bother R hip. Had C section 7 years ago for triplets. The pain is along the line of the incision.    Currently in Pain? Yes    Pain Score 6    5-6/10                                    PT Education - 09/15/19 0816    Education Details ther-ex    Person(s) Educated Patient    Methods Explanation;Demonstration;Tactile cues;Verbal cues    Comprehension Verbalized understanding;Returned demonstration          Objective  No latex allergies   Has a 10k obstacle course race end of July 2021   MedbridgeAccess Code: [email or web address]    Pt wants to work up to being able to do planks, pushups, sit ups, spider and mountain climbers, jumps  Therapeutic exercise  Kneeling plank row (pt usual workout) with R UE: unable to perform secondary to core weakness.   Quadruped R UE row: able to perform.   Supine SLR R hip flexion: unable to lift greater than 1 inch, no pain  Supine posterior pelvic tilt 10x10 seconds for 2 sets  Supine hamstring stretch              R 30 seconds x 3  Supine hip adduction ball squeeze with posterior pelvic tilt 10x10 seconds    Supine R hip IR stretch 30 seconds x 3              Supine transversus abdominis contraction with hip fallouts 10x3 each LE  Supine marches 10x3 each LE with transversus abdominis contraction.   Bridge with increased knee flexion  and bilateral ankle DF and transversus abdominis activation 10x3 to increase glute max strength  Supine R SLR hip flexion with PT assist 10x with transversus abdominis muscle activation.     Improved exercise technique, movement at target joints, use of target muscles aftermin tomod verbal, visual, tactile cues.    Response to treatment Good muscle use felt with exercises.  Pt tolerated session well without aggravation of symptoms.      Clinical presentation Focused on gentle R hip flexor and adductor activation with transversus abdominis muscle use to promote proper stress to affected areas for healing and for pain control. Good muscle use felt with exercises. No R hip pain at rest after session in sitting and in walking (pt had pain with walking at start of session). Pt will benefit from continued skilled physical therapy services to decrease pain, improve strength, healing and function.     PT Short Term Goals - 05/01/19 1149      PT SHORT TERM GOAL #1   Title Patient will be independent with her HEP to decrease pain, improve strength and function.    Baseline Pt has started her HEP (03/19/2019); Pt demonstrates independence with her HEP (05/01/2019)    Time 3    Period Weeks    Status Achieved    Target Date 04/10/19             PT Long Term Goals - 09/01/19 0834      PT LONG TERM GOAL #1   Title Pt will have a decrease in R hip pain to 2/10 or less at worst to promote ability to run, perform exercises, stand up from a half kneeling position more comfortably.    Baseline 6/10 R hip pain at most (03/19/2019); 2-3/10 at most for the past 7 days (05/01/2019); 2-3/10 at most for the past 7 days (05/12/19), (05/26/2019); 3/10 (07/08/2019); 4/10 at most for the past 7 days (after workout at her gym and softball)    Time 6    Period Weeks    Status Partially Met    Target Date 10/16/19      PT LONG TERM GOAL #2   Title Patient will improve R hip flexion,  extension, abduction, and adduction strength by at least 1/2 MMT grade to promote ability to perform functional tasks more comfortably.    Baseline improved by 1/2 MMT grade but pain with hip adduction (05/26/2019); maintained except hip  extension (07/08/2019)    Time 6    Period Weeks    Status Partially Met    Target Date 10/16/19      PT LONG TERM GOAL #3   Title Pt will be able to jog for at least 5 minutes without R hip pain to promote ability to participate in exercise and promote physical health    Baseline increased R hip pain when jogging or running (03/19/2019); able to perform mini jog with short stride length (05/01/2019), (05/26/2019); Able to jog about 5 min except for this past Monday (07/08/2019); Pt states being able to jog, but does not know if it is painless (09/01/2019)    Time 6    Period Weeks    Status Partially Met    Target Date 10/16/19                 Plan - 09/15/19 0817    Clinical Impression Statement Focused on gentle R hip flexor and adductor activation with transversus abdominis muscle use to promote proper stress to affected areas for healing and for pain control. Good muscle use felt with exercises. No R hip pain at rest after session in sitting and in walking (pt had pain with walking at start of session). Pt will benefit from continued skilled physical therapy services to decrease pain, improve strength, healing and function.    Personal Factors and Comorbidities Comorbidity 1    Comorbidities hx of C-section    Examination-Activity Limitations Squat    Stability/Clinical Decision Making Stable/Uncomplicated    Rehab Potential Good    PT Frequency 1x / week    PT Duration 6 weeks    PT Treatment/Interventions Therapeutic activities;Therapeutic exercise;Neuromuscular re-education;Patient/family education;Manual techniques;Dry needling;Spinal Manipulations;Joint Manipulations;Aquatic Therapy;Electrical Stimulation;Iontophoresis 42m/ml  Dexamethasone;Ultrasound    PT Next Visit Plan isometric, then concentric, then eccentric hip and abdominal strengthening, manual techniques, modalities PRN    PT Home Exercise Plan Medbridge Access Code: [email or web address]    Consulted and Agree with Plan of Care Patient           Patient will benefit from skilled therapeutic intervention in order to improve the following deficits and impairments:  Pain, Improper body mechanics, Postural dysfunction, Decreased strength  Visit Diagnosis: Pain in right hip  Muscle weakness (generalized)     Problem List Patient Active Problem List   Diagnosis Date Noted  . S/P cesarean section - triplets 11/29 02/17/2012  . Postpartum care following cesarean delivery (11/29) 02/17/2012  . Rh negative, maternal 02/17/2012  . 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 Routine general medical examination at a health care facility 06/23/2010  . FIBROCYSTIC BREAST DISEASE 07/17/2008  . Asthma, 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    MJoneen BoersPT, DPT   09/15/2019, 8:56 AM  [facility] [address] [place] [address]Phone: [phone]  Fax:  [phone] Name: Nicole HOHLERMRN: [id number] of Birth: 9November 05, 1982

## 2019-09-24 ENCOUNTER — Ambulatory Visit: Payer: BC Managed Care – PPO | Attending: Family Medicine

## 2019-09-24 ENCOUNTER — Other Ambulatory Visit: Payer: Self-pay

## 2019-09-24 DIAGNOSIS — M25551 Pain in right hip: Secondary | ICD-10-CM

## 2019-09-24 DIAGNOSIS — M6281 Muscle weakness (generalized): Secondary | ICD-10-CM | POA: Diagnosis not present

## 2019-09-24 NOTE — Patient Instructions (Signed)
Access Code: [email or web address] URL: [email or web address] Date: 09/24/2019 Prepared by: Loralyn Freshwater  Exercises Seated Hip Flexion - 1 x daily - 7 x weekly - 10 reps - 3 sets Supine Bridge with Mini Swiss Ball Between Knees - 1 x daily - 7 x weekly - 10 reps - 3 sets - 5 seconds hold Bent Knee Fallouts - 1 x daily - 7 x weekly - 10 reps - 3 sets Single Leg Stance with Support - 3 x daily - 7 x weekly - 10 reps - 3 sets - 5 seconds hold Shoulder extension with resistance - Neutral - 1 x daily - 7 x weekly - 10 reps - 3 sets - 10 hold Straight Leg Raise - 1 x daily - 7 x weekly - 2 sets - 10 reps Prone Quadriceps Stretch with Strap - 3 x daily - 7 x weekly - 1 sets - 3 reps - 30 seconds hold Seated Hip Adduction Isometrics with Ball - 1 x daily - 7 x weekly - 3 sets - 10 reps - 10 seconds hold Supine Transversus Abdominis Bracing - Hands on Stomach - 3 x daily - 7 x weekly - 1 sets - 5 reps - 1 minute hold Bird Dog - 1 x daily - 7 x weekly - 2 sets - 10 reps Supine Transversus Abdominis Bracing with Heel Slide - 1 x daily - 7 x weekly - 2 sets - 10 reps

## 2019-09-24 NOTE — Therapy (Signed)
[facility] [address] [place], [address] Phone: [phone]   Fax:  [phone]  Physical Therapy Treatment  Patient Details  Name: Nicole Owens MRN: [id number] Date of Birth: 1981/02/17 Referring Provider (PT): Lynne Leader, MD   Encounter Date: 09/24/2019   PT End of Session - 09/24/19 0805    Visit Number 31    Number of Visits 37    Date for PT Re-Evaluation 10/16/19    PT Start Time 0805    PT Stop Time 5638    PT Time Calculation (min) 51 min    Activity Tolerance Patient tolerated treatment well    Behavior During Therapy [facility] for tasks assessed/performed           Past Medical History:  Diagnosis Date   Asthma    Asthma, intermittent    mild   Carpal tunnel syndrome on both sides    with pregnancy   Female infertility 04/30/2010   history restored after error with record merge   Heartburn in pregnancy    nausea   Multiple gestation - triplets 08/10/2011   Postpartum care following cesarean delivery 02/17/2012   Rh negative, maternal 02/17/2012   S/P cesarean section - triplets 11/29 02/17/2012    Past Surgical History:  Procedure Laterality Date   CESAREAN SECTION  02/16/2012   Procedure: CESAREAN SECTION;  Surgeon: Lovenia Kim, MD;  Location: [facility];  Service: Obstetrics;  Laterality: N/A;   NO PAST SURGERIES     WISDOM TOOTH EXTRACTION      There were no vitals filed for this visit.   Subjective Assessment - 09/24/19 0806    Subjective R hip is overall probably around a low 3/10. Better than last week because she took it easy. Bothering her again when she takes a step onto a step with it onto a step.    Pertinent History R hip pain. Pain began mid November 2020. Pt was at the gym. Pt was performing scissor kicks. Felt a little pain more than normal. Went for a backward lunge with R LE and felt sharp pain R lower abdomen. Took 10 days off and iced that area. Did  not really get better. Was diagnosed with sports hernia. Still getting pain but getting a little better. Could not jog before 6 weeks ago. Thinks she can jog slowly now. Using her R LE to stand up from a R half kneeling position bothers her. Pt goes to the gym 6 days a week before, no goes to the gym 1 time a week and performs modified upper body work out. Pivoting the wrong way sometimes would hurt. Going up the steps 2 at a time bothers her R hip. Getting out of bed involving SLR hip flexion movement would bother it. Coughing, sneezing would bother R hip. Had C section 7 years ago for triplets. The pain is along the line of the incision.    Currently in Pain? Yes    Pain Score 3                                      PT Education - 09/24/19 0824    Education Details ther-ex    Person(s) Educated Patient    Methods Explanation;Demonstration;Tactile cues;Verbal cues    Comprehension Returned demonstration;Verbalized understanding          Objective   No  latex allergies   Has a 10k obstacle course race end of July 2021   MedbridgeAccess Code: [email or web address]    Pt wants to work up to being able to do planks, pushups, sit ups, spider and mountain climbers, jumps  Therapeutic exercise   Supine posterior pelvic tilt with transversus abdominis contraction  Marches 10x3 each LE  Then with heel slides without heels touching table. 10x2 each LE  Supine R SLR hip flexion with PT assist 10x with transversus abdominis muscle activation.   R S/L R hip adduction 10x3  Standing R hip adduction yellow band 10x3  SLS  R LE 1 minute  Then with 3 kg ball toss 20x2  Standing leg swings  Abduction/adduction 20x2  Flexion/extension 20x2  Supine hamstring stretch  R 30 seconds x 3  Prone quad stretch R LE 1 minute x 3  Pt education on holding off planks and strenuous activities such as softball to allow for healing and muscle recovery. Pt  verbalized understanding.    Improved exercise technique, movement at target joints, use of target muscles aftermin tomod verbal, visual, tactile cues.    Response to treatment Good muscle use felt with exercises. Pt tolerated session well without aggravation of symptoms.      Clinical presentation Decreased R hip pain compared to previous session. Continued working on transversus abdominis strengthening to promote lumbopelvic control as well as applying proper stress to affected tissues to promote healing. R hip recovery seemed to be doing well until pt added soft ball to her routine in addition to her gym workouts. Pt was recommended to hold off strenuous activities to allow for healing and muscle recovery. Pt verbalized understanding. Pt tolerated session well without aggravation of symptoms. Pt will benefit from continued skilled physical therapy services to decrease pain, improve strength and function.           PT Short Term Goals - 05/01/19 1149      PT SHORT TERM GOAL #1   Title Patient will be independent with her HEP to decrease pain, improve strength and function.    Baseline Pt has started her HEP (03/19/2019); Pt demonstrates independence with her HEP (05/01/2019)    Time 3    Period Weeks    Status Achieved    Target Date 04/10/19             PT Long Term Goals - 09/01/19 0834      PT LONG TERM GOAL #1   Title Pt will have a decrease in R hip pain to 2/10 or less at worst to promote ability to run, perform exercises, stand up from a half kneeling position more comfortably.    Baseline 6/10 R hip pain at most (03/19/2019); 2-3/10 at most for the past 7 days (05/01/2019); 2-3/10 at most for the past 7 days (05/12/19), (05/26/2019); 3/10 (07/08/2019); 4/10 at most for the past 7 days (after workout at her gym and softball)    Time 6    Period Weeks    Status Partially Met    Target Date 10/16/19      PT LONG TERM GOAL #2   Title Patient will  improve R hip flexion, extension, abduction, and adduction strength by at least 1/2 MMT grade to promote ability to perform functional tasks more comfortably.    Baseline improved by 1/2 MMT grade but pain with hip adduction (05/26/2019); maintained except hip extension (07/08/2019)    Time 6    Period Weeks    Status  Partially Met    Target Date 10/16/19      PT LONG TERM GOAL #3   Title Pt will be able to jog for at least 5 minutes without R hip pain to promote ability to participate in exercise and promote physical health    Baseline increased R hip pain when jogging or running (03/19/2019); able to perform mini jog with short stride length (05/01/2019), (05/26/2019); Able to jog about 5 min except for this past Monday (07/08/2019); Pt states being able to jog, but does not know if it is painless (09/01/2019)    Time 6    Period Weeks    Status Partially Met    Target Date 10/16/19                 Plan - 09/24/19 8264    Clinical Impression Statement Decreased R hip pain compared to previous session. Continued working on transversus abdominis strengthening to promote lumbopelvic control as well as applying proper stress to affected tissues to promote healing. R hip recovery seemed to be doing well until pt added soft ball to her routine in addition to her gym workouts. Pt was recommended to hold off strenuous activities to allow for healing and muscle recovery. Pt verbalized understanding. Pt tolerated session well without aggravation of symptoms. Pt will benefit from continued skilled physical therapy services to decrease pain, improve strength and function.    Personal Factors and Comorbidities Comorbidity 1    Comorbidities hx of C-section    Examination-Activity Limitations Squat    Stability/Clinical Decision Making Stable/Uncomplicated    Rehab Potential Good    PT Frequency 1x / week    PT Duration 6 weeks    PT Treatment/Interventions Therapeutic activities;Therapeutic  exercise;Neuromuscular re-education;Patient/family education;Manual techniques;Dry needling;Spinal Manipulations;Joint Manipulations;Aquatic Therapy;Electrical Stimulation;Iontophoresis 41m/ml Dexamethasone;Ultrasound    PT Next Visit Plan isometric, then concentric, then eccentric hip and abdominal strengthening, manual techniques, modalities PRN    PT Home Exercise Plan Medbridge Access Code: [email or web address]    Consulted and Agree with Plan of Care Patient           Patient will benefit from skilled therapeutic intervention in order to improve the following deficits and impairments:  Pain, Improper body mechanics, Postural dysfunction, Decreased strength  Visit Diagnosis: Pain in right hip  Muscle weakness (generalized)     Problem List Patient Active Problem List   Diagnosis Date Noted   S/P cesarean section - triplets 11/29 02/17/2012   Postpartum care following cesarean delivery (11/29) 02/17/2012   Rh negative, maternal 02/17/2012   Multiple gestation - triplets 08/10/2011   Routine general medical examination at a health care facility 06/23/2010   FIBROCYSTIC BREAST DISEASE 07/17/2008   Asthma, 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     MJoneen BoersPT, DPT    09/24/2019, 9:17 AM  [facility] [address] [place] [address]Phone: [phone]  Fax:  [phone] Name: Nicole BASHERMRN: [id number] of Birth: [birth year]

## 2019-09-29 ENCOUNTER — Other Ambulatory Visit: Payer: Self-pay

## 2019-09-29 ENCOUNTER — Ambulatory Visit: Payer: BC Managed Care – PPO

## 2019-09-29 DIAGNOSIS — M25551 Pain in right hip: Secondary | ICD-10-CM | POA: Diagnosis not present

## 2019-09-29 DIAGNOSIS — M6281 Muscle weakness (generalized): Secondary | ICD-10-CM | POA: Diagnosis not present

## 2019-09-29 NOTE — Therapy (Signed)
[facility] [address], [place], [address] Phone: [phone]   Fax:  [phone]  Physical Therapy Treatment  Patient Details  Name: Nicole Owens MRN: [id number] Date of Birth: 07/02/1980 Referring Provider (PT): Lynne Leader, MD   Encounter Date: 09/29/2019   PT End of Session - 09/29/19 0804    Visit Number 32    Number of Visits 37    Date for PT Re-Evaluation 10/16/19    PT Start Time 0804    PT Stop Time 9728    PT Time Calculation (min) 43 min    Activity Tolerance Patient tolerated treatment well    Behavior During Therapy [facility] for tasks assessed/performed           Past Medical History:  Diagnosis Date  . Asthma   . Asthma, intermittent    mild  . Carpal tunnel syndrome on both sides    with pregnancy  . Female infertility 04/30/2010   history restored after error with record merge  . Heartburn in pregnancy    nausea  . 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 Postpartum care following cesarean delivery 02/17/2012  . Rh negative, maternal 02/17/2012  . S/P cesarean section - triplets 11/29 02/17/2012    Past Surgical History:  Procedure Laterality Date  . CESAREAN SECTION  02/16/2012   Procedure: CESAREAN SECTION;  Surgeon: Lovenia Kim, MD;  Location: [facility];  Service: Obstetrics;  Laterality: N/A;  . NO PAST SURGERIES    . WISDOM TOOTH EXTRACTION      There were no vitals filed for this visit.   Subjective Assessment - 09/29/19 0805    Subjective R hip feels ok. R hip is about the same since last session. Walked the [employer] duing a baseball game up and down ramps. Aggravated her R hip.    Pertinent History R hip pain. Pain began mid November 2020. Pt was at the gym. Pt was performing scissor kicks. Felt a little pain more than normal. Went for a backward lunge with R LE and felt sharp pain R lower abdomen. Took 10 days off and iced that area. Did not really get  better. Was diagnosed with sports hernia. Still getting pain but getting a little better. Could not jog before 6 weeks ago. Thinks she can jog slowly now. Using her R LE to stand up from a R half kneeling position bothers her. Pt goes to the gym 6 days a week before, no goes to the gym 1 time a week and performs modified upper body work out. Pivoting the wrong way sometimes would hurt. Going up the steps 2 at a time bothers her R hip. Getting out of bed involving SLR hip flexion movement would bother it. Coughing, sneezing would bother R hip. Had C section 7 years ago for triplets. The pain is along the line of the incision.    Currently in Pain? No/denies    Pain Score 0-No pain                                     PT Education - 09/29/19 0809    Education Details ther-ex    Person(s) Educated Patient    Methods Explanation;Demonstration;Tactile cues;Verbal cues    Comprehension Returned demonstration;Verbalized understanding          Objective   No latex allergies  Has a 10k obstacle course race end of July 2021   MedbridgeAccess Code: [email or web address]    Pt wants to work up to being able to do planks, pushups, sit ups, spider and mountain climbers, jumps  Therapeutic exercise  Supine posterior pelvic tilt with transversus abdominis contraction             Marches 10x3 each LE  Then with alternate knee extension 5x2 each LE             Then with heel slides without heels touching table. 10x, then 5x2 each LE  Prone quad stretch R LE 1 minute x 3  Supine hamstring stretch  R 30 seconds x 3  Supine R SLR hip flexion with PT assist 10x with transversus abdominis muscle activation.  R S/L R hip adduction 10x  Standing R hip adduction yellow band 10x3  SLS             R LE 1 minute               Improved exercise technique, movement at target joints, use of target muscles aftermin tomod verbal, visual, tactile  cues.    Response to treatment Good muscle use felt with exercises.    Clinical presentation Decreased overall R hip pain compared to 2 weeks ago based on subjective pain level reports. Continued working on improving transversus abdominis contraction, as well as improving hip flexor and hip adductor muscle strength while promoting proper stress to hip flexor tendon, proximal hip adductor and distal abdominal muscle insertions to promote healing. Pt will benefit from continued skilled physical therapy services to decrease pain, improve strength and function.         PT Short Term Goals - 05/01/19 1149      PT SHORT TERM GOAL #1   Title Patient will be independent with her HEP to decrease pain, improve strength and function.    Baseline Pt has started her HEP (03/19/2019); Pt demonstrates independence with her HEP (05/01/2019)    Time 3    Period Weeks    Status Achieved    Target Date 04/10/19             PT Long Term Goals - 09/01/19 0834      PT LONG TERM GOAL #1   Title Pt will have a decrease in R hip pain to 2/10 or less at worst to promote ability to run, perform exercises, stand up from a half kneeling position more comfortably.    Baseline 6/10 R hip pain at most (03/19/2019); 2-3/10 at most for the past 7 days (05/01/2019); 2-3/10 at most for the past 7 days (05/12/19), (05/26/2019); 3/10 (07/08/2019); 4/10 at most for the past 7 days (after workout at her gym and softball)    Time 6    Period Weeks    Status Partially Met    Target Date 10/16/19      PT LONG TERM GOAL #2   Title Patient will improve R hip flexion, extension, abduction, and adduction strength by at least 1/2 MMT grade to promote ability to perform functional tasks more comfortably.    Baseline improved by 1/2 MMT grade but pain with hip adduction (05/26/2019); maintained except hip extension (07/08/2019)    Time 6    Period Weeks    Status Partially Met    Target Date 10/16/19      PT LONG  TERM GOAL #3   Title Pt will be able to jog for at  least 5 minutes without R hip pain to promote ability to participate in exercise and promote physical health    Baseline increased R hip pain when jogging or running (03/19/2019); able to perform mini jog with short stride length (05/01/2019), (05/26/2019); Able to jog about 5 min except for this past Monday (07/08/2019); Pt states being able to jog, but does not know if it is painless (09/01/2019)    Time 6    Period Weeks    Status Partially Met    Target Date 10/16/19                 Plan - 09/29/19 0809    Clinical Impression Statement Decreased overall R hip pain compared to 2 weeks ago based on subjective pain level reports. Continued working on improving transversus abdominis contraction, as well as improving hip flexor and hip adductor muscle strength while promoting proper stress to hip flexor tendon, proximal hip adductor and distal abdominal muscle insertions to promote healing. Pt will benefit from continued skilled physical therapy services to decrease pain, improve strength and function.    Personal Factors and Comorbidities Comorbidity 1    Comorbidities hx of C-section    Examination-Activity Limitations Squat    Stability/Clinical Decision Making Stable/Uncomplicated    Rehab Potential Good    PT Frequency 1x / week    PT Duration 6 weeks    PT Treatment/Interventions Therapeutic activities;Therapeutic exercise;Neuromuscular re-education;Patient/family education;Manual techniques;Dry needling;Spinal Manipulations;Joint Manipulations;Aquatic Therapy;Electrical Stimulation;Iontophoresis 40m/ml Dexamethasone;Ultrasound    PT Next Visit Plan isometric, then concentric, then eccentric hip and abdominal strengthening, manual techniques, modalities PRN    PT Home Exercise Plan Medbridge Access Code: [email or web address]    Consulted and Agree with Plan of Care Patient           Patient will benefit from skilled therapeutic intervention in  order to improve the following deficits and impairments:  Pain, Improper body mechanics, Postural dysfunction, Decreased strength  Visit Diagnosis: Pain in right hip  Muscle weakness (generalized)     Problem List Patient Active Problem List   Diagnosis Date Noted  . S/P cesarean section - triplets 11/29 02/17/2012  . Postpartum care following cesarean delivery (11/29) 02/17/2012  . Rh negative, maternal 02/17/2012  . 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 Routine general medical examination at a health care facility 06/23/2010  . FIBROCYSTIC BREAST DISEASE 07/17/2008  . Asthma, 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    MJoneen BoersPT, DPT   09/29/2019, 9:10 AM  [facility] [address] [place] [address]Phone: [phone]  Fax:  [phone] Name: Nicole HINDSMRN: [id number] of Birth: 910/29/1982

## 2019-10-06 ENCOUNTER — Ambulatory Visit: Payer: BC Managed Care – PPO

## 2019-10-06 ENCOUNTER — Other Ambulatory Visit: Payer: Self-pay

## 2019-10-06 DIAGNOSIS — M6281 Muscle weakness (generalized): Secondary | ICD-10-CM | POA: Diagnosis not present

## 2019-10-06 DIAGNOSIS — M25551 Pain in right hip: Secondary | ICD-10-CM

## 2019-10-06 NOTE — Therapy (Signed)
[facility] [address] [place], [address] Phone: [phone]   Fax:  [phone]  Physical Therapy Treatment  Patient Details  Name: Nicole Owens MRN: [id number] Date of Birth: Sep 26, 1980 Referring Provider (PT): Lynne Leader, MD   Encounter Date: 10/06/2019   PT End of Session - 10/06/19 0805    Visit Number 33    Number of Visits 37    Date for PT Re-Evaluation 10/16/19    PT Start Time 0805    PT Stop Time 0858    PT Time Calculation (min) 53 min    Activity Tolerance Patient tolerated treatment well    Behavior During Therapy [facility] for tasks assessed/performed           Past Medical History:  Diagnosis Date  . Asthma   . Asthma, intermittent    mild  . Carpal tunnel syndrome on both sides    with pregnancy  . Female infertility 04/30/2010   history restored after error with record merge  . Heartburn in pregnancy    nausea  . 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 Postpartum care following cesarean delivery 02/17/2012  . Rh negative, maternal 02/17/2012  . S/P cesarean section - triplets 11/29 02/17/2012    Past Surgical History:  Procedure Laterality Date  . CESAREAN SECTION  02/16/2012   Procedure: CESAREAN SECTION;  Surgeon: Lovenia Kim, MD;  Location: [facility];  Service: Obstetrics;  Laterality: N/A;  . NO PAST SURGERIES    . WISDOM TOOTH EXTRACTION      There were no vitals filed for this visit.   Subjective Assessment - 10/06/19 0806    Subjective R hip is ok. 2-3/10 off and on. Did the gym this morning, full body, did squats, lifted weights. Has the Spartan in 2 weeks. Did the burpies, modified, and it did not hurt.  3-4/10 at worst for the past 7 days.  Has not had a lot of the R hip clicking.    Pertinent History R hip pain. Pain began mid November 2020. Pt was at the gym. Pt was performing scissor kicks. Felt a little pain more than normal. Went for a backward lunge  with R LE and felt sharp pain R lower abdomen. Took 10 days off and iced that area. Did not really get better. Was diagnosed with sports hernia. Still getting pain but getting a little better. Could not jog before 6 weeks ago. Thinks she can jog slowly now. Using her R LE to stand up from a R half kneeling position bothers her. Pt goes to the gym 6 days a week before, no goes to the gym 1 time a week and performs modified upper body work out. Pivoting the wrong way sometimes would hurt. Going up the steps 2 at a time bothers her R hip. Getting out of bed involving SLR hip flexion movement would bother it. Coughing, sneezing would bother R hip. Had C section 7 years ago for triplets. The pain is along the line of the incision.    Currently in Pain? Yes    Pain Score 3    2-3/10 R hip on and off                                    PT Education - 10/06/19 0818    Education Details ther-ex, HEP    Person(s) Educated  Patient    Methods Explanation;Demonstration;Tactile cues;Verbal cues    Comprehension Returned demonstration;Verbalized understanding          Objective   No latex allergies   Has a 10k obstacle course race end of July 2021   MedbridgeAccess Code: [email or web address]    Pt wants to work up to being able to do planks, pushups, sit ups, spider and mountain climbers, jumps  Therapeutic exercise  Supine posterior pelvic tilt with transversus abdominis contraction 10x10 seconds Marches 20x 5 second holds each LE              Then with alternate knee extension 5x 5 seconds for 2 sets each LE  Then with heel slides without heels touching table. 10x  Prone quad stretch R LE 1 minute x 3  Prone glute max extension 5x5 seconds for 2 sets  R anterior hip discomfort, eases with rest  Supine hamstring stretch   R 30 seconds x 3  Supine R SLR hip flexion with PT assist 10x with transversus abdominis muscle  activation.  Improved strength observed with less need for PT assist with first 3 repetitions.   R anterior hip discomfort, eases with rest  standing pallof press, double red band  R 10x 5 seconds  L 6x 5 seconds  3-4/10 R anterior hip discomfort during exercise, eases with rest  Treadmill jog, speed 4.0 x 5 minutes  Normal stride length observed  2/10 at most during jog. Eases with rest  warm down at speed 1.9 x 2 minutes    Improved exercise technique, movement at target joints, use of target muscles aftermin tomod verbal, visual, tactile cues.    Response to treatment Good muscle use felt with exercises. Slight increase in discomfort towards end of session    Clinical presentation Increased transversus abdominis muscle exercise repetitions to improve endurance and strength to promote lumbopelvic control for R hip flexors and help improve diastasis recti. Continued working on applying appropriate stress to R hip flexor muscles to continue to promote healing. Able to jog x 5 minutes today at speed 4.0 with 2/10 R hip pain at most during jog, eases with rest. Decreased overall R hip clicking based on subjective reports. Fair tolerance to PT today. Pt will benefit from continued skilled physical therapy services to decrease pain, improve strength, and function.        PT Short Term Goals - 05/01/19 1149      PT SHORT TERM GOAL #1   Title Patient will be independent with her HEP to decrease pain, improve strength and function.    Baseline Pt has started her HEP (03/19/2019); Pt demonstrates independence with her HEP (05/01/2019)    Time 3    Period Weeks    Status Achieved    Target Date 04/10/19             PT Long Term Goals - 09/01/19 0834      PT LONG TERM GOAL #1   Title Pt will have a decrease in R hip pain to 2/10 or less at worst to promote ability to run, perform exercises, stand up from a half kneeling position more comfortably.    Baseline 6/10  R hip pain at most (03/19/2019); 2-3/10 at most for the past 7 days (05/01/2019); 2-3/10 at most for the past 7 days (05/12/19), (05/26/2019); 3/10 (07/08/2019); 4/10 at most for the past 7 days (after workout at her gym and softball)    Time 6    Period Weeks  Status Partially Met    Target Date 10/16/19      PT LONG TERM GOAL #2   Title Patient will improve R hip flexion, extension, abduction, and adduction strength by at least 1/2 MMT grade to promote ability to perform functional tasks more comfortably.    Baseline improved by 1/2 MMT grade but pain with hip adduction (05/26/2019); maintained except hip extension (07/08/2019)    Time 6    Period Weeks    Status Partially Met    Target Date 10/16/19      PT LONG TERM GOAL #3   Title Pt will be able to jog for at least 5 minutes without R hip pain to promote ability to participate in exercise and promote physical health    Baseline increased R hip pain when jogging or running (03/19/2019); able to perform mini jog with short stride length (05/01/2019), (05/26/2019); Able to jog about 5 min except for this past Monday (07/08/2019); Pt states being able to jog, but does not know if it is painless (09/01/2019)    Time 6    Period Weeks    Status Partially Met    Target Date 10/16/19                 Plan - 10/06/19 0820    Clinical Impression Statement Increased transversus abdominis muscle exercise repetitions to improve endurance and strength to promote lumbopelvic control for R hip flexors and help improve diastasis recti. Continued working on applying appropriate stress to R hip flexor muscles to continue to promote healing. Able to jog x 5 minutes today at speed 4.0 with 2/10 R hip pain at most during jog, eases with rest. Decreased overall R hip clicking based on subjective reports. Fair tolerance to PT today. Pt will benefit from continued skilled physical therapy services to decrease pain, improve strength, and function.    Personal  Factors and Comorbidities Comorbidity 1    Comorbidities hx of C-section    Examination-Activity Limitations Squat    Stability/Clinical Decision Making Stable/Uncomplicated    Rehab Potential Good    PT Frequency 1x / week    PT Duration 6 weeks    PT Treatment/Interventions Therapeutic activities;Therapeutic exercise;Neuromuscular re-education;Patient/family education;Manual techniques;Dry needling;Spinal Manipulations;Joint Manipulations;Aquatic Therapy;Electrical Stimulation;Iontophoresis 84m/ml Dexamethasone;Ultrasound    PT Next Visit Plan isometric, then concentric, then eccentric hip and abdominal strengthening, manual techniques, modalities PRN    PT Home Exercise Plan Medbridge Access Code: [email or web address]    Consulted and Agree with Plan of Care Patient           Patient will benefit from skilled therapeutic intervention in order to improve the following deficits and impairments:  Pain, Improper body mechanics, Postural dysfunction, Decreased strength  Visit Diagnosis: Pain in right hip  Muscle weakness (generalized)     Problem List Patient Active Problem List   Diagnosis Date Noted  . S/P cesarean section - triplets 11/29 02/17/2012  . Postpartum care following cesarean delivery (11/29) 02/17/2012  . Rh negative, maternal 02/17/2012  . 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 Routine general medical examination at a health care facility 06/23/2010  . FIBROCYSTIC BREAST DISEASE 07/17/2008  . Asthma, 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    MJoneen BoersPT, DPT   10/06/2019, 9:20 AM  [facility] [address] [place] [id number]: [phone]  Fax:  [phone] Name: Nicole ROCKHOLTMRN: [id number] of Birth: [birth year]

## 2019-10-06 NOTE — Patient Instructions (Signed)
Access Code: [email or web address] URL: [email or web address] Date: 10/06/2019 Prepared by: Loralyn Freshwater  Exercises Seated Hip Flexion - 1 x daily - 7 x weekly - 10 reps - 3 sets Supine Bridge with Mini Swiss Ball Between Knees - 1 x daily - 7 x weekly - 10 reps - 3 sets - 5 seconds hold Bent Knee Fallouts - 1 x daily - 7 x weekly - 10 reps - 3 sets Single Leg Stance with Support - 3 x daily - 7 x weekly - 10 reps - 3 sets - 5 seconds hold Shoulder extension with resistance - Neutral - 1 x daily - 7 x weekly - 10 reps - 3 sets - 10 hold Straight Leg Raise - 1 x daily - 7 x weekly - 2 sets - 10 reps Prone Quadriceps Stretch with Strap - 3 x daily - 7 x weekly - 1 sets - 3 reps - 30 seconds hold Seated Hip Adduction Isometrics with Ball - 1 x daily - 7 x weekly - 3 sets - 10 reps - 10 seconds hold Supine Transversus Abdominis Bracing - Hands on Stomach - 3 x daily - 7 x weekly - 1 sets - 5 reps - 1 minute hold Bird Dog - 1 x daily - 7 x weekly - 2 sets - 10 reps Supine Transversus Abdominis Bracing with Heel Slide - 1 x daily - 7 x weekly - 2 sets - 10 reps Supine March with Posterior Pelvic Tilt - 1 x daily - 7 x weekly - 3 sets - 20 reps - 5 seconds hold

## 2019-10-08 ENCOUNTER — Other Ambulatory Visit: Payer: Self-pay

## 2019-10-08 ENCOUNTER — Ambulatory Visit: Payer: BC Managed Care – PPO | Admitting: Internal Medicine

## 2019-10-08 ENCOUNTER — Encounter: Payer: Self-pay | Admitting: Internal Medicine

## 2019-10-08 VITALS — BP 138/82 | HR 68 | Temp 98.2°F | Ht 62.0 in | Wt 158.8 lb

## 2019-10-08 DIAGNOSIS — J45909 Unspecified asthma, uncomplicated: Secondary | ICD-10-CM | POA: Diagnosis not present

## 2019-10-08 MED ORDER — BUDESONIDE-FORMOTEROL FUMARATE 80-4.5 MCG/ACT IN AERO: 2.0000 | INHALATION_SPRAY | Freq: Two times a day (BID) | RESPIRATORY_TRACT | 11 refills | Status: DC

## 2019-10-08 MED ORDER — ALBUTEROL SULFATE HFA 108 (90 BASE) MCG/ACT IN AERS: INHALATION_SPRAY | RESPIRATORY_TRACT | 2 refills | Status: DC

## 2019-10-08 NOTE — Patient Instructions (Signed)
ICD-10-CM   1. Asthma, intrinsic  J45.909    Asthma is well controlled  Plan -Reduce strength on the Symbicort: Take Symbicort 80/4.52 puff 2 times daily -Use albuterol as needed for exercise and for rescue -Do warm up and cool down with exercises -Flu shot in the fall and Covid vaccine boosters if any per protocol  Follow-up -9-12 months or sooner if needed -  -Asthma control test questionnaire at follow-up

## 2019-10-08 NOTE — Progress Notes (Signed)
OV 04/16/2015  Chief Complaint  Patient presents with  . Follow-up    Former [facility] pt. Pt states her breathing is doing well. Pt states she only gets SOB if she is exposed to her "normal" triggers - smoke, cold, fragrences. Pt denies cough and CP/tightness.    39 year old mother of triplets were 79 years old she has a history of asthma and used to be followed by Dr. Jonathon Bellows. She is here for routine one-year follow-up. Background history of asthma since middle school. Last admission for asthma or emergency room visit was several years ago. Last prednisone use was a few years ago. She has cat sensitivity but not to the cat she has in her house but only to cats outside her house. She is on Symbicort for a few years. Compliance is around 50-60 percent. Because of triplet she forgets to take them at night. Overall she is doing well with her asthma. She is no longer running because of her busy life with triplets. Asthma control question score is 0 out of 6. In particular she does not wake up at night because of asthma. When she wakes up in the morning she has no symptoms. She is not limited in her activities because of asthma. She does not expands dyspnea because of asthma. And she has not had any wheeze. She has not used albuterol in the last 1 week. In total she is use albuterol 3 times in the last few months.  Exhaled nitric oxide is 21 ppb and is normal.   She has reluctantly agreed to have flu shot    OV 10/07/2015  Chief Complaint  Patient presents with  . Follow-up    Pt reports she is unable to do any running w/o having to use her rescue inhaler.     Follow-up moderate persistent asthma on Symbicort  Last seen early 2017. This is a routine 6 month follow-up. She says that now she's been running several times a week 3 miles as part of her routine exercise/marathon preparation. She feels that after I reduced his Symbicort dosage based on 50% compliance and normal exhaled nitric oxide, she  feels that she's having increased symptomatology. In fact asthma control question shows score of 1.2 whereas it was 0 before. She feels that her runs of more difficult. This is despite the fact she warms up. She is also saying that she feels the radius Symbicort is responsible despite factoring in the current heat and humidity and taking albuterol before the runs. Otherwise feels fine no problems.   OV 08/01/2017  Chief Complaint  Patient presents with  . Follow-up    Last seen 10/07/15. Pt states she has been doing good and denies any complaints or concerns.   Follow-up moderate persistent asthma on Symbicort  Last seen July 2017.  She then did not follow-up because she has been doing well.  She made this appointment after she started realizing she was running out of Symbicort and call for a refill in the office asked her to make a follow-up.  She tells me that she is 100% compliant with Symbicort 160/4 0.5 to 2 puffs twice daily.  Her albuterol use is rare.  She works out at [employer] and also does running.  She does high intensity interval training.  For all this she does warm up and cool down as advised her at the previous visit.  She also takes albuterol preexercise and during exercise if needed.  Her asthma control  questionnaire is 0.6 on a five-point scale but all of the symptoms are only at exercise.  At baseline she does not wake up at night and when she wakes up she has no symptoms.  She feels very slightly limited because of her asthma and she is a little short of breath because of asthma and it is totally during sprints and workouts.  She does not wheeze and she uses her albuterol 1 or 2 times a week for high intensity workout only.  I have told her that she would need at least annual follow-up    OV 10/08/2019   Subjective:  Patient ID: Nicole Owens, female , DOB: [birth year], age 41 y.o. years. , MRN: [id number],  ADDRESS: [address] [place] [address] PCP  Patient, No Pcp  Per Providers : Treatment Team:  Attending Provider: Kalman Shan, MD   Chief Complaint  Patient presents with  . Follow-up    No complaints    Moderate persistent asthma on Symbicort   HPI Nicole Owens 39 y.o. -returns for follow-up.  She continues her Symbicort daily.  Not seen her in 2 years.  She says that she called for a refill and because it has been 2 years the office asked her to make a follow-up visit.  She uses her albuterol for rescue when the weather changes.  She then wakes up in the middle of the night with chest tightness and she needs albuterol.  This happens approximately every 6 weeks or so.  She is only used albuterol for rescue 1 time in the week during the daytime.  She does use albuterol as part of her preexercise regimen.  She feels overall that her asthma is extremely well controlled.  She is willing to go down to the lower dose of Symbicort because she is on the full dose of 160/4.52 puff 2 times daily.    Asthma Control Panel 04/16/15 10/07/2015  08/01/2017   Current Med Regimen   symbicort at 50% compliance symbicort 2puff once dail symbicort 160 at 2 puff bid  ACQ 5 point- 1 week. wtd avg score. <1.0 is good control 0.75-1.25 is grey zone. >1.25 poor control. Delta 0.5 is clinically meaningful 0 1.2 0.6 and only with exdrcise  FeNO ppB 21ppb 42 25 ppb  FeV1      Planned intervention  for visit  Increase symbi Continue symbi         ROS - per HPI     has a past medical history of Asthma, Asthma, intermittent, Carpal tunnel syndrome on both sides, Female infertility (04/30/2010), Heartburn in pregnancy, 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   reports that she has never smoked. She has never used smokeless tobacco.  Past Surgical History:  Procedure Laterality Date  . CESAREAN SECTION   02/16/2012   Procedure: CESAREAN SECTION;  Surgeon: Lenoard Aden, MD;  Location: [facility];  Service: Obstetrics;  Laterality: N/A;  . NO PAST SURGERIES    . WISDOM TOOTH EXTRACTION      Allergies  Allergen Reactions  . Aspirin Shortness Of Breath    Can trigger asthma, told nurse ibuprofen makes her feel odd and only takes one 200mg  tablet if needed    Immunization History  Administered Date(s) Administered  . Influenza,inj,Quad PF,6+ Mos 03/11/2013, 04/16/2015  . PFIZER SARS-COV-2 Vaccination 06/07/2019, 06/28/2019  . Rho (D) Immune Globulin 02/18/2012  .  Td 06/17/2008  . Varicella 12/07/2010    Family History  Problem Relation Age of Onset  . Hypertension Father   . Obesity Sister      Current Outpatient Medications:  .  albuterol (VENTOLIN HFA) 108 (90 Base) MCG/ACT inhaler, INHALE TWO PUFFS BY MOUTH THREE TIMES DAILY AS NEEDED FOR WHEEZING OR  SHORTNESS  OF  BREATH, Disp: 18 g, Rfl: 0      Objective:   Vitals:   10/08/19 0928  BP: 138/82  Pulse: 68  Temp: 98.2 F (36.8 C)  TempSrc: Oral  SpO2: 100%  Weight: 158 lb 12.8 oz (72 kg)  Height: 5\' 2"  (1.575 m)    Estimated body mass index is 29.04 kg/m as calculated from the following:   Height as of this encounter: 5\' 2"  (1.575 m).   Weight as of this encounter: 158 lb 12.8 oz (72 kg).  @WEIGHTCHANGE @    10/08/19 0928  Weight: 158 lb 12.8 oz (72 kg)     Physical Exam overall nonfocal exam.  Clear to auscultation bilaterally no oral thrush alert and oriented x3 abdomen soft.  No neck nodes.  No elevated JVP.         Assessment:       ICD-10-CM   1. Asthma, intrinsic  J45.909        Plan:     Patient Instructions     ICD-10-CM   1. Asthma, intrinsic  J45.909    Asthma is well controlled  Plan -Reduce strength on the Symbicort: Take Symbicort 80/4.52 puff 2 times daily -Use albuterol as needed for exercise and for rescue -Do warm up and cool down with exercises -Flu  shot in the fall and Covid vaccine boosters if any per protocol  Follow-up -9-12 months or sooner if needed -  -Asthma control test questionnaire at follow-up     SIGNATURE    Dr. [employer], M.D., F.C.C.P,  Pulmonary and Critical Care Medicine Staff Physician, [facility] Center Director - Interstitial Lung Disease  Program  Pulmonary Fibrosis [facility], [facility], [facility]  Pager: [phone], If no answer or between  15:00h - 7:00h: call [phone] Telephone: [phone]  9:43 AM 10/08/2019

## 2019-10-13 ENCOUNTER — Other Ambulatory Visit: Payer: Self-pay

## 2019-10-13 ENCOUNTER — Ambulatory Visit: Payer: BC Managed Care – PPO

## 2019-10-13 DIAGNOSIS — M6281 Muscle weakness (generalized): Secondary | ICD-10-CM

## 2019-10-13 DIAGNOSIS — M25551 Pain in right hip: Secondary | ICD-10-CM | POA: Diagnosis not present

## 2019-10-13 NOTE — Therapy (Signed)
[facility] [address], [place], [address] Phone: [phone]   Fax:  [phone]  Physical Therapy Treatment  Patient Details  Name: Nicole Owens MRN: [id number] Date of Birth: [birth year] Referring Provider (PT): Lynne Leader, MD   Encounter Date: 10/13/2019   PT End of Session - 10/13/19 0848    Visit Number 34    Number of Visits 37    Date for PT Re-Evaluation 10/16/19    PT Start Time 0805    PT Stop Time 0845    PT Time Calculation (min) 40 min    Activity Tolerance Patient tolerated treatment well    Behavior During Therapy [facility] for tasks assessed/performed           Past Medical History:  Diagnosis Date  . Asthma   . Asthma, intermittent    mild  . Carpal tunnel syndrome on both sides    with pregnancy  . Female infertility 04/30/2010   history restored after error with record merge  . Heartburn in pregnancy    nausea  . 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 Postpartum care following cesarean delivery 02/17/2012  . Rh negative, maternal 02/17/2012  . S/P cesarean section - triplets 11/29 02/17/2012    Past Surgical History:  Procedure Laterality Date  . CESAREAN SECTION  02/16/2012   Procedure: CESAREAN SECTION;  Surgeon: Lovenia Kim, MD;  Location: [facility];  Service: Obstetrics;  Laterality: N/A;  . NO PAST SURGERIES    . WISDOM TOOTH EXTRACTION      There were no vitals filed for this visit.   Subjective Assessment - 10/13/19 0806    Subjective Patient reported that she thinks her hip is slowly getting better, but is pretty sore today, stated that she did go to the gym this AM, did have some popping which it hasn't done in a few weeks.    Pertinent History R hip pain. Pain began mid November 2020. Pt was at the gym. Pt was performing scissor kicks. Felt a little pain more than normal. Went for a backward lunge with R LE and felt sharp pain R lower abdomen. Took 10 days  off and iced that area. Did not really get better. Was diagnosed with sports hernia. Still getting pain but getting a little better. Could not jog before 6 weeks ago. Thinks she can jog slowly now. Using her R LE to stand up from a R half kneeling position bothers her. Pt goes to the gym 6 days a week before, no goes to the gym 1 time a week and performs modified upper body work out. Pivoting the wrong way sometimes would hurt. Going up the steps 2 at a time bothers her R hip. Getting out of bed involving SLR hip flexion movement would bother it. Coughing, sneezing would bother R hip. Had C section 7 years ago for triplets. The pain is along the line of the incision.    Patient Stated Goals Anything in the lunge position, be able to run (pt runs half marathons)    Currently in Pain? Yes    Pain Score 2     Pain Location Hip    Pain Orientation Right    Pain Descriptors / Indicators Sore    Pain Type Acute pain             Objective    No latex allergies    Has a 10k obstacle course race end of  July 2021    Medbridge Access Code: [email or web address]      Pt wants to work up to being able to do planks, pushups, sit ups, spider and mountain climbers, jumps   Therapeutic exercise    Supine posterior pelvic tilt with transversus abdominis contraction 10x10 seconds             Marches 20x 5 second holds each LE               Then with alternate knee extension 5x 5 seconds for 2 sets each LE               Then with heel slides without heels touching table. 10x   Prone quad stretch R LE 1 minute x 3   Prone glute max extension 5x5 seconds for 2 sets             R anterior hip discomfort, eases with rest   Supine hamstring stretch               R 30 seconds x 3   Manual therapy:  Deep tissue techniques/ trigger pointing to R posterior lumbar extensors, increased tension noted on R compared to L L hip flexor trigger point release                Improved exercise technique, movement at  target joints, use of target muscles after min to mod verbal, visual, tactile cues.      Response to treatment/ Clinical presentation    Pt reported feeling fatigued with transverse abdominis exercises, pt able to feel her discomfort that resolved with rest. Session focused on attempting to release soft tissue restrictions as well. The patient may benefit from a PT pelvic health evaluation, did have complaints of some BM abnormalities and has at least 3 finger width diastasis recti. Recommend diaphragmatic and breath training, further assessment and release of R posterior lumbar extensors and R hip flexor/iliacus release, trial TDN as able. The pt will benefit from continued skilled physical therapy services to decrease pain, improve strength, and function.      PT Education - 10/13/19 0808    Education Details therex    Person(s) Educated Patient    Methods Explanation;Demonstration;Tactile cues;Verbal cues    Comprehension Verbalized understanding;Returned demonstration;Verbal cues required            PT Short Term Goals - 05/01/19 1149      PT SHORT TERM GOAL #1   Title Patient will be independent with her HEP to decrease pain, improve strength and function.    Baseline Pt has started her HEP (03/19/2019); Pt demonstrates independence with her HEP (05/01/2019)    Time 3    Period Weeks    Status Achieved    Target Date 04/10/19             PT Long Term Goals - 09/01/19 0834      PT LONG TERM GOAL #1   Title Pt will have a decrease in R hip pain to 2/10 or less at worst to promote ability to run, perform exercises, stand up from a half kneeling position more comfortably.    Baseline 6/10 R hip pain at most (03/19/2019); 2-3/10 at most for the past 7 days (05/01/2019); 2-3/10 at most for the past 7 days (05/12/19), (05/26/2019); 3/10 (07/08/2019); 4/10 at most for the past 7 days (after workout at her gym and softball)    Time 6    Period Weeks  Status Partially Met    Target  Date 10/16/19      PT LONG TERM GOAL #2   Title Patient will improve R hip flexion, extension, abduction, and adduction strength by at least 1/2 MMT grade to promote ability to perform functional tasks more comfortably.    Baseline improved by 1/2 MMT grade but pain with hip adduction (05/26/2019); maintained except hip extension (07/08/2019)    Time 6    Period Weeks    Status Partially Met    Target Date 10/16/19      PT LONG TERM GOAL #3   Title Pt will be able to jog for at least 5 minutes without R hip pain to promote ability to participate in exercise and promote physical health    Baseline increased R hip pain when jogging or running (03/19/2019); able to perform mini jog with short stride length (05/01/2019), (05/26/2019); Able to jog about 5 min except for this past Monday (07/08/2019); Pt states being able to jog, but does not know if it is painless (09/01/2019)    Time 6    Period Weeks    Status Partially Met    Target Date 10/16/19                 Plan - 10/13/19 0848    Clinical Impression Statement Pt reported feeling fatigued with transverse abdominis exercises, pt able to feel her discomfort that resolved with rest. Session focused on attempting to release soft tissue restrictions as well. The patient may benefit from a PT pelvic health evaluation, did have complaints of some BM abnormalities and has at least 3 finger width diastasis recti. Recommend diaphragmatic and breath training, further assessment and release of R posterior lumbar extensors and R hip flexor/iliacus release, trial TDN as able. The pt will benefit from continued skilled physical therapy services to decrease pain, improve strength, and function.    Personal Factors and Comorbidities Comorbidity 1    Comorbidities hx of C-section    Examination-Activity Limitations Squat    Stability/Clinical Decision Making Stable/Uncomplicated    Rehab Potential Good    PT Frequency 1x / week    PT Duration 6 weeks     PT Treatment/Interventions Therapeutic activities;Therapeutic exercise;Neuromuscular re-education;Patient/family education;Manual techniques;Dry needling;Spinal Manipulations;Joint Manipulations;Aquatic Therapy;Electrical Stimulation;Iontophoresis 53m/ml Dexamethasone;Ultrasound    PT Next Visit Plan isometric, then concentric, then eccentric hip and abdominal strengthening, manual techniques, modalities PRN    PT Home Exercise Plan Medbridge Access Code: [email or web address]    Consulted and Agree with Plan of Care Patient           Patient will benefit from skilled therapeutic intervention in order to improve the following deficits and impairments:  Pain, Improper body mechanics, Postural dysfunction, Decreased strength  Visit Diagnosis: Pain in right hip  Muscle weakness (generalized)     Problem List Patient Active Problem List   Diagnosis Date Noted  . S/P cesarean section - triplets 11/29 02/17/2012  . Postpartum care following cesarean delivery (11/29) 02/17/2012  . Rh negative, maternal 02/17/2012  . 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 Routine general medical examination at a health care facility 06/23/2010  . FIBROCYSTIC BREAST DISEASE 07/17/2008  . Asthma, 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    DLieutenant DiegoPT, DPT 8:55 AM,10/13/19   [facility] [address] [place] [address]Phone: [phone]  Fax:  [phone] Name: Nicole SEGARMRN: [id number] of Birth: [birth year]

## 2019-10-27 DIAGNOSIS — Z03818 Encounter for observation for suspected exposure to other biological agents ruled out: Secondary | ICD-10-CM | POA: Diagnosis not present

## 2019-10-27 DIAGNOSIS — Z20822 Contact with and (suspected) exposure to covid-19: Secondary | ICD-10-CM | POA: Diagnosis not present

## 2019-10-27 DIAGNOSIS — Z1152 Encounter for screening for COVID-19: Secondary | ICD-10-CM | POA: Diagnosis not present

## 2019-10-28 ENCOUNTER — Ambulatory Visit: Payer: BC Managed Care – PPO | Attending: Family Medicine

## 2019-10-28 ENCOUNTER — Other Ambulatory Visit: Payer: Self-pay

## 2019-10-28 DIAGNOSIS — M25551 Pain in right hip: Secondary | ICD-10-CM | POA: Insufficient documentation

## 2019-10-28 DIAGNOSIS — M6281 Muscle weakness (generalized): Secondary | ICD-10-CM | POA: Diagnosis not present

## 2019-10-28 NOTE — Therapy (Addendum)
[facility] [address], [place], [address] Phone: [phone]   Fax:  [phone]  Physical Therapy Treatment  Patient Details  Name: Nicole Owens MRN: [id number] Date of Birth: Jan 22, 1981 Referring Provider (PT): Lynne Leader, MD   Encounter Date: 10/28/2019   PT End of Session - 10/28/19 1124    Visit Number 35    Number of Visits 37    Date for PT Re-Evaluation 11/05/19    PT Start Time 1120    PT Stop Time 1200    PT Time Calculation (min) 40 min    Activity Tolerance Patient tolerated treatment well;No increased pain    Behavior During Therapy WFL for tasks assessed/performed           Past Medical History:  Diagnosis Date  . Asthma   . Asthma, intermittent    mild  . Carpal tunnel syndrome on both sides    with pregnancy  . Female infertility 04/30/2010   history restored after error with record merge  . Heartburn in pregnancy    nausea  . 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 Postpartum care following cesarean delivery 02/17/2012  . Rh negative, maternal 02/17/2012  . S/P cesarean section - triplets 11/29 02/17/2012    Past Surgical History:  Procedure Laterality Date  . CESAREAN SECTION  02/16/2012   Procedure: CESAREAN SECTION;  Surgeon: Lovenia Kim, MD;  Location: [facility];  Service: Obstetrics;  Laterality: N/A;  . NO PAST SURGERIES    . WISDOM TOOTH EXTRACTION      There were no vitals filed for this visit.   Subjective Assessment - 10/28/19 1123    Subjective Pt continues to have pain in her Right hip as well as her Left hip since her race. She has been running minimally since. No other updates since last session.    Pertinent History R hip pain. Pain began mid November 2020. Pt was at the gym. Pt was performing scissor kicks. Felt a little pain more than normal. Went for a backward lunge with R LE and felt sharp pain R lower abdomen. Took 10 days off and iced that  area. Did not really get better. Was diagnosed with sports hernia. Still getting pain but getting a little better. Could not jog before 6 weeks ago. Thinks she can jog slowly now. Using her R LE to stand up from a R half kneeling position bothers her. Pt goes to the gym 6 days a week before, no goes to the gym 1 time a week and performs modified upper body work out. Pivoting the wrong way sometimes would hurt. Going up the steps 2 at a time bothers her R hip. Getting out of bed involving SLR hip flexion movement would bother it. Coughing, sneezing would bother R hip. Had C section 7 years ago for triplets. The pain is along the line of the incision.    Currently in Pain? Yes    Pain Score 2            INTERVENTION THIS DATE:  -Supine SKTC stretch c contralateral hip neutral 3x30sec bilat  -Prone Quads Stretch with belt 3x30sec bilat  -Prone hip extension stretch c pelvis stabilization 2x30sec  -Manual release Rt psoas, proximal and distal 1x60sec each  -reverse curl-up isometrics 1x8 (early fatigue of transverse ABD) -Quadruped A/ROM Transverse ABDCT 10x10sec, then same with paired breathing exercise *education on importance of isolated trABD activation without diaphragmatic bracing.  PT Short Term Goals - 05/01/19 1149      PT SHORT TERM GOAL #1   Title Patient will be independent with her HEP to decrease pain, improve strength and function.    Baseline Pt has started her HEP (03/19/2019); Pt demonstrates independence with her HEP (05/01/2019)    Time 3    Period Weeks    Status Achieved    Target Date 04/10/19             PT Long Term Goals - 09/01/19 0834      PT LONG TERM GOAL #1   Title Pt will have a decrease in R hip pain to 2/10 or less at worst to promote ability to run, perform exercises, stand up from a half kneeling position more comfortably.    Baseline 6/10 R hip pain at most (03/19/2019); 2-3/10 at most for the past 7 days (05/01/2019); 2-3/10 at most for the  past 7 days (05/12/19), (05/26/2019); 3/10 (07/08/2019); 4/10 at most for the past 7 days (after workout at her gym and softball)    Time 6    Period Weeks    Status Partially Met    Target Date 10/16/19      PT LONG TERM GOAL #2   Title Patient will improve R hip flexion, extension, abduction, and adduction strength by at least 1/2 MMT grade to promote ability to perform functional tasks more comfortably.    Baseline improved by 1/2 MMT grade but pain with hip adduction (05/26/2019); maintained except hip extension (07/08/2019)    Time 6    Period Weeks    Status Partially Met    Target Date 10/16/19      PT LONG TERM GOAL #3   Title Pt will be able to jog for at least 5 minutes without R hip pain to promote ability to participate in exercise and promote physical health    Baseline increased R hip pain when jogging or running (03/19/2019); able to perform mini jog with short stride length (05/01/2019), (05/26/2019); Able to jog about 5 min except for this past Monday (07/08/2019); Pt states being able to jog, but does not know if it is painless (09/01/2019)    Time 6    Period Weeks    Status Partially Met    Target Date 10/16/19                 Plan - 10/28/19 1127    Clinical Impression Statement Continued with current plan of care, gently progressing patient's program aimed at address deficits and limitations identified in evlauation. Pt continues to make steady progress toward treatment goals in general. Author provides extensive verbal, visual, and tactile cues when needed to assure all interventions are performed with desired form and good accuracy. Extensive communicaiton to assure pt is able to perform all activities without exacerbation of pain or other symptoms. Pt is able to activate TrABD when cued, but struggles with independent activation without paired diaphragm activity which limited excursion of muscle due to increased intraabdominal pressure. Pt instructed on performing  breahting activity at home for improved motor control and proprioception.    Personal Factors and Comorbidities Comorbidity 1    Comorbidities hx of C-section    Examination-Activity Limitations Squat    Stability/Clinical Decision Making Stable/Uncomplicated    Clinical Decision Making Low    Rehab Potential Good    PT Frequency 1x / week    PT Duration 6 weeks    PT Treatment/Interventions Therapeutic activities;Therapeutic  exercise;Neuromuscular re-education;Patient/family education;Manual techniques;Dry needling;Spinal Manipulations;Joint Manipulations;Aquatic Therapy;Electrical Stimulation;Iontophoresis 39m/ml Dexamethasone;Ultrasound    PT Next Visit Plan isometric, then concentric, then eccentric hip and abdominal strengthening, manual techniques, modalities PRN    PT Home Exercise Plan Medbridge Access Code: [email or web address]    Consulted and Agree with Plan of Care Patient           Patient will benefit from skilled therapeutic intervention in order to improve the following deficits and impairments:  Pain, Improper body mechanics, Postural dysfunction, Decreased strength  Visit Diagnosis: Pain in right hip  Muscle weakness (generalized)     Problem List Patient Active Problem List   Diagnosis Date Noted  . S/P cesarean section - triplets 11/29 02/17/2012  . Postpartum care following cesarean delivery (11/29) 02/17/2012  . Rh negative, maternal 02/17/2012  . 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 Routine general medical examination at a health care facility 06/23/2010  . FIBROCYSTIC BREAST DISEASE 07/17/2008  . Asthma, 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   12:18 PM, 10/28/19 AEtta Grandchild PT, DPT Physical Therapist - [phone](Office)    Averiana Clouatre C 10/28/2019, 11:32 AM  [facility] [address] [place] [address]Phone: [phone]  Fax:  [phone] Name: NGRACELYNN BIRCHERMRN: [id number] of Birth: 901-29-1982

## 2019-11-03 ENCOUNTER — Ambulatory Visit: Payer: BC Managed Care – PPO

## 2019-11-04 ENCOUNTER — Other Ambulatory Visit: Payer: Self-pay

## 2019-11-04 ENCOUNTER — Ambulatory Visit: Payer: BC Managed Care – PPO

## 2019-11-04 DIAGNOSIS — M25551 Pain in right hip: Secondary | ICD-10-CM | POA: Diagnosis not present

## 2019-11-04 DIAGNOSIS — M6281 Muscle weakness (generalized): Secondary | ICD-10-CM

## 2019-11-04 NOTE — Therapy (Signed)
[facility] [address], [place], [address] Phone: [phone]   Fax:  [phone]  Physical Therapy Treatment  Patient Details  Name: Nicole Owens MRN: [id number] Date of Birth: 1981/02/04 Referring Provider (PT): Lynne Leader, MD   Encounter Date: 11/04/2019   PT End of Session - 11/04/19 1516    Visit Number 36    Number of Visits 45    Date for PT Re-Evaluation 01/01/20    PT Start Time 5102    PT Stop Time 1559    PT Time Calculation (min) 43 min    Activity Tolerance Patient tolerated treatment well;No increased pain    Behavior During Therapy WFL for tasks assessed/performed           Past Medical History:  Diagnosis Date  . Asthma   . Asthma, intermittent    mild  . Carpal tunnel syndrome on both sides    with pregnancy  . Female infertility 04/30/2010   history restored after error with record merge  . Heartburn in pregnancy    nausea  . 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 Postpartum care following cesarean delivery 02/17/2012  . Rh negative, maternal 02/17/2012  . S/P cesarean section - triplets 11/29 02/17/2012    Past Surgical History:  Procedure Laterality Date  . CESAREAN SECTION  02/16/2012   Procedure: CESAREAN SECTION;  Surgeon: Lovenia Kim, MD;  Location: [facility];  Service: Obstetrics;  Laterality: N/A;  . NO PAST SURGERIES    . WISDOM TOOTH EXTRACTION      There were no vitals filed for this visit.   Subjective Assessment - 11/04/19 1517    Subjective R hip did not bother her during the [place] race. L hip bothered her though. R hip is not bothering her at the moment. Was a little irratated from work. Has been modiftying her workouts at the gym. Eliminated all her burpies. 2-3/10 R hip pain at most for the past 7 days.  Still has difficulty raising her R leg (such as SLR hip flexion). Pt states wanting to contiue PT 1x/week to make sure she continues to  recover in the right direction and to make sure she is doing things properly.    Pertinent History R hip pain. Pain began mid November 2020. Pt was at the gym. Pt was performing scissor kicks. Felt a little pain more than normal. Went for a backward lunge with R LE and felt sharp pain R lower abdomen. Took 10 days off and iced that area. Did not really get better. Was diagnosed with sports hernia. Still getting pain but getting a little better. Could not jog before 6 weeks ago. Thinks she can jog slowly now. Using her R LE to stand up from a R half kneeling position bothers her. Pt goes to the gym 6 days a week before, no goes to the gym 1 time a week and performs modified upper body work out. Pivoting the wrong way sometimes would hurt. Going up the steps 2 at a time bothers her R hip. Getting out of bed involving SLR hip flexion movement would bother it. Coughing, sneezing would bother R hip. Had C section 7 years ago for triplets. The pain is along the line of the incision.    Currently in Pain? No/denies              [facility] PT Assessment - 11/04/19 1530      Strength  Right Hip Flexion 5/5   seated hip flexion (not SLR hip flexion); no pain   Right Hip Extension 4-/5    Right Hip ABduction 4+/5   no pain   Right Hip ADduction 4+/5   no pain                                PT Education - 11/04/19 1525    Education Details ther-ex    Person(s) Educated Patient    Methods Explanation;Demonstration;Tactile cues;Verbal cues    Comprehension Returned demonstration;Verbalized understanding          Objective  No latex allergies  Has a 10k obstacle course race end of July 2021  MedbridgeAccess Code: [email or web address]    Pt wants to work up to being able to do planks, pushups, sit ups, spider and mountain climbers, jumps  Therapeutic exercise  Jog x 5 minutes at treadmill, speed 4.5   No pain in R hip.   Normal stride length   Seated manually resisted  hip flexion, S/L hip abduction, adduction, prone glute max extension 1-2x each way for R hip  Prone glute max extension 5x3   Discussed POC: continue 1x/week for 8 weeks secondary to progress and per pt input.   Supine SLR R hip flexion with posterior pelvic tilt with PT Assist   5x2     Improved exercise technique, movement at target joints, use of target muscles aftermin tomod verbal, visual, tactile cues.   Response to treatment/ Clinical presentation   Pt demonstrates overall improved R hip pain with worst pain levels stabilizing to 2-3/10. She also demonstrates improved hip flexor, abductor, adductor strength. Still demonstrates R glute max weakness. Pt also currently able to jog at least 5 minutes with a normal stride length without complain of pain. Pt also able to participate in a 10k obstacle race which did not aggravate her R hip (possible L hip compensation involvement as well) but with modifications for certain obstacles based on her subjective reports. Patient making progress towards decreased pain, improve strength and function. Pt however still demonstrates R hip flexor weakness when performing supine straight leg raise, as well as R glute max weakness, difficulty with core strength, and R hip pain and will benefit from continued skilled physical therapy services to address the aforementioned deficits. Slow progress in which weak core strength due to her diastasis recti may play a factor.      PT Short Term Goals - 05/01/19 1149      PT SHORT TERM GOAL #1   Title Patient will be independent with her HEP to decrease pain, improve strength and function.    Baseline Pt has started her HEP (03/19/2019); Pt demonstrates independence with her HEP (05/01/2019)    Time 3    Period Weeks    Status Achieved    Target Date 04/10/19             PT Long Term Goals - 11/04/19 1521      PT LONG TERM GOAL #1   Title Pt will have a decrease in R hip pain to 2/10 or less  at worst to promote ability to run, perform exercises, stand up from a half kneeling position more comfortably.    Baseline 6/10 R hip pain at most (03/19/2019); 2-3/10 at most for the past 7 days (05/01/2019); 2-3/10 at most for the past 7 days (05/12/19), (05/26/2019); 3/10 (07/08/2019); 4/10 at most for the  past 7 days (after workout at her gym and softball)  2-3/10 at most for the past 7 days. (11/04/2019)    Time 8    Period Weeks    Status Partially Met    Target Date 01/01/20      PT LONG TERM GOAL #2   Title Patient will improve R hip flexion, extension, abduction, and adduction strength by at least 1/2 MMT grade to promote ability to perform functional tasks more comfortably.    Baseline improved by 1/2 MMT grade but pain with hip adduction (05/26/2019); maintained except hip extension (07/08/2019); impmroved strength except hip extension (11/04/2019)    Time 8    Period Weeks    Status Partially Met    Target Date 01/01/20      PT LONG TERM GOAL #3   Title Pt will be able to jog for at least 5 minutes without R hip pain to promote ability to participate in exercise and promote physical health    Baseline increased R hip pain when jogging or running (03/19/2019); able to perform mini jog with short stride length (05/01/2019), (05/26/2019); Able to jog about 5 min except for this past Monday (07/08/2019); Pt states being able to jog, but does not know if it is painless (09/01/2019); able to jog at treadmil speed 4.5 normal stride length, no pain (11/04/2019)    Time 6    Period Weeks    Status Achieved                 Plan - 11/04/19 1526    Clinical Impression Statement Pt demonstrates overall improved R hip pain with worst pain levels stabilizing to 2-3/10. She also demonstrates improved hip flexor, abductor, adductor strength. Still demonstrates R glute max weakness. Pt also currently able to jog at least 5 minutes with a normal stride length without complain of pain. Pt also able to  participate in a 10k obstacle race which did not aggravate her R hip (possible L hip compensation involvement as well) but with modifications for certain obstacles based on her subjective reports. Patient making progress towards decreased pain, improve strength and function. Pt however still demonstrates R hip flexor weakness when performing supine straight leg raise, as well as R glute max weakness, difficulty with core strength, and R hip pain and will benefit from continued skilled physical therapy services to address the aforementioned deficits. Slow progress in which weak core strength due to her diastasis recti may play a factor.    Personal Factors and Comorbidities Comorbidity 1    Comorbidities hx of C-section    Examination-Activity Limitations Squat    Stability/Clinical Decision Making Stable/Uncomplicated    Clinical Decision Making Low    Rehab Potential Good    PT Frequency 1x / week    PT Duration 8 weeks    PT Treatment/Interventions Therapeutic activities;Therapeutic exercise;Neuromuscular re-education;Patient/family education;Manual techniques;Dry needling;Spinal Manipulations;Joint Manipulations;Aquatic Therapy;Electrical Stimulation;Iontophoresis 78m/ml Dexamethasone;Ultrasound    PT Next Visit Plan isometric, then concentric, then eccentric hip and abdominal strengthening, manual techniques, modalities PRN    PT Home Exercise Plan Medbridge Access Code: [email or web address]    Consulted and Agree with Plan of Care Patient           Patient will benefit from skilled therapeutic intervention in order to improve the following deficits and impairments:  Pain, Improper body mechanics, Postural dysfunction, Decreased strength  Visit Diagnosis: Pain in right hip - Plan: PT plan of care cert/re-cert  Muscle weakness (generalized) - Plan: PT plan  of care cert/re-cert     Problem List Patient Active Problem List   Diagnosis Date Noted  . S/P cesarean section - triplets 11/29 02/17/2012   . Postpartum care following cesarean delivery (11/29) 02/17/2012  . Rh negative, maternal 02/17/2012  . 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 Routine general medical examination at a health care facility 06/23/2010  . FIBROCYSTIC BREAST DISEASE 07/17/2008  . Asthma, 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    Joneen Boers PT, DPT   11/04/2019, 6:53 PM  [facility] [address], [place], [address] Phone: [phone]   Fax:  [phone]  Name: ALEEYAH BENSEN MRN: [id number] Date of Birth: [birth year]

## 2019-11-05 NOTE — Addendum Note (Signed)
Addended by: Rosamaria Lints on: 11/05/2019 12:30 PM   Modules accepted: Orders

## 2019-11-10 ENCOUNTER — Ambulatory Visit: Payer: BC Managed Care – PPO

## 2019-11-10 ENCOUNTER — Other Ambulatory Visit: Payer: Self-pay

## 2019-11-10 DIAGNOSIS — M6281 Muscle weakness (generalized): Secondary | ICD-10-CM | POA: Diagnosis not present

## 2019-11-10 DIAGNOSIS — M25551 Pain in right hip: Secondary | ICD-10-CM

## 2019-11-10 NOTE — Therapy (Signed)
[facility] [address], [place], [address] Phone: [phone]   Fax:  [phone]  Physical Therapy Treatment  Patient Details  Name: Nicole Owens MRN: [id number] Date of Birth: 09/23/1980 Referring Provider (PT): Lynne Leader, MD   Encounter Date: 11/10/2019   PT End of Session - 11/10/19 1034    Visit Number 37    Number of Visits 45    Date for PT Re-Evaluation 01/01/20    PT Start Time 1660    PT Stop Time 1121    PT Time Calculation (min) 46 min    Activity Tolerance Patient tolerated treatment well;No increased pain    Behavior During Therapy WFL for tasks assessed/performed           Past Medical History:  Diagnosis Date  . Asthma   . Asthma, intermittent    mild  . Carpal tunnel syndrome on both sides    with pregnancy  . Female infertility 04/30/2010   history restored after error with record merge  . Heartburn in pregnancy    nausea  . 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 Postpartum care following cesarean delivery 02/17/2012  . Rh negative, maternal 02/17/2012  . S/P cesarean section - triplets 11/29 02/17/2012    Past Surgical History:  Procedure Laterality Date  . CESAREAN SECTION  02/16/2012   Procedure: CESAREAN SECTION;  Surgeon: Lovenia Kim, MD;  Location: [facility];  Service: Obstetrics;  Laterality: N/A;  . NO PAST SURGERIES    . WISDOM TOOTH EXTRACTION      There were no vitals filed for this visit.   Subjective Assessment - 11/10/19 1036    Subjective R hip is ok, 2/10 currently. Has not hurt as much. Backwards lunges still bothers her.    Pertinent History R hip pain. Pain began mid November 2020. Pt was at the gym. Pt was performing scissor kicks. Felt a little pain more than normal. Went for a backward lunge with R LE and felt sharp pain R lower abdomen. Took 10 days off and iced that area. Did not really get better. Was diagnosed with sports hernia. Still  getting pain but getting a little better. Could not jog before 6 weeks ago. Thinks she can jog slowly now. Using her R LE to stand up from a R half kneeling position bothers her. Pt goes to the gym 6 days a week before, no goes to the gym 1 time a week and performs modified upper body work out. Pivoting the wrong way sometimes would hurt. Going up the steps 2 at a time bothers her R hip. Getting out of bed involving SLR hip flexion movement would bother it. Coughing, sneezing would bother R hip. Had C section 7 years ago for triplets. The pain is along the line of the incision.    Currently in Pain? Yes    Pain Score 2                                     PT Education - 11/10/19 1107    Education Details ther-ex    Person(s) Educated Patient    Methods Explanation;Demonstration;Tactile cues;Verbal cues    Comprehension Returned demonstration;Verbalized understanding           Objectives  No latex allergies  MedbridgeAccess Code: [email or web address]  Pt wants to work up to being able to do  planks, pushups, sit ups, spider and mountain climbers, jumps   Therapeutic exercise  Supine SLR R hip flexion with posterior pelvic tile and transversus abdominis contraction 5x3 with PT assist  Difficult  Supine posterior pelvic tilt 10x10 seconds  Supine with transversus abdominis contraction  marches 20x each LE  Hip fallouts 10x each LE  R hip soreness afterwards  Seated hip adduction pillow squeeze 10x10 seconds for 2 sets, pain free level of effort to promote proper stress to affected muscles  Decreased R hip soreness  Seated R hip flexion isometrics 10x10 seconds for 2 sets   Improved exercise technique, movement at target joints, use of target muscles after mod verbal, visual, tactile cues.      Manual therapy  Prone STM R lumbar paraspinal and quadratus lumborum mucles to decrease tension   Supine STM R TFL and vastus lateralis to decrease tension      Response to treatment Good muscle use felt with exercises.   Clinical presentation Continued working on improving R hip flexor and transversus abdominis muscle strength to promote ability to raise her R thigh with less difficulty. Slight R hip soreness after hip fallouts which decreased with comfortable isometric tension to hip adductor muscles. Pt will benefit from continued skilled physical therapy services to decrease pain, improve strength and function.       PT Short Term Goals - 05/01/19 1149      PT SHORT TERM GOAL #1   Title Patient will be independent with her HEP to decrease pain, improve strength and function.    Baseline Pt has started her HEP (03/19/2019); Pt demonstrates independence with her HEP (05/01/2019)    Time 3    Period Weeks    Status Achieved    Target Date 04/10/19             PT Long Term Goals - 11/05/19 1220      PT LONG TERM GOAL #1   Title Pt will have a decrease in R hip pain to 2/10 or less at worst to promote ability to run, perform exercises, stand up from a half kneeling position more comfortably.    Baseline 6/10 R hip pain at most (03/19/2019); 2-3/10 at most for the past 7 days (05/01/2019); 2-3/10 at most for the past 7 days (05/12/19), (05/26/2019); 3/10 (07/08/2019); 4/10 at most for the past 7 days (after workout at her gym and softball)  2-3/10 at most for the past 7 days. (11/04/2019)    Time 8    Period Weeks    Status On-going    Target Date 01/01/20      PT LONG TERM GOAL #2   Title Patient will improve R hip flexion, extension, abduction, and adduction strength by at least 1/2 MMT grade to promote ability to perform functional tasks more comfortably.    Baseline improved by 1/2 MMT grade but pain with hip adduction (05/26/2019); maintained except hip extension (07/08/2019); impmroved strength except hip extension (11/04/2019)    Time 8    Period Weeks    Status Partially Met    Target Date 01/01/20      PT LONG TERM GOAL #3    Title Pt will be able to jog for at least 5 minutes without R hip pain to promote ability to participate in exercise and promote physical health    Baseline increased R hip pain when jogging or running (03/19/2019); able to perform mini jog with short stride length (05/01/2019), (05/26/2019); Able to jog about  5 min except for this past Monday (07/08/2019); Pt states being able to jog, but does not know if it is painless (09/01/2019); able to jog at treadmil speed 4.5 normal stride length, no pain (11/04/2019)    Time 6    Period Weeks    Status Achieved    Target Date 10/16/19                 Plan - 11/10/19 1115    Clinical Impression Statement Continued working on improving R hip flexor and transversus abdominis muscle strength to promote ability to raise her R thigh with less difficulty. Slight R hip soreness after hip fallouts which decreased with comfortable isometric tension to hip adductor muscles. Pt will benefit from continued skilled physical therapy services to decrease pain, improve strength and function.    Personal Factors and Comorbidities Comorbidity 1    Comorbidities hx of C-section    Examination-Activity Limitations Squat    Stability/Clinical Decision Making Stable/Uncomplicated    Rehab Potential Good    PT Frequency 1x / week    PT Duration 8 weeks    PT Treatment/Interventions Therapeutic activities;Therapeutic exercise;Neuromuscular re-education;Patient/family education;Manual techniques;Dry needling;Spinal Manipulations;Joint Manipulations;Aquatic Therapy;Electrical Stimulation;Iontophoresis 32m/ml Dexamethasone;Ultrasound    PT Next Visit Plan isometric, then concentric, then eccentric hip and abdominal strengthening, manual techniques, modalities PRN    PT Home Exercise Plan Medbridge Access Code: [email or web address]    Consulted and Agree with Plan of Care Patient           Patient will benefit from skilled therapeutic intervention in order to improve the following  deficits and impairments:  Pain, Improper body mechanics, Postural dysfunction, Decreased strength  Visit Diagnosis: Pain in right hip  Muscle weakness (generalized)     Problem List Patient Active Problem List   Diagnosis Date Noted  . S/P cesarean section - triplets 11/29 02/17/2012  . Postpartum care following cesarean delivery (11/29) 02/17/2012  . Rh negative, maternal 02/17/2012  . 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 Routine general medical examination at a health care facility 06/23/2010  . FIBROCYSTIC BREAST DISEASE 07/17/2008  . Asthma, 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    MJoneen BoersPT, DPT   11/10/2019, 11:41 AM  [facility] [address] [place] [address]Phone: [phone]  Fax:  [phone] Name: NTASHAWNA THOMMRN: [id number] of Birth: [birth year]

## 2019-11-11 ENCOUNTER — Other Ambulatory Visit: Payer: Self-pay | Admitting: Internal Medicine

## 2019-11-12 ENCOUNTER — Other Ambulatory Visit: Payer: Self-pay | Admitting: Internal Medicine

## 2019-11-13 ENCOUNTER — Ambulatory Visit: Payer: BC Managed Care – PPO

## 2019-11-18 ENCOUNTER — Ambulatory Visit: Payer: BC Managed Care – PPO

## 2019-11-18 ENCOUNTER — Other Ambulatory Visit: Payer: Self-pay

## 2019-11-18 DIAGNOSIS — M6281 Muscle weakness (generalized): Secondary | ICD-10-CM

## 2019-11-18 DIAGNOSIS — M25551 Pain in right hip: Secondary | ICD-10-CM | POA: Diagnosis not present

## 2019-11-18 NOTE — Therapy (Signed)
[facility] [address] [place], [address] Phone: [phone]   Fax:  [phone]  Physical Therapy Treatment  Patient Details  Name: Nicole Owens MRN: [id number] Date of Birth: 04-16-1980 Referring Provider (PT): Lynne Leader, MD   Encounter Date: 11/18/2019   PT End of Session - 11/18/19 0822    Visit Number 38    Number of Visits 45    Date for PT Re-Evaluation 01/01/20    PT Start Time 0822    PT Stop Time 0907    PT Time Calculation (min) 45 min    Activity Tolerance Patient tolerated treatment well;No increased pain    Behavior During Therapy WFL for tasks assessed/performed           Past Medical History:  Diagnosis Date  . Asthma   . Asthma, intermittent    mild  . Carpal tunnel syndrome on both sides    with pregnancy  . Female infertility 04/30/2010   history restored after error with record merge  . Heartburn in pregnancy    nausea  . 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 Postpartum care following cesarean delivery 02/17/2012  . Rh negative, maternal 02/17/2012  . S/P cesarean section - triplets 11/29 02/17/2012    Past Surgical History:  Procedure Laterality Date  . CESAREAN SECTION  02/16/2012   Procedure: CESAREAN SECTION;  Surgeon: Lovenia Kim, MD;  Location: [facility];  Service: Obstetrics;  Laterality: N/A;  . NO PAST SURGERIES    . WISDOM TOOTH EXTRACTION      There were no vitals filed for this visit.   Subjective Assessment - 11/18/19 0823    Subjective R hip feels pretty good.    Pertinent History R hip pain. Pain began mid November 2020. Pt was at the gym. Pt was performing scissor kicks. Felt a little pain more than normal. Went for a backward lunge with R LE and felt sharp pain R lower abdomen. Took 10 days off and iced that area. Did not really get better. Was diagnosed with sports hernia. Still getting pain but getting a little better. Could not jog  before 6 weeks ago. Thinks she can jog slowly now. Using her R LE to stand up from a R half kneeling position bothers her. Pt goes to the gym 6 days a week before, no goes to the gym 1 time a week and performs modified upper body work out. Pivoting the wrong way sometimes would hurt. Going up the steps 2 at a time bothers her R hip. Getting out of bed involving SLR hip flexion movement would bother it. Coughing, sneezing would bother R hip. Had C section 7 years ago for triplets. The pain is along the line of the incision.    Currently in Pain? No/denies    Pain Score 0-No pain                                     PT Education - 11/18/19 0855    Education Details ther-ex    Person(s) Educated Patient    Methods Explanation;Demonstration;Tactile cues;Verbal cues    Comprehension Returned demonstration;Verbalized understanding            Objectives  No latex allergies  MedbridgeAccess Code: [email or web address]  Pt wants to work up to being able to do planks, pushups, sit ups, spider and mountain climbers, jumps  Therapeutic exercise  Supine R hip IR stretch 1 min x 3  Supine long axis distraction R hip   No change in R hip flexor strength  Supine SLR R hip flexion with posterior pelvic tilt and transversus abdominis contraction 5x3 with pillow under pelvis to promote posterior pelvic tilt.   Improved ability to perform with assisted posterior pelvic tilt  hooklying alternating leg extension with transversus abdominis contraction and pillow under pelvis to promote posterior pelvic tilt.5x4 each LE  hooklying hip fallout with transversus abdominis contraction and pillow under pelvis to promote posterior pelvic tilt.5x2 each LE. Cramp then without pillow under pelvis 5x2  Standing back extension 10x2 (to decrease R sciatic symptoms)  Prone glute max extension R 10x2    Improved exercise technique, movement at target joints, use of target muscles  after min to mod verbal, visual, tactile cues.      Manual therapy  Supine STM R hip flexor muscle to decrease tension      Response to treatment Good muscle use felt with exercises.   Clinical presentation Improved R hip flexor muscle strength observed with supine SLR hip flexion with assisted posterior pelvic tilt, suggesting core strength component with hip weakness. Continued working on transversus abdominis strength and control to help address. Pt also demonstrates sciatic symptoms with too much lumbar flexion position which is alleviated with lumbar extension. Pt was recommended to perform equal parts posterior pelvic tilt and lumbar extension outside of PT to help address. Pt tolerated session well without aggravation of symptoms. Pt will benefit from continued skilled physical therapy services to decrease pain, improve strength and function.       PT Short Term Goals - 05/01/19 1149      PT SHORT TERM GOAL #1   Title Patient will be independent with her HEP to decrease pain, improve strength and function.    Baseline Pt has started her HEP (03/19/2019); Pt demonstrates independence with her HEP (05/01/2019)    Time 3    Period Weeks    Status Achieved    Target Date 04/10/19             PT Long Term Goals - 11/05/19 1220      PT LONG TERM GOAL #1   Title Pt will have a decrease in R hip pain to 2/10 or less at worst to promote ability to run, perform exercises, stand up from a half kneeling position more comfortably.    Baseline 6/10 R hip pain at most (03/19/2019); 2-3/10 at most for the past 7 days (05/01/2019); 2-3/10 at most for the past 7 days (05/12/19), (05/26/2019); 3/10 (07/08/2019); 4/10 at most for the past 7 days (after workout at her gym and softball)  2-3/10 at most for the past 7 days. (11/04/2019)    Time 8    Period Weeks    Status On-going    Target Date 01/01/20      PT LONG TERM GOAL #2   Title Patient will improve R hip flexion,  extension, abduction, and adduction strength by at least 1/2 MMT grade to promote ability to perform functional tasks more comfortably.    Baseline improved by 1/2 MMT grade but pain with hip adduction (05/26/2019); maintained except hip extension (07/08/2019); impmroved strength except hip extension (11/04/2019)    Time 8    Period Weeks    Status Partially Met    Target Date 01/01/20      PT LONG TERM GOAL #3   Title Pt  will be able to jog for at least 5 minutes without R hip pain to promote ability to participate in exercise and promote physical health    Baseline increased R hip pain when jogging or running (03/19/2019); able to perform mini jog with short stride length (05/01/2019), (05/26/2019); Able to jog about 5 min except for this past Monday (07/08/2019); Pt states being able to jog, but does not know if it is painless (09/01/2019); able to jog at treadmil speed 4.5 normal stride length, no pain (11/04/2019)    Time 6    Period Weeks    Status Achieved    Target Date 10/16/19                 Plan - 11/18/19 0856    Clinical Impression Statement Improved R hip flexor muscle strength observed with supine SLR hip flexion with assisted posterior pelvic tilt, suggesting core strength component with hip weakness. Continued working on transversus abdominis strength and control to help address. Pt also demonstrates sciatic symptoms with too much lumbar flexion position which is alleviated with lumbar extension. Pt was recommended to perform equal parts posterior pelvic tilt and lumbar extension outside of PT to help address. Pt tolerated session well without aggravation of symptoms. Pt will benefit from continued skilled physical therapy services to decrease pain, improve strength and function.    Personal Factors and Comorbidities Comorbidity 1    Comorbidities hx of C-section    Examination-Activity Limitations Squat    Stability/Clinical Decision Making Stable/Uncomplicated    Rehab  Potential Good    PT Frequency 1x / week    PT Duration 8 weeks    PT Treatment/Interventions Therapeutic activities;Therapeutic exercise;Neuromuscular re-education;Patient/family education;Manual techniques;Dry needling;Spinal Manipulations;Joint Manipulations;Aquatic Therapy;Electrical Stimulation;Iontophoresis 15m/ml Dexamethasone;Ultrasound    PT Next Visit Plan isometric, then concentric, then eccentric hip and abdominal strengthening, manual techniques, modalities PRN    PT Home Exercise Plan Medbridge Access Code: [email or web address]    Consulted and Agree with Plan of Care Patient           Patient will benefit from skilled therapeutic intervention in order to improve the following deficits and impairments:  Pain, Improper body mechanics, Postural dysfunction, Decreased strength  Visit Diagnosis: Pain in right hip  Muscle weakness (generalized)     Problem List Patient Active Problem List   Diagnosis Date Noted  . S/P cesarean section - triplets 11/29 02/17/2012  . Postpartum care following cesarean delivery (11/29) 02/17/2012  . Rh negative, maternal 02/17/2012  . 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 Routine general medical examination at a health care facility 06/23/2010  . FIBROCYSTIC BREAST DISEASE 07/17/2008  . Asthma, 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    MJoneen BoersPT, DPT   11/18/2019, 9:16 AM  [facility] [address] [place] [address]Phone: [phone]  Fax:  [phone] Name: Nicole KAPLANMRN: [id number] of Birth: [birth year]

## 2019-11-18 NOTE — Patient Instructions (Signed)
Pt was recommended to perform equal parts posterior pelvic tilt, equal parts back extension for hip flexion strength and sciatic nerve symptoms. Pt demonstrated and verbalized understanding.

## 2019-11-19 ENCOUNTER — Telehealth: Payer: Self-pay | Admitting: Internal Medicine

## 2019-11-19 MED ORDER — ALBUTEROL SULFATE HFA 108 (90 BASE) MCG/ACT IN AERS: INHALATION_SPRAY | RESPIRATORY_TRACT | 2 refills | Status: DC

## 2019-11-19 NOTE — Telephone Encounter (Signed)
Spoke with the pt  She does not want to take [facility]  She states that this does not work for her well  She wants to know if she can have the Symbicort 160 and just take 1 puff bid since this dose is covered  Please advise thanks

## 2019-11-19 NOTE — Telephone Encounter (Signed)
Yeah that is fine 

## 2019-11-19 NOTE — Telephone Encounter (Signed)
She can try to lower dose dulera 100/5, 2 puff twice daily

## 2019-11-19 NOTE — Telephone Encounter (Signed)
Spoke with patient, she states she did not get her meds from [facility] pharmacy when they were sent electronically on 10/08/19 (Albuterol and Symbicort).  Called [facility] and confirmed electronic script was not received.  Albuterol sent without any problem.  As I was sending the Symbicort 80/4.5, a pop up box appeared stating that it was not a preferred medication and to choose one of the following:  Advair diskus 100-50, 200-50, 500-50 Fluticone-salmeterol 55-14, 113-14, 232-14 Advair HFA 230-21 Symbicort 160-4.5 Dulera 100-5, 200-5 Combivent respimet 20-100 Breo Ellipta 100-25 Anoro Ellipta 62.5-25 Stioloto respimat 2.5-25 [facility] ellipta 200-25 Trelegy Ellepta 100-62.5-25 Dulera 50-5 Breztri aerosphere 160-9-4.8 Trelegy ellipta 200-62.5-25  Dr. Marchelle Gearing, please advise.  Thank you.

## 2019-11-20 NOTE — Telephone Encounter (Signed)
Spoke with patient this morning that it was ok per [facility] to send in new script into her pharmacy for Symbicort 160 per her preferred list of medications her insurance would cover Symbicort 80/4.5. Is it ok to send in the original script to her pharmacy. Dr.Ramaswamy can you please advise. Thank you

## 2019-11-20 NOTE — Telephone Encounter (Signed)
Yes okay to do Symbicort 80/4.52 puff twice daily as originally prescribed

## 2019-11-21 MED ORDER — ALBUTEROL SULFATE HFA 108 (90 BASE) MCG/ACT IN AERS: INHALATION_SPRAY | RESPIRATORY_TRACT | 3 refills | Status: DC

## 2019-11-21 MED ORDER — BUDESONIDE-FORMOTEROL FUMARATE 80-4.5 MCG/ACT IN AERO: 2.0000 | INHALATION_SPRAY | Freq: Two times a day (BID) | RESPIRATORY_TRACT | 3 refills | Status: DC

## 2019-11-21 NOTE — Telephone Encounter (Signed)
Called and spoke with pt letting her know the info stated by MR that we were going to send Rx for Symbicort 80 to pharmacy for her. Pt verbalized understanding. Pt also requested to have rescue inhaler to be resent to pharmacy and to specify ventolin. Rx has been sent and Ventolin  HFA was specified. Nothing further needed.

## 2019-11-24 DIAGNOSIS — J029 Acute pharyngitis, unspecified: Secondary | ICD-10-CM | POA: Diagnosis not present

## 2019-11-25 DIAGNOSIS — Z124 Encounter for screening for malignant neoplasm of cervix: Secondary | ICD-10-CM | POA: Diagnosis not present

## 2019-11-25 DIAGNOSIS — Z6829 Body mass index (BMI) 29.0-29.9, adult: Secondary | ICD-10-CM | POA: Diagnosis not present

## 2019-11-25 DIAGNOSIS — Z01419 Encounter for gynecological examination (general) (routine) without abnormal findings: Secondary | ICD-10-CM | POA: Diagnosis not present

## 2019-11-25 DIAGNOSIS — Z1151 Encounter for screening for human papillomavirus (HPV): Secondary | ICD-10-CM | POA: Diagnosis not present

## 2019-11-25 LAB — HM PAP SMEAR: HM Pap smear: NEGATIVE

## 2019-11-25 LAB — RESULTS CONSOLE HPV: CHL HPV: NEGATIVE

## 2019-11-26 ENCOUNTER — Ambulatory Visit: Payer: BC Managed Care – PPO | Attending: Family Medicine

## 2019-11-26 ENCOUNTER — Other Ambulatory Visit: Payer: Self-pay

## 2019-11-26 DIAGNOSIS — M6281 Muscle weakness (generalized): Secondary | ICD-10-CM

## 2019-11-26 DIAGNOSIS — M25551 Pain in right hip: Secondary | ICD-10-CM | POA: Insufficient documentation

## 2019-11-26 NOTE — Therapy (Signed)
[facility] [address], [place], [address] Phone: [phone]   Fax:  [phone]  Physical Therapy Treatment  Patient Details  Name: Nicole Owens MRN: [id number] Date of Birth: October 22, 1980 Referring Provider (PT): Lynne Leader, MD   Encounter Date: 11/26/2019   PT End of Session - 11/26/19 1516    Visit Number 39    Number of Visits 45    Date for PT Re-Evaluation 01/01/20    PT Start Time 1655    PT Stop Time 1625    PT Time Calculation (min) 68 min    Activity Tolerance Patient tolerated treatment well;No increased pain    Behavior During Therapy [facility] for tasks assessed/performed           Past Medical History:  Diagnosis Date   Asthma    Asthma, intermittent    mild   Carpal tunnel syndrome on both sides    with pregnancy   Female infertility 04/30/2010   history restored after error with record merge   Heartburn in pregnancy    nausea   Multiple gestation - triplets 08/10/2011   Postpartum care following cesarean delivery 02/17/2012   Rh negative, maternal 02/17/2012   S/P cesarean section - triplets 11/29 02/17/2012    Past Surgical History:  Procedure Laterality Date   CESAREAN SECTION  02/16/2012   Procedure: CESAREAN SECTION;  Surgeon: Lovenia Kim, MD;  Location: [facility];  Service: Obstetrics;  Laterality: N/A;   NO PAST SURGERIES     WISDOM TOOTH EXTRACTION      There were no vitals filed for this visit.   Subjective Assessment - 11/26/19 1517    Subjective R hip feels pretty good right now. No pain currently.    Pertinent History R hip pain. Pain began mid November 2020. Pt was at the gym. Pt was performing scissor kicks. Felt a little pain more than normal. Went for a backward lunge with R LE and felt sharp pain R lower abdomen. Took 10 days off and iced that area. Did not really get better. Was diagnosed with sports hernia. Still getting pain but getting a little  better. Could not jog before 6 weeks ago. Thinks she can jog slowly now. Using her R LE to stand up from a R half kneeling position bothers her. Pt goes to the gym 6 days a week before, no goes to the gym 1 time a week and performs modified upper body work out. Pivoting the wrong way sometimes would hurt. Going up the steps 2 at a time bothers her R hip. Getting out of bed involving SLR hip flexion movement would bother it. Coughing, sneezing would bother R hip. Had C section 7 years ago for triplets. The pain is along the line of the incision.    Currently in Pain? No/denies    Pain Score 0-No pain                                     PT Education - 11/26/19 1535    Education Details ther-ex    Person(s) Educated Patient    Methods Explanation;Demonstration;Tactile cues;Verbal cues    Comprehension Returned demonstration;Verbalized understanding          Objectives  No latex allergies  MedbridgeAccess Code: [email or web address]  Pt wants to work up to being able to do planks, pushups, sit ups, spider and  mountain climbers, jumps   Therapeutic exercise  Supine posterior pelvic tilt 20 seconds x 5   hooklying hip fallout with transversus abdominis contraction no pillow under pelvis 10 x each LE   Standing back extension 10x 5 seconds  Supine SLR R hip flexion with posterior pelvic tilt and transversus abdominis contraction 5x with pillow under pelvis to promote posterior pelvic tilt.   Supine R hip extension isometrics in SKTC position, PT manual resistance 10x5 seconds   Supine bridge 10x5 seconds for 2 sets to promote glute max muscle strengthening  Bridge with L hip march 5x2. Challenging  Standing hip flexion R LE yellow band L UE assist 5x3  Standing hip adduction R LE yellow band 10x3 with B UE assist   Standing hip extension R LE yellow band 10x3  Static forward lunge L LE 10x (similar to backward lunge with R LE) with R UE assist  Static  backward lunge R LE 10x with R UE assist   Standing L lateral shift correction at wall 10x5 seconds for 2 sets  Decreased discomfort around R L2 dermatome  Pt education on muscle fibers and strength in detail using diagrams and visual aids.    Try low back exercises for R L 2 dermatome like symptoms next visit if appropriate     Improved exercise technique, movement at target joints, use of target muscles after min to mod verbal, visual, tactile cues.       Response to treatment Good muscle use felt with exercises. Pt tolerated session well without aggravation of symptoms.   Clinical presentation Able to perform exercises without complain of R hip pain. Mainly fatigue. Continued working on core strengthening (transverus abdominis contraction with posterior pelvic tilt) to promote a more efficient position of R hip flexor muscles to contract with the addition of hip flexor, adductor, and glute max strengthening. Pt tolerated session well without aggravation of symptoms. Pt will benefit from continued skilled physical therapy services to decrease pain, improve strength and function.      PT Short Term Goals - 05/01/19 1149      PT SHORT TERM GOAL #1   Title Patient will be independent with her HEP to decrease pain, improve strength and function.    Baseline Pt has started her HEP (03/19/2019); Pt demonstrates independence with her HEP (05/01/2019)    Time 3    Period Weeks    Status Achieved    Target Date 04/10/19             PT Long Term Goals - 11/05/19 1220      PT LONG TERM GOAL #1   Title Pt will have a decrease in R hip pain to 2/10 or less at worst to promote ability to run, perform exercises, stand up from a half kneeling position more comfortably.    Baseline 6/10 R hip pain at most (03/19/2019); 2-3/10 at most for the past 7 days (05/01/2019); 2-3/10 at most for the past 7 days (05/12/19), (05/26/2019); 3/10 (07/08/2019); 4/10 at most for the past 7  days (after workout at her gym and softball)  2-3/10 at most for the past 7 days. (11/04/2019)    Time 8    Period Weeks    Status On-going    Target Date 01/01/20      PT LONG TERM GOAL #2   Title Patient will improve R hip flexion, extension, abduction, and adduction strength by at least 1/2 MMT grade to promote ability to perform functional tasks  more comfortably.    Baseline improved by 1/2 MMT grade but pain with hip adduction (05/26/2019); maintained except hip extension (07/08/2019); impmroved strength except hip extension (11/04/2019)    Time 8    Period Weeks    Status Partially Met    Target Date 01/01/20      PT LONG TERM GOAL #3   Title Pt will be able to jog for at least 5 minutes without R hip pain to promote ability to participate in exercise and promote physical health    Baseline increased R hip pain when jogging or running (03/19/2019); able to perform mini jog with short stride length (05/01/2019), (05/26/2019); Able to jog about 5 min except for this past Monday (07/08/2019); Pt states being able to jog, but does not know if it is painless (09/01/2019); able to jog at treadmil speed 4.5 normal stride length, no pain (11/04/2019)    Time 6    Period Weeks    Status Achieved    Target Date 10/16/19                 Plan - 11/26/19 1539    Clinical Impression Statement Able to perform exercises without complain of R hip pain. Mainly fatigue. Continued working on core strengthening (transverus abdominis contraction with posterior pelvic tilt) to promote a more efficient position of R hip flexor muscles to contract with the addition of hip flexor, adductor, and glute max strengthening. Pt tolerated session well without aggravation of symptoms. Pt will benefit from continued skilled physical therapy services to decrease pain, improve strength and function.    Personal Factors and Comorbidities Comorbidity 1    Comorbidities hx of C-section    Examination-Activity Limitations  Squat    Stability/Clinical Decision Making Stable/Uncomplicated    Rehab Potential Good    PT Frequency 1x / week    PT Duration 8 weeks    PT Treatment/Interventions Therapeutic activities;Therapeutic exercise;Neuromuscular re-education;Patient/family education;Manual techniques;Dry needling;Spinal Manipulations;Joint Manipulations;Aquatic Therapy;Electrical Stimulation;Iontophoresis 11m/ml Dexamethasone;Ultrasound    PT Next Visit Plan isometric, then concentric, then eccentric hip and abdominal strengthening, manual techniques, modalities PRN    PT Home Exercise Plan Medbridge Access Code: [email or web address]    Consulted and Agree with Plan of Care Patient           Patient will benefit from skilled therapeutic intervention in order to improve the following deficits and impairments:  Pain, Improper body mechanics, Postural dysfunction, Decreased strength  Visit Diagnosis: Pain in right hip  Muscle weakness (generalized)     Problem List Patient Active Problem List   Diagnosis Date Noted   S/P cesarean section - triplets 11/29 02/17/2012   Postpartum care following cesarean delivery (11/29) 02/17/2012   Rh negative, maternal 02/17/2012   Multiple gestation - triplets 08/10/2011   Routine general medical examination at a health care facility 06/23/2010   FIBROCYSTIC BREAST DISEASE 07/17/2008   Asthma, 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    MJoneen BoersPT, DPT   11/26/2019, 4:44 PM  [facility] [address] [place] [address]Phone: [phone]  Fax:  [phone] Name: Nicole COLLETMRN: [id number] of Birth: [birth year]

## 2019-12-03 ENCOUNTER — Ambulatory Visit: Payer: BC Managed Care – PPO

## 2019-12-03 ENCOUNTER — Other Ambulatory Visit: Payer: Self-pay

## 2019-12-03 DIAGNOSIS — M25551 Pain in right hip: Secondary | ICD-10-CM

## 2019-12-03 DIAGNOSIS — M6281 Muscle weakness (generalized): Secondary | ICD-10-CM

## 2019-12-03 NOTE — Therapy (Signed)
[facility] [address], [place], [address] Phone: [phone]   Fax:  [phone]  Physical Therapy Treatment  Patient Details  Name: MAANVI LECOMPTE MRN: [id number] Date of Birth: 1980-09-08 Referring Provider (PT): Lynne Leader, MD   Encounter Date: 12/03/2019   PT End of Session - 12/03/19 1020    Visit Number 40    Number of Visits 45    Date for PT Re-Evaluation 01/01/20    PT Start Time 1020    PT Stop Time 1103    PT Time Calculation (min) 43 min    Activity Tolerance Patient tolerated treatment well;No increased pain    Behavior During Therapy WFL for tasks assessed/performed           Past Medical History:  Diagnosis Date  . Asthma   . Asthma, intermittent    mild  . Carpal tunnel syndrome on both sides    with pregnancy  . Female infertility 04/30/2010   history restored after error with record merge  . Heartburn in pregnancy    nausea  . 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 Postpartum care following cesarean delivery 02/17/2012  . Rh negative, maternal 02/17/2012  . S/P cesarean section - triplets 11/29 02/17/2012    Past Surgical History:  Procedure Laterality Date  . CESAREAN SECTION  02/16/2012   Procedure: CESAREAN SECTION;  Surgeon: Lovenia Kim, MD;  Location: [facility];  Service: Obstetrics;  Laterality: N/A;  . NO PAST SURGERIES    . WISDOM TOOTH EXTRACTION      There were no vitals filed for this visit.   Subjective Assessment - 12/03/19 1021    Subjective R hip is good. It only hurts when she is doing something strenous such as at the gym. Its not like an every day pain now.,    Pertinent History R hip pain. Pain began mid November 2020. Pt was at the gym. Pt was performing scissor kicks. Felt a little pain more than normal. Went for a backward lunge with R LE and felt sharp pain R lower abdomen. Took 10 days off and iced that area. Did not really get better. Was  diagnosed with sports hernia. Still getting pain but getting a little better. Could not jog before 6 weeks ago. Thinks she can jog slowly now. Using her R LE to stand up from a R half kneeling position bothers her. Pt goes to the gym 6 days a week before, no goes to the gym 1 time a week and performs modified upper body work out. Pivoting the wrong way sometimes would hurt. Going up the steps 2 at a time bothers her R hip. Getting out of bed involving SLR hip flexion movement would bother it. Coughing, sneezing would bother R hip. Had C section 7 years ago for triplets. The pain is along the line of the incision.    Currently in Pain? No/denies    Pain Score 0-No pain                                     PT Education - 12/03/19 1026    Education Details ther-ex    Person(s) Educated Patient    Methods Explanation;Demonstration;Tactile cues;Verbal cues    Comprehension Returned demonstration;Verbalized understanding          Objectives  No latex allergies  MedbridgeAccess Code: [email or web address]  Pt wants to work up to being able to do planks, pushups, sit ups, spider and mountain climbers, jumps   Therapeutic exercise  Supine transversus abdominis contraction and posterior pelvic tilt 30 seconds x 5  hooklying isometric lower trunk rotation R and L with PT manual resistance in neutral 10 seconds x 10 each way for 2 sets  Bridge 30 seconds   Hooklying transversus abdominis contraction   With heel slides R LE without touching table 5x2  With isometric upper trunk rotation in neutral, B shoulders flexion to 90 degrees 10x10 seconds each direction    Then with alternating isometric lower trunk rotation simultaneously in neutral 10x10 seconds each   hooklying hip fallout with transversus abdominis contraction no pillow under pelvis 10 x each LE             emphasis on slower movement to promote more transversus abdominis contraction.   hooklying R LE  straight leg raise with pillow under pelvis to promtoe slight posterior pelvic tilt 5x, then 4x to improve R hip flexor strength   4/10 R hip pain at 4th repetition of 2x set.   Try low back exercises for R L 2 dermatome like symptoms next visit if appropriate     Improved exercise technique, movement at target joints, use of target muscles aftermin tomod verbal, visual, tactile cues.    Response to treatment Good muscle use felt with exercises.   Clinical presentation  Continued working on improving transversus abdominis strength to help decrease diastasis recti and improve core strength for R hip flexor recovery. Fair tolerance to straight leg raise hip flexion today. Overall improving comfort level based on subjective reports. Pt will benefit from continued skilled physical therapy services to decrease pain, improve strength and function.      PT Short Term Goals - 05/01/19 1149      PT SHORT TERM GOAL #1   Title Patient will be independent with her HEP to decrease pain, improve strength and function.    Baseline Pt has started her HEP (03/19/2019); Pt demonstrates independence with her HEP (05/01/2019)    Time 3    Period Weeks    Status Achieved    Target Date 04/10/19             PT Long Term Goals - 11/05/19 1220      PT LONG TERM GOAL #1   Title Pt will have a decrease in R hip pain to 2/10 or less at worst to promote ability to run, perform exercises, stand up from a half kneeling position more comfortably.    Baseline 6/10 R hip pain at most (03/19/2019); 2-3/10 at most for the past 7 days (05/01/2019); 2-3/10 at most for the past 7 days (05/12/19), (05/26/2019); 3/10 (07/08/2019); 4/10 at most for the past 7 days (after workout at her gym and softball)  2-3/10 at most for the past 7 days. (11/04/2019)    Time 8    Period Weeks    Status On-going    Target Date 01/01/20      PT LONG TERM GOAL #2   Title Patient will improve R hip flexion, extension,  abduction, and adduction strength by at least 1/2 MMT grade to promote ability to perform functional tasks more comfortably.    Baseline improved by 1/2 MMT grade but pain with hip adduction (05/26/2019); maintained except hip extension (07/08/2019); impmroved strength except hip extension (11/04/2019)    Time 8    Period Weeks  Status Partially Met    Target Date 01/01/20      PT LONG TERM GOAL #3   Title Pt will be able to jog for at least 5 minutes without R hip pain to promote ability to participate in exercise and promote physical health    Baseline increased R hip pain when jogging or running (03/19/2019); able to perform mini jog with short stride length (05/01/2019), (05/26/2019); Able to jog about 5 min except for this past Monday (07/08/2019); Pt states being able to jog, but does not know if it is painless (09/01/2019); able to jog at treadmil speed 4.5 normal stride length, no pain (11/04/2019)    Time 6    Period Weeks    Status Achieved    Target Date 10/16/19                 Plan - 12/03/19 1027    Clinical Impression Statement Continued working on improving transversus abdominis strength to help decrease diastasis recti and improve core strength for R hip flexor recovery. Fair tolerance to straight leg raise hip flexion today. Overall improving comfort level based on subjective reports. Pt will benefit from continued skilled physical therapy services to decrease pain, improve strength and function.    Personal Factors and Comorbidities Comorbidity 1    Comorbidities hx of C-section    Examination-Activity Limitations Squat    Stability/Clinical Decision Making Stable/Uncomplicated    Rehab Potential Good    PT Frequency 1x / week    PT Duration 8 weeks    PT Treatment/Interventions Therapeutic activities;Therapeutic exercise;Neuromuscular re-education;Patient/family education;Manual techniques;Dry needling;Spinal Manipulations;Joint Manipulations;Aquatic Therapy;Electrical  Stimulation;Iontophoresis 74m/ml Dexamethasone;Ultrasound    PT Next Visit Plan isometric, then concentric, then eccentric hip and abdominal strengthening, manual techniques, modalities PRN    PT Home Exercise Plan Medbridge Access Code: [email or web address]    Consulted and Agree with Plan of Care Patient           Patient will benefit from skilled therapeutic intervention in order to improve the following deficits and impairments:  Pain, Improper body mechanics, Postural dysfunction, Decreased strength  Visit Diagnosis: Pain in right hip  Muscle weakness (generalized)     Problem List Patient Active Problem List   Diagnosis Date Noted  . S/P cesarean section - triplets 11/29 02/17/2012  . Postpartum care following cesarean delivery (11/29) 02/17/2012  . Rh negative, maternal 02/17/2012  . 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 Routine general medical examination at a health care facility 06/23/2010  . FIBROCYSTIC BREAST DISEASE 07/17/2008  . Asthma, 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    MJoneen BoersPT, DPT   12/03/2019, 2:20 PM  [facility] [address] [place] [address]Phone: [phone]  Fax:  [phone] Name: NSATINA JERRELLMRN: [id number] of Birth: [birth year]

## 2019-12-09 DIAGNOSIS — R1031 Right lower quadrant pain: Secondary | ICD-10-CM | POA: Diagnosis not present

## 2019-12-10 ENCOUNTER — Other Ambulatory Visit: Payer: Self-pay

## 2019-12-10 ENCOUNTER — Ambulatory Visit: Payer: BC Managed Care – PPO

## 2019-12-10 DIAGNOSIS — M6281 Muscle weakness (generalized): Secondary | ICD-10-CM | POA: Diagnosis not present

## 2019-12-10 DIAGNOSIS — M25551 Pain in right hip: Secondary | ICD-10-CM | POA: Diagnosis not present

## 2019-12-10 NOTE — Therapy (Signed)
[facility] [address] [place], [address] Phone: [phone]   Fax:  [phone]  Physical Therapy Treatment  Patient Details  Name: Nicole Owens MRN: [id number] Date of Birth: 01-06-1981 Referring Provider (PT): Lynne Leader, MD   Encounter Date: 12/10/2019   PT End of Session - 12/10/19 1416    Visit Number 41    Number of Visits 45    Date for PT Re-Evaluation 01/01/20    PT Start Time 5852    PT Stop Time 7782    PT Time Calculation (min) 42 min    Activity Tolerance Patient tolerated treatment well;No increased pain    Behavior During Therapy WFL for tasks assessed/performed           Past Medical History:  Diagnosis Date  . Asthma   . Asthma, intermittent    mild  . Carpal tunnel syndrome on both sides    with pregnancy  . Female infertility 04/30/2010   history restored after error with record merge  . Heartburn in pregnancy    nausea  . 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 Postpartum care following cesarean delivery 02/17/2012  . Rh negative, maternal 02/17/2012  . S/P cesarean section - triplets 11/29 02/17/2012    Past Surgical History:  Procedure Laterality Date  . CESAREAN SECTION  02/16/2012   Procedure: CESAREAN SECTION;  Surgeon: Lovenia Kim, MD;  Location: [facility];  Service: Obstetrics;  Laterality: N/A;  . NO PAST SURGERIES    . WISDOM TOOTH EXTRACTION      There were no vitals filed for this visit.   Subjective Assessment - 12/10/19 1416    Subjective R hip feel fine. Only hurts when she uses it which better than what it was 10 months ago.    Pertinent History R hip pain. Pain began mid November 2020. Pt was at the gym. Pt was performing scissor kicks. Felt a little pain more than normal. Went for a backward lunge with R LE and felt sharp pain R lower abdomen. Took 10 days off and iced that area. Did not really get better. Was diagnosed with sports hernia.  Still getting pain but getting a little better. Could not jog before 6 weeks ago. Thinks she can jog slowly now. Using her R LE to stand up from a R half kneeling position bothers her. Pt goes to the gym 6 days a week before, no goes to the gym 1 time a week and performs modified upper body work out. Pivoting the wrong way sometimes would hurt. Going up the steps 2 at a time bothers her R hip. Getting out of bed involving SLR hip flexion movement would bother it. Coughing, sneezing would bother R hip. Had C section 7 years ago for triplets. The pain is along the line of the incision.    Currently in Pain? No/denies    Pain Score 0-No pain                                     PT Education - 12/10/19 1422    Education Details ther-ex    Person(s) Educated Patient    Methods Explanation;Demonstration;Tactile cues;Verbal cues    Comprehension Returned demonstration;Verbalized understanding            Objectives  No latex allergies  MedbridgeAccess Code: [email or web address]  Pt wants to work up  to being able to do planks, pushups, sit ups, spider and mountain climbers, jumps  standing posture: slight lateral shift, slight R lower trunk rotation    Therapeutic exercise  Supine transversus abdominis contraction and posterior pelvic tilt 10x10 seconds   Standing L lateral shift correction 10x5 seconds for 3 sets  Pallof press green band to promote core strengthening    L 10x5 seconds, then 10x10 seconds   R 10x5 seconds, then 10x10 seconds   Standing R lateral shift correction 10x3 with 5 second holds  PNF chops to the R to promote more neutral low back posture. Green band 10x3 with 5 second holds   Seated R trunk rotation isometrics in neutral 10x3 with 5 second holds to promote more neutral lumbar posture    No change in supine R hip flexion SLR strength after aforementioned exercises   Supine SLR R hip flexion with PT assist and pillow under pelvis 4x.     hooklying isometric lower trunk rotation R and L with PT manual resistance in neutral 5 seconds x 10 each way      Improved exercise technique, movement at target joints, use of target muscles aftermin tomod verbal, visual, tactile cues.    Response to treatment Good muscle use felt with exercises.   Clinical presentation Worked on improving trunk posture, and core strength. No immediate change in supine SLR hip flexion strength observed. Continued working on hip flexor strengthening as well to improve ability to raise her R thigh up with less difficulty was well as to provide stress to help continue healing process. Pt tolerated session well without aggravation of symptoms. Pt will benefit from continued skilled physical therapy services to decrease pain, improve strength and function.          PT Short Term Goals - 05/01/19 1149      PT SHORT TERM GOAL #1   Title Patient will be independent with her HEP to decrease pain, improve strength and function.    Baseline Pt has started her HEP (03/19/2019); Pt demonstrates independence with her HEP (05/01/2019)    Time 3    Period Weeks    Status Achieved    Target Date 04/10/19             PT Long Term Goals - 11/05/19 1220      PT LONG TERM GOAL #1   Title Pt will have a decrease in R hip pain to 2/10 or less at worst to promote ability to run, perform exercises, stand up from a half kneeling position more comfortably.    Baseline 6/10 R hip pain at most (03/19/2019); 2-3/10 at most for the past 7 days (05/01/2019); 2-3/10 at most for the past 7 days (05/12/19), (05/26/2019); 3/10 (07/08/2019); 4/10 at most for the past 7 days (after workout at her gym and softball)  2-3/10 at most for the past 7 days. (11/04/2019)    Time 8    Period Weeks    Status On-going    Target Date 01/01/20      PT LONG TERM GOAL #2   Title Patient will improve R hip flexion, extension, abduction, and adduction strength by at least  1/2 MMT grade to promote ability to perform functional tasks more comfortably.    Baseline improved by 1/2 MMT grade but pain with hip adduction (05/26/2019); maintained except hip extension (07/08/2019); impmroved strength except hip extension (11/04/2019)    Time 8    Period Weeks    Status Partially Met  Target Date 01/01/20      PT LONG TERM GOAL #3   Title Pt will be able to jog for at least 5 minutes without R hip pain to promote ability to participate in exercise and promote physical health    Baseline increased R hip pain when jogging or running (03/19/2019); able to perform mini jog with short stride length (05/01/2019), (05/26/2019); Able to jog about 5 min except for this past Monday (07/08/2019); Pt states being able to jog, but does not know if it is painless (09/01/2019); able to jog at treadmil speed 4.5 normal stride length, no pain (11/04/2019)    Time 6    Period Weeks    Status Achieved    Target Date 10/16/19                 Plan - 12/10/19 1415    Clinical Impression Statement Worked on improving trunk posture, and core strength. No immediate change in supine SLR hip flexion strength observed. Continued working on hip flexor strengthening as well to improve ability to raise her R thigh up with less difficulty was well as to provide stress to help continue healing process. Pt tolerated session well without aggravation of symptoms. Pt will benefit from continued skilled physical therapy services to decrease pain, improve strength and function.    Personal Factors and Comorbidities Comorbidity 1    Comorbidities hx of C-section    Examination-Activity Limitations Squat    Stability/Clinical Decision Making Stable/Uncomplicated    Rehab Potential Good    PT Frequency 1x / week    PT Duration 8 weeks    PT Treatment/Interventions Therapeutic activities;Therapeutic exercise;Neuromuscular re-education;Patient/family education;Manual techniques;Dry needling;Spinal  Manipulations;Joint Manipulations;Aquatic Therapy;Electrical Stimulation;Iontophoresis 42m/ml Dexamethasone;Ultrasound    PT Next Visit Plan isometric, then concentric, then eccentric hip and abdominal strengthening, manual techniques, modalities PRN    PT Home Exercise Plan Medbridge Access Code: [email or web address]    Consulted and Agree with Plan of Care Patient           Patient will benefit from skilled therapeutic intervention in order to improve the following deficits and impairments:  Pain, Improper body mechanics, Postural dysfunction, Decreased strength  Visit Diagnosis: Pain in right hip  Muscle weakness (generalized)     Problem List Patient Active Problem List   Diagnosis Date Noted  . S/P cesarean section - triplets 11/29 02/17/2012  . Postpartum care following cesarean delivery (11/29) 02/17/2012  . Rh negative, maternal 02/17/2012  . 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 Routine general medical examination at a health care facility 06/23/2010  . FIBROCYSTIC BREAST DISEASE 07/17/2008  . Asthma, 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    MJoneen BoersPT, DPT   12/10/2019, 3:10 PM  [facility] [address] [place] [address]Phone: [phone]  Fax:  [phone] Name: NANALLELY ROSELLMRN: [id number] of Birth: 902/11/1980

## 2019-12-15 DIAGNOSIS — M25551 Pain in right hip: Secondary | ICD-10-CM | POA: Diagnosis not present

## 2019-12-17 ENCOUNTER — Ambulatory Visit: Payer: BC Managed Care – PPO

## 2019-12-17 ENCOUNTER — Other Ambulatory Visit: Payer: Self-pay

## 2019-12-17 DIAGNOSIS — M6281 Muscle weakness (generalized): Secondary | ICD-10-CM | POA: Diagnosis not present

## 2019-12-17 DIAGNOSIS — M25551 Pain in right hip: Secondary | ICD-10-CM

## 2019-12-17 NOTE — Patient Instructions (Signed)
Access Code: [email or web address] URL: [email or web address] Date: 12/17/2019 Prepared by: Loralyn Freshwater  Exercises Supine Bridge with [employer] Between Knees - 1 x daily - 7 x weekly - 10 reps - 3 sets - 5 seconds hold Bent Knee Fallouts - 1 x daily - 7 x weekly - 10 reps - 3 sets Single Leg Stance with Support - 3 x daily - 7 x weekly - 10 reps - 3 sets - 5 seconds hold Shoulder extension with resistance - Neutral - 1 x daily - 7 x weekly - 10 reps - 3 sets - 10 hold Straight Leg Raise - 1 x daily - 7 x weekly - 2 sets - 10 reps Prone Quadriceps Stretch with Strap - 3 x daily - 7 x weekly - 1 sets - 3 reps - 30 seconds hold Seated Hip Adduction Isometrics with Ball - 1 x daily - 7 x weekly - 3 sets - 10 reps - 10 seconds hold Bird Dog - 1 x daily - 7 x weekly - 2 sets - 10 reps Supine Transversus Abdominis Bracing with Heel Slide - 1 x daily - 7 x weekly - 2 sets - 10 reps Supine March with Posterior Pelvic Tilt - 1 x daily - 7 x weekly - 3 sets - 20 reps - 5 seconds hold Standing Hip Flexion with Ankle Weight - 1 x daily - 7 x weekly - 2 sets - 10 reps

## 2019-12-17 NOTE — Therapy (Signed)
[facility] [address], [place], [address] Phone: [phone]   Fax:  [phone]  Physical Therapy Treatment  Patient Details  Name: Nicole Owens MRN: [id number] Date of Birth: [birth year] Referring Provider (PT): Lynne Leader, MD   Encounter Date: 12/17/2019   PT End of Session - 12/17/19 1346    Visit Number 42    Number of Visits 45    Date for PT Re-Evaluation 01/01/20    PT Start Time 1346    PT Stop Time 1428    PT Time Calculation (min) 42 min    Activity Tolerance Patient tolerated treatment well;No increased pain    Behavior During Therapy WFL for tasks assessed/performed           Past Medical History:  Diagnosis Date  . Asthma   . Asthma, intermittent    mild  . Carpal tunnel syndrome on both sides    with pregnancy  . Female infertility 04/30/2010   history restored after error with record merge  . Heartburn in pregnancy    nausea  . 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 Postpartum care following cesarean delivery 02/17/2012  . Rh negative, maternal 02/17/2012  . S/P cesarean section - triplets 11/29 02/17/2012    Past Surgical History:  Procedure Laterality Date  . CESAREAN SECTION  02/16/2012   Procedure: CESAREAN SECTION;  Surgeon: Lovenia Kim, MD;  Location: [facility];  Service: Obstetrics;  Laterality: N/A;  . NO PAST SURGERIES    . WISDOM TOOTH EXTRACTION      There were no vitals filed for this visit.   Subjective Assessment - 12/17/19 1347    Subjective R hip is ok. Hurting more than normal since Monday. Does not know what she did. Not preventing her from doing anything. Did not do anything different. 2/10 R hip pain currently.  Has an MRI scheduled for R hip this Wednesday.    Pertinent History R hip pain. Pain began mid November 2020. Pt was at the gym. Pt was performing scissor kicks. Felt a little pain more than normal. Went for a backward lunge with R LE  and felt sharp pain R lower abdomen. Took 10 days off and iced that area. Did not really get better. Was diagnosed with sports hernia. Still getting pain but getting a little better. Could not jog before 6 weeks ago. Thinks she can jog slowly now. Using her R LE to stand up from a R half kneeling position bothers her. Pt goes to the gym 6 days a week before, no goes to the gym 1 time a week and performs modified upper body work out. Pivoting the wrong way sometimes would hurt. Going up the steps 2 at a time bothers her R hip. Getting out of bed involving SLR hip flexion movement would bother it. Coughing, sneezing would bother R hip. Had C section 7 years ago for triplets. The pain is along the line of the incision.    Currently in Pain? Yes    Pain Score 2                                      PT Education - 12/17/19 1421    Education Details ther-ex, HEP    Person(s) Educated Patient    Methods Explanation;Demonstration;Tactile cues;Verbal cues    Comprehension Returned demonstration;Verbalized understanding  Objectives  No latex allergies  MedbridgeAccess Code: [email or web address]  Pt wants to work up to being able to do planks, pushups, sit ups, spider and mountain climbers, jumps  standing posture: slight lateral shift, slight R lower trunk rotation    Therapeutic exercise  Standing R hip flexion then hip extension with L UE assist 10x3  Standing hip abduction, then adduction with B UE assist 10x3 pain free range  R Single leg dead lift with L UE assist   10x3  Standing R SLR hip flexion   10x3  hooklying alternating leg extension   R 3x (difficult)  L 3x (easy)  Seated R knee extension. Easy  Supine SLR eccentric hip flexion (with bolster initially from SAQ position)  R 10x2  Bridge with march 5x2 each LE  Difficult for R LE  Walking lunges 30 ft x2 forward    Improved exercise technique, movement at target joints, use of  target muscles aftermin tomod verbal, visual, tactile cues.    Response to treatment Fair tolerance to today's session    Clinical presentation Still demonstrates difficulty with R hip flexion. Continued with strengthening as tolerated to decrease weakness.  Pt will benefit from continued skilled physical therapy services to decrease pain, improve strength and function.       PT Short Term Goals - 05/01/19 1149      PT SHORT TERM GOAL #1   Title Patient will be independent with her HEP to decrease pain, improve strength and function.    Baseline Pt has started her HEP (03/19/2019); Pt demonstrates independence with her HEP (05/01/2019)    Time 3    Period Weeks    Status Achieved    Target Date 04/10/19             PT Long Term Goals - 11/05/19 1220      PT LONG TERM GOAL #1   Title Pt will have a decrease in R hip pain to 2/10 or less at worst to promote ability to run, perform exercises, stand up from a half kneeling position more comfortably.    Baseline 6/10 R hip pain at most (03/19/2019); 2-3/10 at most for the past 7 days (05/01/2019); 2-3/10 at most for the past 7 days (05/12/19), (05/26/2019); 3/10 (07/08/2019); 4/10 at most for the past 7 days (after workout at her gym and softball)  2-3/10 at most for the past 7 days. (11/04/2019)    Time 8    Period Weeks    Status On-going    Target Date 01/01/20      PT LONG TERM GOAL #2   Title Patient will improve R hip flexion, extension, abduction, and adduction strength by at least 1/2 MMT grade to promote ability to perform functional tasks more comfortably.    Baseline improved by 1/2 MMT grade but pain with hip adduction (05/26/2019); maintained except hip extension (07/08/2019); impmroved strength except hip extension (11/04/2019)    Time 8    Period Weeks    Status Partially Met    Target Date 01/01/20      PT LONG TERM GOAL #3   Title Pt will be able to jog for at least 5 minutes without R hip pain to promote  ability to participate in exercise and promote physical health    Baseline increased R hip pain when jogging or running (03/19/2019); able to perform mini jog with short stride length (05/01/2019), (05/26/2019); Able to jog about 5 min except for this past Monday (07/08/2019); Pt  states being able to jog, but does not know if it is painless (09/01/2019); able to jog at treadmil speed 4.5 normal stride length, no pain (11/04/2019)    Time 6    Period Weeks    Status Achieved    Target Date 10/16/19                 Plan - 12/17/19 1423    Clinical Impression Statement Still demonstrates difficulty with R hip flexion. Continued with strengthening as tolerated to decrease weakness.  Pt will benefit from continued skilled physical therapy services to decrease pain, improve strength and function.    Personal Factors and Comorbidities Comorbidity 1    Comorbidities hx of C-section    Examination-Activity Limitations Squat    Stability/Clinical Decision Making Stable/Uncomplicated    Rehab Potential Good    PT Frequency 1x / week    PT Duration 8 weeks    PT Treatment/Interventions Therapeutic activities;Therapeutic exercise;Neuromuscular re-education;Patient/family education;Manual techniques;Dry needling;Spinal Manipulations;Joint Manipulations;Aquatic Therapy;Electrical Stimulation;Iontophoresis 16m/ml Dexamethasone;Ultrasound    PT Next Visit Plan isometric, then concentric, then eccentric hip and abdominal strengthening, manual techniques, modalities PRN    PT Home Exercise Plan Medbridge Access Code: [email or web address]    Consulted and Agree with Plan of Care Patient           Patient will benefit from skilled therapeutic intervention in order to improve the following deficits and impairments:  Pain, Improper body mechanics, Postural dysfunction, Decreased strength  Visit Diagnosis: Pain in right hip  Muscle weakness (generalized)     Problem List Patient Active Problem List   Diagnosis  Date Noted  . S/P cesarean section - triplets 11/29 02/17/2012  . Postpartum care following cesarean delivery (11/29) 02/17/2012  . Rh negative, maternal 02/17/2012  . 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 Routine general medical examination at a health care facility 06/23/2010  . FIBROCYSTIC BREAST DISEASE 07/17/2008  . Asthma, 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    MJoneen BoersPT, DPT   12/17/2019, 3:42 PM  [facility] [address] [place] [address]Phone: [phone]  Fax:  [phone] Name: Nicole SOUTHERNMRN: [id number] of Birth: [birth year]

## 2019-12-22 ENCOUNTER — Other Ambulatory Visit: Payer: Self-pay

## 2019-12-22 ENCOUNTER — Ambulatory Visit: Payer: BC Managed Care – PPO | Attending: Family Medicine

## 2019-12-22 DIAGNOSIS — M6281 Muscle weakness (generalized): Secondary | ICD-10-CM | POA: Diagnosis not present

## 2019-12-22 DIAGNOSIS — M25551 Pain in right hip: Secondary | ICD-10-CM | POA: Diagnosis not present

## 2019-12-22 NOTE — Therapy (Signed)
[facility] [address], [place], [address] Phone: [phone]   Fax:  [phone]  Physical Therapy Treatment  Patient Details  Name: Nicole Owens MRN: [id number] Date of Birth: 10-Sep-1980  Referring Provider (PT): Lynne Leader, MD   Encounter Date: 12/22/2019   PT End of Session - 12/22/19 1035    Visit Number 43    Number of Visits 45    Date for PT Re-Evaluation 01/01/20    PT Start Time 8563    PT Stop Time 1115    PT Time Calculation (min) 40 min    Activity Tolerance Patient tolerated treatment well;No increased pain    Behavior During Therapy WFL for tasks assessed/performed           Past Medical History:  Diagnosis Date  . Asthma   . Asthma, intermittent    mild  . Carpal tunnel syndrome on both sides    with pregnancy  . Female infertility 04/30/2010   history restored after error with record merge  . Heartburn in pregnancy    nausea  . 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 Postpartum care following cesarean delivery 02/17/2012  . Rh negative, maternal 02/17/2012  . S/P cesarean section - triplets 11/29 02/17/2012    Past Surgical History:  Procedure Laterality Date  . CESAREAN SECTION  02/16/2012   Procedure: CESAREAN SECTION;  Surgeon: Lovenia Kim, MD;  Location: [facility];  Service: Obstetrics;  Laterality: N/A;  . NO PAST SURGERIES    . WISDOM TOOTH EXTRACTION      There were no vitals filed for this visit.   Subjective Assessment - 12/22/19 1036    Subjective R hip feels ok. Was hurting yesterday. Wore heels on Saturday. No pain currently. Was ok after last session, felt like a workout, tired. Was fine after a few hours.  Dr Percell Miller  (from [facility]) ordered her MRI for R hip.    Pertinent History R hip pain. Pain began mid November 2020. Pt was at the gym. Pt was performing scissor kicks. Felt a little pain more than normal. Went for a  backward lunge with R LE and felt sharp pain R lower abdomen. Took 10 days off and iced that area. Did not really get better. Was diagnosed with sports hernia. Still getting pain but getting a little better. Could not jog before 6 weeks ago. Thinks she can jog slowly now. Using her R LE to stand up from a R half kneeling position bothers her. Pt goes to the gym 6 days a week before, no goes to the gym 1 time a week and performs modified upper body work out. Pivoting the wrong way sometimes would hurt. Going up the steps 2 at a time bothers her R hip. Getting out of bed involving SLR hip flexion movement would bother it. Coughing, sneezing would bother R hip. Had C section 7 years ago for triplets. The pain is along the line of the incision.    Currently in Pain? No/denies    Pain Score 0-No pain                                     PT Education - 12/22/19 1039    Education Details ther-ex    Person(s) Educated Patient    Methods Explanation;Demonstration;Tactile cues;Verbal cues    Comprehension Returned demonstration;Verbalized  understanding          Objectives  No latex allergies  MedbridgeAccess Code: [email or web address]  Pt wants to work up to being able to do planks, pushups, sit ups, spider and mountain climbers, jumps  standing posture: slight lateral shift, slight R lower trunk rotation    Therapeutic exercise  Supine posterior pelvic tilt 10x10 seconds   Mini curl up 10x5 seconds for 3 sets  Based on JOSPT article on decreased diastatis recti width with curl up position   Decreased diastatis recti width palpated at times, others, no change.   hooklying isometric lower trunk rotation R and L with PT manual resistancein neutral5 seconds x 10 each way for 3 sets   Supine SLR eccentric hip flexion (with bolster initially from SAQ position)             R 5x2   Supine SLR hip flexion with pillow under pelvis with posterior pelvic tilt 10x, then  6x. R hip discomfort   hooklying hip adduction ball and glute squeeze with pillow under pelvis 10x10 seconds for 2 sets   Bridge with ball squeeze 10x10 seconds    Standing R SLR hip flexion              10x2  SLS   R 10x10 seconds      Improved exercise technique, movement at target joints, use of target muscles aftermin tomod verbal, visual, tactile cues.    Response to treatment Fair tolerance to today's session    Clinical presentation Added mini curl up exercise today.  Decreased diastatis recti width palpated at times, others, no change. Continued working on transversus and oblique strength as well as hip strengthening to promote ability to perform hip flexion with less difficulty. Still demonstrates difficulty with straight leg raise hip flexion. Discomfort at times at her hip during exercises. Fair tolerance to today's session. Pt will benefit from continued skilled physical therapy services to decrease pain, improve strength and function.          PT Short Term Goals - 05/01/19 1149      PT SHORT TERM GOAL #1   Title Patient will be independent with her HEP to decrease pain, improve strength and function.    Baseline Pt has started her HEP (03/19/2019); Pt demonstrates independence with her HEP (05/01/2019)    Time 3    Period Weeks    Status Achieved    Target Date 04/10/19             PT Long Term Goals - 11/05/19 1220      PT LONG TERM GOAL #1   Title Pt will have a decrease in R hip pain to 2/10 or less at worst to promote ability to run, perform exercises, stand up from a half kneeling position more comfortably.    Baseline 6/10 R hip pain at most (03/19/2019); 2-3/10 at most for the past 7 days (05/01/2019); 2-3/10 at most for the past 7 days (05/12/19), (05/26/2019); 3/10 (07/08/2019); 4/10 at most for the past 7 days (after workout at her gym and softball)  2-3/10 at most for the past 7 days. (11/04/2019)    Time 8    Period Weeks    Status  On-going    Target Date 01/01/20      PT LONG TERM GOAL #2   Title Patient will improve R hip flexion, extension, abduction, and adduction strength by at least 1/2 MMT grade to promote ability to perform functional tasks more  comfortably.    Baseline improved by 1/2 MMT grade but pain with hip adduction (05/26/2019); maintained except hip extension (07/08/2019); impmroved strength except hip extension (11/04/2019)    Time 8    Period Weeks    Status Partially Met    Target Date 01/01/20      PT LONG TERM GOAL #3   Title Pt will be able to jog for at least 5 minutes without R hip pain to promote ability to participate in exercise and promote physical health    Baseline increased R hip pain when jogging or running (03/19/2019); able to perform mini jog with short stride length (05/01/2019), (05/26/2019); Able to jog about 5 min except for this past Monday (07/08/2019); Pt states being able to jog, but does not know if it is painless (09/01/2019); able to jog at treadmil speed 4.5 normal stride length, no pain (11/04/2019)    Time 6    Period Weeks    Status Achieved    Target Date 10/16/19                 Plan - 12/22/19 1039    Clinical Impression Statement Added mini curl up exercise today.  Decreased diastatis recti width palpated at times, others, no change. Continued working on transversus and oblique strength as well as hip strengthening to promote ability to perform hip flexion with less difficulty. Still demonstrates difficulty with straight leg raise hip flexion. Discomfort at times at her hip during exercises. Fair tolerance to today's session. Pt will benefit from continued skilled physical therapy services to decrease pain, improve strength and function.    Personal Factors and Comorbidities Comorbidity 1    Comorbidities hx of C-section    Examination-Activity Limitations Squat    Stability/Clinical Decision Making Stable/Uncomplicated    Rehab Potential Good    PT Frequency 1x /  week    PT Duration 8 weeks    PT Treatment/Interventions Therapeutic activities;Therapeutic exercise;Neuromuscular re-education;Patient/family education;Manual techniques;Dry needling;Spinal Manipulations;Joint Manipulations;Aquatic Therapy;Electrical Stimulation;Iontophoresis 65m/ml Dexamethasone;Ultrasound    PT Next Visit Plan isometric, then concentric, then eccentric hip and abdominal strengthening, manual techniques, modalities PRN    PT Home Exercise Plan Medbridge Access Code: [email or web address]    Consulted and Agree with Plan of Care Patient           Patient will benefit from skilled therapeutic intervention in order to improve the following deficits and impairments:  Pain, Improper body mechanics, Postural dysfunction, Decreased strength  Visit Diagnosis: Pain in right hip  Muscle weakness (generalized)     Problem List Patient Active Problem List   Diagnosis Date Noted  . S/P cesarean section - triplets 11/29 02/17/2012  . Postpartum care following cesarean delivery (11/29) 02/17/2012  . Rh negative, maternal 02/17/2012  . 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 Routine general medical examination at a health care facility 06/23/2010  . FIBROCYSTIC BREAST DISEASE 07/17/2008  . Asthma, 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    MJoneen BoersPT, DPT   12/22/2019, 11:34 AM  [facility] [address] [place] [address]Phone: [phone]  Fax:  [phone] Name: Nicole CHISHOLMMRN: [id number] of Birth: [birth year]

## 2019-12-24 DIAGNOSIS — M25559 Pain in unspecified hip: Secondary | ICD-10-CM | POA: Diagnosis not present

## 2019-12-24 DIAGNOSIS — M25551 Pain in right hip: Secondary | ICD-10-CM | POA: Diagnosis not present

## 2019-12-29 DIAGNOSIS — M25551 Pain in right hip: Secondary | ICD-10-CM | POA: Diagnosis not present

## 2019-12-31 ENCOUNTER — Ambulatory Visit: Payer: BC Managed Care – PPO

## 2019-12-31 ENCOUNTER — Other Ambulatory Visit: Payer: Self-pay

## 2019-12-31 DIAGNOSIS — M6281 Muscle weakness (generalized): Secondary | ICD-10-CM | POA: Diagnosis not present

## 2019-12-31 DIAGNOSIS — M25551 Pain in right hip: Secondary | ICD-10-CM | POA: Diagnosis not present

## 2019-12-31 NOTE — Therapy (Signed)
[facility] [address] [place], [address] Phone: [phone]   Fax:  [phone]  Physical Therapy Treatment  Patient Details  Name: Nicole Owens MRN: [id number] Date of Birth: [birth year] Referring Provider (PT): [name], MD   Encounter Date: 12/31/2019   PT End of Session - 12/31/19 0805    Visit Number 44    Number of Visits 51    Date for PT Re-Evaluation 02/12/20    PT Start Time 0805    PT Stop Time 0858    PT Time Calculation (min) 53 min    Activity Tolerance Patient tolerated treatment well    Behavior During Therapy [facility] for tasks assessed/performed            Past Medical History:  Diagnosis Date  . Asthma   . Asthma, intermittent    mild  . Carpal tunnel syndrome on both sides    with pregnancy  . Female infertility 04/30/2010   history restored after error with record merge  . Heartburn in pregnancy    nausea  . 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 Postpartum care following cesarean delivery 02/17/2012  . Rh negative, maternal 02/17/2012  . S/P cesarean section - triplets 11/29 02/17/2012    Past Surgical History:  Procedure Laterality Date  . CESAREAN SECTION  02/16/2012   Procedure: CESAREAN SECTION;  Surgeon: Lenoard Aden, MD;  Location: [facility];  Service: Obstetrics;  Laterality: N/A;  . NO PAST SURGERIES    . WISDOM TOOTH EXTRACTION      There were no vitals filed for this visit.   Subjective Assessment - 12/31/19 0810    Subjective Had a cortisone shot R hip this past Monday which helped her do lunges at the gym this morning. Returning to ortho in 4 weeks. Has an appointment with a surgeon this Thursday.  Has an adductor strain (1.4 cm tear at adductor aponeurosis)    Pertinent History R hip pain. Pain began mid November 2020. Pt was at the gym. Pt was performing scissor kicks. Felt a little pain more than normal. Went for a backward lunge with R LE and  felt sharp pain R lower abdomen. Took 10 days off and iced that area. Did not really get better. Was diagnosed with sports hernia. Still getting pain but getting a little better. Could not jog before 6 weeks ago. Thinks she can jog slowly now. Using her R LE to stand up from a R half kneeling position bothers her. Pt goes to the gym 6 days a week before, no goes to the gym 1 time a week and performs modified upper body work out. Pivoting the wrong way sometimes would hurt. Going up the steps 2 at a time bothers her R hip. Getting out of bed involving SLR hip flexion movement would bother it. Coughing, sneezing would bother R hip. Had C section 7 years ago for triplets. The pain is along the line of the incision.    Currently in Pain? No/denies    Pain Score 0-No pain              [facility] PT Assessment - 12/31/19 0849      Strength   Right Hip Flexion 5/5   seated hip flexion (not SLR hip flexion); no pain   Right Hip Extension 4/5    Right Hip ABduction 5/5   no pain   Right Hip ADduction 4+/5   adductor muscle  pain                                PT Education - 12/31/19 0844    Education Details ther-ex    Person(s) Educated Patient    Methods Explanation;Demonstration;Tactile cues;Verbal cues    Comprehension Verbalized understanding;Returned demonstration           Objectives  No latex allergies  01/26/2020 next appointment with Dr. Eulah Pont   MedbridgeAccess Code: [email or web address]  Pt wants to work up to being able to do planks, pushups, sit ups, spider and mountain climbers, jumps    Therapeutic exercise  Sitting with upright posture with transversus abdomins contraction   Manual PT perturbation 1 minute. R hip tingling    L side bend isometrics 1 minute    L upper trunk rotation isometrics in neutral to promote R lower trunk rotation for more neutral posture 10x5 seconds for 3 sets   No R hip tingling afterwards    Sitting pallof press  double red band   Resistance on R side 10x5 seconds for 3 sets  To decrease L low back rotation posture.   No R hip tingling at rest  Standing R hip adduction yellow band, pain free range of effort 10x3  SLS R LE 1 minute x 3   R LE SLS with ball toss 3 kg 20 throws x 3 to promote R hip stability strength  Seated manually resisted R hip flexion, S/L hip abduction, adduction, prone hip extension 1-2x each way  Reviewed progress with hip strength with pt.    Reviewed plan of care: continue 1x/week for 6 weeks to help continue progress   Improved exercise technique, movement at target joints, use of target muscles aftermin tomod verbal, visual, tactile cues.    Response to treatment Fair tolerance to today's session.    Clinical presentation Pt demonstrates overall decreased R hip pain on average, improved R hip flexor, adductor, abductor, and extension strength, and ability to return to gym activities since initial evaluation. Continued working on core and adductor strength today to decrease weakness. Pt still demonstrates R hip weakness with straight leg raise hip flexion, R hip adduction with flexion, as well as core weakness, and R hip pain. Pt will benefit from continued skilled physical therapy services to address the aforementioned deficits.        PT Short Term Goals - 05/01/19 1149      PT SHORT TERM GOAL #1   Title Patient will be independent with her HEP to decrease pain, improve strength and function.    Baseline Pt has started her HEP (03/19/2019); Pt demonstrates independence with her HEP (05/01/2019)    Time 3    Period Weeks    Status Achieved    Target Date 04/10/19             PT Long Term Goals - 12/31/19 0847      PT LONG TERM GOAL #1   Title Pt will have a decrease in R hip pain to 2/10 or less at worst to promote ability to run, perform exercises, stand up from a half kneeling position more comfortably.    Baseline 6/10 R hip pain at  most (03/19/2019); 2-3/10 at most for the past 7 days (05/01/2019); 2-3/10 at most for the past 7 days (05/12/19), (05/26/2019); 3/10 (07/08/2019); 4/10 at most for the past 7 days (after workout at her gym and softball)  2-3/10  at most for the past 7 days. (11/04/2019); 4/10  R hip pain at most for the past 7 days (12/31/2019)    Time 6    Period Weeks    Status On-going    Target Date 02/12/20      PT LONG TERM GOAL #2   Title Patient will improve R hip flexion, extension, abduction, and adduction strength by at least 1/2 MMT grade to promote ability to perform functional tasks more comfortably.    Baseline improved by 1/2 MMT grade but pain with hip adduction (05/26/2019); maintained except hip extension (07/08/2019); impmroved strength except hip extension (11/04/2019); improved strength at all target planes (12/31/2019)    Time 8    Period Weeks    Status Achieved    Target Date 01/01/20      PT LONG TERM GOAL #3   Title Pt will be able to jog for at least 5 minutes without R hip pain to promote ability to participate in exercise and promote physical health    Baseline increased R hip pain when jogging or running (03/19/2019); able to perform mini jog with short stride length (05/01/2019), (05/26/2019); Able to jog about 5 min except for this past Monday (07/08/2019); Pt states being able to jog, but does not know if it is painless (09/01/2019); able to jog at treadmil speed 4.5 normal stride length, no pain (11/04/2019)    Time 6    Period Weeks    Status Achieved    Target Date 01/01/20                 Plan - 12/31/19 0903    Clinical Impression Statement Pt demonstrates overall decreased R hip pain on average, improved R hip flexor, adductor, abductor, and extension strength, and ability to return to gym activities since initial evaluation. Continued working on core and adductor strength today to decrease weakness. Pt still demonstrates R hip weakness with straight leg raise hip flexion, R  hip adduction with flexion, as well as core weakness, and R hip pain. Pt will benefit from continued skilled physical therapy services to address the aforementioned deficits.    Personal Factors and Comorbidities Comorbidity 1    Comorbidities hx of C-section    Examination-Activity Limitations Squat    Stability/Clinical Decision Making Stable/Uncomplicated    Clinical Decision Making Low    Rehab Potential Good    PT Frequency 1x / week    PT Duration 6 weeks    PT Treatment/Interventions Therapeutic activities;Therapeutic exercise;Neuromuscular re-education;Patient/family education;Manual techniques;Dry needling;Spinal Manipulations;Joint Manipulations;Aquatic Therapy;Electrical Stimulation;Iontophoresis 4mg /ml Dexamethasone;Ultrasound    PT Next Visit Plan isometric, then concentric, then eccentric hip and abdominal strengthening, manual techniques, modalities PRN    PT Home Exercise Plan Medbridge Access Code: [email or web address]    Consulted and Agree with Plan of Care Patient           Patient will benefit from skilled therapeutic intervention in order to improve the following deficits and impairments:  Pain, Improper body mechanics, Postural dysfunction, Decreased strength  Visit Diagnosis: Pain in right hip - Plan: PT plan of care cert/re-cert  Muscle weakness (generalized) - Plan: PT plan of care cert/re-cert     Problem List Patient Active Problem List   Diagnosis Date Noted  . S/P cesarean section - triplets 11/29 02/17/2012  . Postpartum care following cesarean delivery (11/29) 02/17/2012  . Rh negative, maternal 02/17/2012  . 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 Routine general medical examination at a health care facility 06/23/2010  .  FIBROCYSTIC BREAST DISEASE 07/17/2008  . Asthma, 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     Loralyn FreshwaterMiguel Tarance Balan PT, DPT  12/31/2019, 9:17 AM  [facility] [address] [place], [place],  [id number] Phone: [phone]   Fax:  [phone]  Name: Nicole Pokeicole D Owens MRN: [id number] Date of Birth: 02/15/1981

## 2020-01-05 ENCOUNTER — Other Ambulatory Visit: Payer: Self-pay

## 2020-01-05 ENCOUNTER — Ambulatory Visit: Payer: BC Managed Care – PPO

## 2020-01-05 DIAGNOSIS — M6281 Muscle weakness (generalized): Secondary | ICD-10-CM

## 2020-01-05 DIAGNOSIS — M25551 Pain in right hip: Secondary | ICD-10-CM

## 2020-01-05 NOTE — Therapy (Signed)
[place] [facility] [address] [place], [address] Phone: [phone]   Fax:  [phone]  Physical Therapy Treatment  Patient Details  Name: Nicole Owens MRN: [id number] Date of Birth: [birth year] Referring Provider (PT): [name], MD   Encounter Date: 01/05/2020   PT End of Session - 01/05/20 0809    Visit Number 45    Number of Visits 51    Date for PT Re-Evaluation 02/12/20    PT Start Time 0809    PT Stop Time 0849    PT Time Calculation (min) 40 min    Activity Tolerance Patient tolerated treatment well    Behavior During Therapy [facility] for tasks assessed/performed           Past Medical History:  Diagnosis Date  . Asthma   . Asthma, intermittent    mild  . Carpal tunnel syndrome on both sides    with pregnancy  . Female infertility 04/30/2010   history restored after error with record merge  . Heartburn in pregnancy    nausea  . 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 Postpartum care following cesarean delivery 02/17/2012  . Rh negative, maternal 02/17/2012  . S/P cesarean section - triplets 11/29 02/17/2012    Past Surgical History:  Procedure Laterality Date  . CESAREAN SECTION  02/16/2012   Procedure: CESAREAN SECTION;  Surgeon: Lenoard Aden, MD;  Location: [facility];  Service: Obstetrics;  Laterality: N/A;  . NO PAST SURGERIES    . WISDOM TOOTH EXTRACTION      There were no vitals filed for this visit.   Subjective Assessment - 01/05/20 0810    Subjective Has a busy morning with kids apppintments. R hip feels good, no pain currently.    Pertinent History R hip pain. Pain began mid November 2020. Pt was at the gym. Pt was performing scissor kicks. Felt a little pain more than normal. Went for a backward lunge with R LE and felt sharp pain R lower abdomen. Took 10 days off and iced that area. Did not really get better. Was diagnosed with sports hernia. Still getting pain but  getting a little better. Could not jog before 6 weeks ago. Thinks she can jog slowly now. Using her R LE to stand up from a R half kneeling position bothers her. Pt goes to the gym 6 days a week before, no goes to the gym 1 time a week and performs modified upper body work out. Pivoting the wrong way sometimes would hurt. Going up the steps 2 at a time bothers her R hip. Getting out of bed involving SLR hip flexion movement would bother it. Coughing, sneezing would bother R hip. Had C section 7 years ago for triplets. The pain is along the line of the incision.    Currently in Pain? No/denies    Pain Score 0-No pain                                     PT Education - 01/05/20 0824    Education Details ther-ex    Person(s) Educated Patient    Methods Explanation;Demonstration;Tactile cues;Verbal cues    Comprehension Returned demonstration;Verbalized understanding          Objectives  No latex allergies  01/26/2020 next appointment with Dr. Eulah Pont   MedbridgeAccess Code: [email or web address]  Pt wants to work up to  being able to do planks, pushups, sit ups, spider and mountain climbers, jumps    Therapeutic exercise  Sitting with upright posture with transversus abdomins contraction              Manual PT perturbation 1 minute x 3              L side bend isometrics 1 minute x 3               L upper trunk rotation isometrics in neutral to promote R lower trunk rotation for more neutral posture 10x5 seconds for 3 sets    R LE SLS 1 min with eyes closed   R LE SLS with ball toss 3 kg 20 throws x 3 to promote R hip stability strength  Standing R hip adduction yellow band, pain free range of effort 10x3  Standing R hip extension yellow band 10x   Sitting pallof press double red band              Resistance on R side 10x5 seconds for 3 sets             To decrease L low back rotation posture.           Walking lunges 32 ft forward and 32 ft  backward, comfortable ROM     Improved exercise technique, movement at target joints, use of target muscles aftermin tomod verbal, visual, tactile cues.    Response to treatment Pt tolerated session well without aggravation of symptoms.    Clinical presentation Continued working on improving core and R hip stability and adductor strength. Pt tolerated session well without aggravation of symptoms. Still demonstrates R hip flexion with adduction weakness. Pt will benefit from continued skilled physical therapy services to decrease pain, improve strength and function.     PT Short Term Goals - 05/01/19 1149      PT SHORT TERM GOAL #1   Title Patient will be independent with her HEP to decrease pain, improve strength and function.    Baseline Pt has started her HEP (03/19/2019); Pt demonstrates independence with her HEP (05/01/2019)    Time 3    Period Weeks    Status Achieved    Target Date 04/10/19             PT Long Term Goals - 12/31/19 0847      PT LONG TERM GOAL #1   Title Pt will have a decrease in R hip pain to 2/10 or less at worst to promote ability to run, perform exercises, stand up from a half kneeling position more comfortably.    Baseline 6/10 R hip pain at most (03/19/2019); 2-3/10 at most for the past 7 days (05/01/2019); 2-3/10 at most for the past 7 days (05/12/19), (05/26/2019); 3/10 (07/08/2019); 4/10 at most for the past 7 days (after workout at her gym and softball)  2-3/10 at most for the past 7 days. (11/04/2019); 4/10  R hip pain at most for the past 7 days (12/31/2019)    Time 6    Period Weeks    Status On-going    Target Date 02/12/20      PT LONG TERM GOAL #2   Title Patient will improve R hip flexion, extension, abduction, and adduction strength by at least 1/2 MMT grade to promote ability to perform functional tasks more comfortably.    Baseline improved by 1/2 MMT grade but pain with hip adduction (05/26/2019); maintained except hip  extension (  07/08/2019); impmroved strength except hip extension (11/04/2019); improved strength at all target planes (12/31/2019)    Time 8    Period Weeks    Status Achieved    Target Date 01/01/20      PT LONG TERM GOAL #3   Title Pt will be able to jog for at least 5 minutes without R hip pain to promote ability to participate in exercise and promote physical health    Baseline increased R hip pain when jogging or running (03/19/2019); able to perform mini jog with short stride length (05/01/2019), (05/26/2019); Able to jog about 5 min except for this past Monday (07/08/2019); Pt states being able to jog, but does not know if it is painless (09/01/2019); able to jog at treadmil speed 4.5 normal stride length, no pain (11/04/2019)    Time 6    Period Weeks    Status Achieved    Target Date 01/01/20                 Plan - 01/05/20 0810    Clinical Impression Statement Continued working on improving core and R hip stability and adductor strength. Pt tolerated session well without aggravation of symptoms. Still demonstrates R hip flexion with adduction weakness. Pt will benefit from continued skilled physical therapy services to decrease pain, improve strength and function.    Personal Factors and Comorbidities Comorbidity 1    Comorbidities hx of C-section    Examination-Activity Limitations Squat    Stability/Clinical Decision Making Stable/Uncomplicated    Rehab Potential Good    PT Frequency 1x / week    PT Duration 6 weeks    PT Treatment/Interventions Therapeutic activities;Therapeutic exercise;Neuromuscular re-education;Patient/family education;Manual techniques;Dry needling;Spinal Manipulations;Joint Manipulations;Aquatic Therapy;Electrical Stimulation;Iontophoresis 4mg /ml Dexamethasone;Ultrasound    PT Next Visit Plan isometric, then concentric, then eccentric hip and abdominal strengthening, manual techniques, modalities PRN    PT Home Exercise Plan Medbridge Access Code: [email or web address]     Consulted and Agree with Plan of Care Patient           Patient will benefit from skilled therapeutic intervention in order to improve the following deficits and impairments:  Pain, Improper body mechanics, Postural dysfunction, Decreased strength  Visit Diagnosis: Pain in right hip  Muscle weakness (generalized)     Problem List Patient Active Problem List   Diagnosis Date Noted  . S/P cesarean section - triplets 11/29 02/17/2012  . Postpartum care following cesarean delivery (11/29) 02/17/2012  . Rh negative, maternal 02/17/2012  . 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 Routine general medical examination at a health care facility 06/23/2010  . FIBROCYSTIC BREAST DISEASE 07/17/2008  . Asthma, 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    06/19/2008 PT, DPT   01/05/2020, 9:01 AM  [place] [facility]IC[facility] [address], [address], [place] Phone: [phone]   Fax:  [phone]  Name: Nicole Owens MRN: Darrol Poke Date of Birth: 01-03-1981

## 2020-01-08 ENCOUNTER — Other Ambulatory Visit: Payer: Self-pay | Admitting: General Surgery

## 2020-01-08 ENCOUNTER — Ambulatory Visit: Payer: Self-pay | Admitting: General Surgery

## 2020-01-08 ENCOUNTER — Other Ambulatory Visit (HOSPITAL_COMMUNITY): Payer: Self-pay | Admitting: General Surgery

## 2020-01-08 DIAGNOSIS — S3981XD Other specified injuries of abdomen, subsequent encounter: Secondary | ICD-10-CM | POA: Diagnosis not present

## 2020-01-08 NOTE — H&P (Signed)
History of Present Illness Axel Filler MD; 01/08/2020 11:07 AM) The patient is a 39 year old female who presents with an inguinal hernia. Patient is a 39 year old female, comes back in secondary to continued right medial inguinal pain. Patient states that she has had continued this in flexion of her right hip. As well as external rotation. Patient has been undergoing PT. Patient did have an MRI per orthopedics linear tear at her adductor muscle. I did review this personally. Patient did have a recent cortisone shots. Has helped with the pain.  Patient is unsure whether not she has a ventral hernia. She did have triplets. 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   ------------------------------------------- Chief Complaint: Patient is a 39 year old female who is referred secondary to right lateral inguinal pain. Patient states this occurred several months ago after doing a lunch. She states she heard a pop was felt. She states that she had significant pain thereafter. She states that since that time she's been undergoing PT. Some resolution of the pain. She states initially she had no ability to lift her leg off the table. Patient has been doing some high-intensity workout with the burn boot camp, as well as with physical therapy. She states that she has plateaued and her progress. She's been doing PT for approximately 9 months.  Patient states that she has regained some function and strength to her right lower extremity. It does appear that she's had most difficulty with flexion of her right thigh.   Allergies [facility], RMA; 01/08/2020 10:38 AM) No Known Drug Allergies  [12/09/2019]: Allergies Reconciled   Medication History [facility], RMA; 01/08/2020 10:38 AM) Symbicort (80-4.5MCG/ACT Aerosol, Inhalation) Active. Albuterol (Inhalation) Specific strength unknown - Active. Medications Reconciled    Review of Systems Axel Filler, MD;  01/08/2020 11:10 AM) General Not Present- Appetite Loss, Chills, Fatigue, Fever, Night Sweats, Weight Gain and Weight Loss. Skin Not Present- Change in Wart/Mole, Dryness, Hives, Jaundice, New Lesions, Non-Healing Wounds, Rash and Ulcer. HEENT Present- Wears glasses/contact lenses. Not Present- Earache, Hearing Loss, Hoarseness, Nose Bleed, Oral Ulcers, Ringing in the Ears, Seasonal Allergies, Sinus Pain, Sore Throat, Visual Disturbances and Yellow Eyes. Respiratory Not Present- Bloody sputum, Chronic Cough, Difficulty Breathing, Snoring and Wheezing. Breast Not Present- Breast Mass, Breast Pain, Nipple Discharge and Skin Changes. Cardiovascular Not Present- Chest Pain, Difficulty Breathing Lying Down, Leg Cramps, Palpitations, Rapid Heart Rate, Shortness of Breath and Swelling of Extremities. Gastrointestinal Not Present- Abdominal Pain, Bloating, Bloody Stool, Change in Bowel Habits, Chronic diarrhea, Constipation, Difficulty Swallowing, Excessive gas, Gets full quickly at meals, Hemorrhoids, Indigestion, Nausea, Rectal Pain and Vomiting. Female Genitourinary Present- Pelvic Pain. Not Present- Frequency, Nocturia, Painful Urination and Urgency. Musculoskeletal Present- Muscle Weakness. Not Present- Back Pain, Joint Pain, Joint Stiffness, Muscle Pain and Swelling of Extremities. Neurological Not Present- Decreased Memory, Fainting, Headaches, Numbness, Seizures, Tingling, Tremor, Trouble walking and Weakness. Psychiatric Not Present- Anxiety, Bipolar, Change in Sleep Pattern, Depression, Fearful and Frequent crying. Hematology Not Present- Blood Thinners, Easy Bruising, Excessive bleeding, Gland problems, HIV and Persistent Infections. All other systems negative  Vitals Adela Lank Haggett RMA; 01/08/2020 10:38 AM) 01/08/2020 10:38 AM Weight: 159 lb Height: 62in Body Surface Area: 1.73 m Body Mass Index: 29.08 kg/m  Temp.: 98.29F (Temporal)  Pulse: 100 (Regular)  P.OX: 99% (Room  air) BP: 142/80(Sitting, Left Arm, Standard)       Physical Exam Axel Filler MD; 01/08/2020 11:07 AM) The physical exam findings are as follows: Note: Constitutional: No acute distress, conversant, appears stated age  Eyes: Anicteric sclerae, moist conjunctiva, no lid lag  Neck: No thyromegaly, trachea midline, no cervical lymphadenopathy  Lungs: Clear to auscultation biilaterally, normal respiratory effot  Cardiovascular: regular rate & rhythm, no murmurs, no peripheal edema, pedal pulses 2+  GI: Soft, no masses or hepatosplenomegaly, non-tender to palpation  MSK: Normal gait, no clubbing cyanosis, edema  Skin: No rashes, palpation reveals normal skin turgor  Psychiatric: Appropriate judgment and insight, oriented to person, place, and time  Abdomen Inspection Hernias - Inguinal hernia - Right - Note: There is no hernia with Valsalva, there is some point tenderness lateral to the inguinal area and anterior to the ASIS.    Assessment & Plan Axel Filler MD; 01/08/2020 11:10 AM) SPORTS HERNIA, SUBSEQUENT ENCOUNTER (S39.81XD) Impression: patient is a 39 year old female, with a right adductor muscle tear, likely secondary to muscle injury. Patient has been undergoing PT. She has had some decrease in pain secondary to cortisone shot. Patient has had some concern of possible ventral hernia as well. 1. We will schedule her for a left scopic right inguinal hernia repair withMesh 2. We'll order a CT scan to evaluate for possible repair with Ventral hernia. All risks and benefits were discussed with the patient to generally include, but not limited to: infection, bleeding, damage to surrounding structures, acute and chronic nerve pain, and recurrence. Alternatives were offered and described. All questions were answered and the patient voiced understanding of the procedure and wishes to proceed at this point with hernia repair.

## 2020-01-12 ENCOUNTER — Ambulatory Visit: Payer: BC Managed Care – PPO

## 2020-01-12 ENCOUNTER — Other Ambulatory Visit: Payer: Self-pay

## 2020-01-12 DIAGNOSIS — M6281 Muscle weakness (generalized): Secondary | ICD-10-CM | POA: Diagnosis not present

## 2020-01-12 DIAGNOSIS — M25551 Pain in right hip: Secondary | ICD-10-CM | POA: Diagnosis not present

## 2020-01-12 NOTE — Therapy (Signed)
[place] [facility] PHYS[facility] AND SPORTS MEDICINE [address], [place], [address] Phone: [phone]   Fax:  [phone]  Physical Therapy Treatment  Patient Details  Name: Nicole Owens MRN: [id number] Date of Birth: [birth year] Referring Provider (PT): [name], MD   Encounter Date: 01/12/2020   PT End of Session - 01/12/20 0808    Visit Number 46    Number of Visits 51    Date for PT Re-Evaluation 02/12/20    PT Start Time 0808    PT Stop Time 0842    PT Time Calculation (min) 34 min    Activity Tolerance Patient tolerated treatment well    Behavior During Therapy [facility] for tasks assessed/performed           Past Medical History:  Diagnosis Date  . Asthma   . Asthma, intermittent    mild  . Carpal tunnel syndrome on both sides    with pregnancy  . Female infertility 04/30/2010   history restored after error with record merge  . Heartburn in pregnancy    nausea  . 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 Postpartum care following cesarean delivery 02/17/2012  . Rh negative, maternal 02/17/2012  . S/P cesarean section - triplets 11/29 02/17/2012    Past Surgical History:  Procedure Laterality Date  . CESAREAN SECTION  02/16/2012   Procedure: CESAREAN SECTION;  Surgeon: Lenoard Aden, MD;  Location: [facility];  Service: Obstetrics;  Laterality: N/A;  . NO PAST SURGERIES    . WISDOM TOOTH EXTRACTION      There were no vitals filed for this visit.   Subjective Assessment - 01/12/20 0810    Subjective R hip is good. No pain or discomfort. Getting surgery for her sports hernia. Has a CT scan for her abdomen this week.    Pertinent History R hip pain. Pain began mid November 2020. Pt was at the gym. Pt was performing scissor kicks. Felt a little pain more than normal. Went for a backward lunge with R LE and felt sharp pain R lower abdomen. Took 10 days off and iced that area. Did not really get better. Was diagnosed with sports  hernia. Still getting pain but getting a little better. Could not jog before 6 weeks ago. Thinks she can jog slowly now. Using her R LE to stand up from a R half kneeling position bothers her. Pt goes to the gym 6 days a week before, no goes to the gym 1 time a week and performs modified upper body work out. Pivoting the wrong way sometimes would hurt. Going up the steps 2 at a time bothers her R hip. Getting out of bed involving SLR hip flexion movement would bother it. Coughing, sneezing would bother R hip. Had C section 7 years ago for triplets. The pain is along the line of the incision.    Currently in Pain? No/denies    Pain Score 0-No pain                                     PT Education - 01/12/20 1058    Education Details ther-ex    Person(s) Educated Patient    Methods Explanation;Demonstration;Tactile cues;Verbal cues    Comprehension Returned demonstration;Verbalized understanding           Objectives  No latex allergies  01/26/2020 next appointment with Dr. Eulah Pont  MedbridgeAccess Code: [email or web address]  Pt wants to work up to being able to do planks, pushups, sit ups, spider and mountain climbers, jumps    Therapeutic exercise  Supine posterior pelvic tilt 10x10 seconds  Supine transversus abdominis contraction 10x10 seconds    Then with mini crunch 10x3   B shoulder extension isometrics 10x3 with 5 second holds     Then with isometric upper trunk R and L rotation 10x3 each with shoulder in 90 degrees flexion   Then with isometrics B shoulder extension isometrics 10x3 with 5 second holds  Sitting with upright posture with transversus abdomins contraction  Manual PT perturbation 1.5 minute x 2  L side bend isometrics 10x3 with 5 second holds  L upper trunk rotation isometrics in neutral to promote R lower trunk rotation for more neutral posture 10x5 seconds for 3 sets   Improved exercise  technique, movement at target joints, use of target muscles aftermin tomod verbal, visual, tactile cues.    Response to treatment Pt tolerated session well without aggravation of symptoms.    Clinical presentation Possible decrease in diastasis recti with gentle crunch exercise with activation of transversus abdominis muscle. Continued working on gently improving core strength to help improve ability to perform R hip flexion with less difficulty. Pt tolerated session well without aggravation of symptoms. Pt will benefit from continued skilled physical therapy services to decrease pain, improve strength and function.         PT Short Term Goals - 05/01/19 1149      PT SHORT TERM GOAL #1   Title Patient will be independent with her HEP to decrease pain, improve strength and function.    Baseline Pt has started her HEP (03/19/2019); Pt demonstrates independence with her HEP (05/01/2019)    Time 3    Period Weeks    Status Achieved    Target Date 04/10/19             PT Long Term Goals - 12/31/19 0847      PT LONG TERM GOAL #1   Title Pt will have a decrease in R hip pain to 2/10 or less at worst to promote ability to run, perform exercises, stand up from a half kneeling position more comfortably.    Baseline 6/10 R hip pain at most (03/19/2019); 2-3/10 at most for the past 7 days (05/01/2019); 2-3/10 at most for the past 7 days (05/12/19), (05/26/2019); 3/10 (07/08/2019); 4/10 at most for the past 7 days (after workout at her gym and softball)  2-3/10 at most for the past 7 days. (11/04/2019); 4/10  R hip pain at most for the past 7 days (12/31/2019)    Time 6    Period Weeks    Status On-going    Target Date 02/12/20      PT LONG TERM GOAL #2   Title Patient will improve R hip flexion, extension, abduction, and adduction strength by at least 1/2 MMT grade to promote ability to perform functional tasks more comfortably.    Baseline improved by 1/2 MMT grade but pain with  hip adduction (05/26/2019); maintained except hip extension (07/08/2019); impmroved strength except hip extension (11/04/2019); improved strength at all target planes (12/31/2019)    Time 8    Period Weeks    Status Achieved    Target Date 01/01/20      PT LONG TERM GOAL #3   Title Pt will be able to jog for at least 5 minutes without R hip pain to  promote ability to participate in exercise and promote physical health    Baseline increased R hip pain when jogging or running (03/19/2019); able to perform mini jog with short stride length (05/01/2019), (05/26/2019); Able to jog about 5 min except for this past Monday (07/08/2019); Pt states being able to jog, but does not know if it is painless (09/01/2019); able to jog at treadmil speed 4.5 normal stride length, no pain (11/04/2019)    Time 6    Period Weeks    Status Achieved    Target Date 01/01/20                 Plan - 01/12/20 1058    Clinical Impression Statement Possible decrease in diastasis recti with gentle crunch exercise with activation of transversus abdominis muscle. Continued working on gently improving core strength to help improve ability to perform R hip flexion with less difficulty. Pt tolerated session well without aggravation of symptoms. Pt will benefit from continued skilled physical therapy services to decrease pain, improve strength and function.    Personal Factors and Comorbidities Comorbidity 1    Comorbidities hx of C-section    Examination-Activity Limitations Squat    Stability/Clinical Decision Making Stable/Uncomplicated    Rehab Potential Good    PT Frequency 1x / week    PT Duration 6 weeks    PT Treatment/Interventions Therapeutic activities;Therapeutic exercise;Neuromuscular re-education;Patient/family education;Manual techniques;Dry needling;Spinal Manipulations;Joint Manipulations;Aquatic Therapy;Electrical Stimulation;Iontophoresis 4mg /ml Dexamethasone;Ultrasound    PT Next Visit Plan isometric, then  concentric, then eccentric hip and abdominal strengthening, manual techniques, modalities PRN    PT Home Exercise Plan Medbridge Access Code: [email or web address]    Consulted and Agree with Plan of Care Patient           Patient will benefit from skilled therapeutic intervention in order to improve the following deficits and impairments:  Pain, Improper body mechanics, Postural dysfunction, Decreased strength  Visit Diagnosis: Pain in right hip  Muscle weakness (generalized)     Problem List Patient Active Problem List   Diagnosis Date Noted  . S/P cesarean section - triplets 11/29 02/17/2012  . Postpartum care following cesarean delivery (11/29) 02/17/2012  . Rh negative, maternal 02/17/2012  . 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 Routine general medical examination at a health care facility 06/23/2010  . FIBROCYSTIC BREAST DISEASE 07/17/2008  . Asthma, 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    06/19/2008 PT, DPT   01/12/2020, 11:04 AM  [place] [facility] PHYSICAL AND SPORTS MEDICINE [address], [place] Phone: [phone]   Fax:  [phone]  Name: ROSHANDA BALAZS MRN: Darrol Poke Date of Birth: Aug 02, 1980

## 2020-01-14 ENCOUNTER — Other Ambulatory Visit: Payer: Self-pay

## 2020-01-14 ENCOUNTER — Ambulatory Visit
Admission: RE | Admit: 2020-01-14 | Discharge: 2020-01-14 | Disposition: A | Discharge status: Home / Self Care | Payer: BC Managed Care – PPO | Source: Ambulatory Visit | Attending: General Surgery | Admitting: General Surgery

## 2020-01-14 DIAGNOSIS — S3981XD Other specified injuries of abdomen, subsequent encounter: Secondary | ICD-10-CM | POA: Diagnosis not present

## 2020-01-14 DIAGNOSIS — K469 Unspecified abdominal hernia without obstruction or gangrene: Secondary | ICD-10-CM | POA: Diagnosis not present

## 2020-01-14 DIAGNOSIS — K439 Ventral hernia without obstruction or gangrene: Secondary | ICD-10-CM | POA: Diagnosis not present

## 2020-01-16 DIAGNOSIS — D2362 Other benign neoplasm of skin of left upper limb, including shoulder: Secondary | ICD-10-CM | POA: Diagnosis not present

## 2020-01-16 DIAGNOSIS — D485 Neoplasm of uncertain behavior of skin: Secondary | ICD-10-CM | POA: Diagnosis not present

## 2020-01-16 DIAGNOSIS — C44519 Basal cell carcinoma of skin of other part of trunk: Secondary | ICD-10-CM | POA: Diagnosis not present

## 2020-01-20 ENCOUNTER — Other Ambulatory Visit: Payer: Self-pay

## 2020-01-20 ENCOUNTER — Ambulatory Visit: Payer: BC Managed Care – PPO | Attending: Family Medicine

## 2020-01-20 DIAGNOSIS — M25551 Pain in right hip: Secondary | ICD-10-CM | POA: Diagnosis not present

## 2020-01-20 DIAGNOSIS — M6281 Muscle weakness (generalized): Secondary | ICD-10-CM | POA: Diagnosis not present

## 2020-01-20 NOTE — Therapy (Signed)
[place] [facility] PHYSICAL [facility] SPORTS MEDICINE [address], [place], [address] Phone: [phone]   Fax:  [phone]  Physical Therapy Treatment  Patient Details  Name: Nicole Owens MRN: [id number] Date of Birth: [birth year] Referring Provider (PT): [name], MD   Encounter Date: 01/20/2020   PT End of Session - 01/20/20 1501    Visit Number 47    Number of Visits 51    Date for PT Re-Evaluation 02/12/20    PT Start Time 1501    PT Stop Time 1545    PT Time Calculation (min) 44 min    Activity Tolerance Patient tolerated treatment well    Behavior During Therapy [facility] for tasks assessed/performed           Past Medical History:  Diagnosis Date  . Asthma   . Asthma, intermittent    mild  . Carpal tunnel syndrome on both sides    with pregnancy  . Female infertility 04/30/2010   history restored after error with record merge  . Heartburn in pregnancy    nausea  . 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 Postpartum care following cesarean delivery 02/17/2012  . Rh negative, maternal 02/17/2012  . S/P cesarean section - triplets 11/29 02/17/2012    Past Surgical History:  Procedure Laterality Date  . CESAREAN SECTION  02/16/2012   Procedure: CESAREAN SECTION;  Surgeon: Lenoard Aden, MD;  Location: [facility];  Service: Obstetrics;  Laterality: N/A;  . NO PAST SURGERIES    . WISDOM TOOTH EXTRACTION      There were no vitals filed for this visit.   Subjective Assessment - 01/20/20 1502    Subjective R hip is pretty sore but walked a lot yesterday. 3-4/10 R hip pain currently.    Pertinent History R hip pain. Pain began mid November 2020. Pt was at the gym. Pt was performing scissor kicks. Felt a little pain more than normal. Went for a backward lunge with R LE and felt sharp pain R lower abdomen. Took 10 days off and iced that area. Did not really get better. Was diagnosed with sports hernia. Still getting pain but getting a  little better. Could not jog before 6 weeks ago. Thinks she can jog slowly now. Using her R LE to stand up from a R half kneeling position bothers her. Pt goes to the gym 6 days a week before, no goes to the gym 1 time a week and performs modified upper body work out. Pivoting the wrong way sometimes would hurt. Going up the steps 2 at a time bothers her R hip. Getting out of bed involving SLR hip flexion movement would bother it. Coughing, sneezing would bother R hip. Had C section 7 years ago for triplets. The pain is along the line of the incision.    Currently in Pain? Yes    Pain Score 4                                      PT Education - 01/20/20 1507    Education Details ther-ex    Person(s) Educated Patient    Methods Explanation;Demonstration;Tactile cues;Verbal cues    Comprehension Returned demonstration;Verbalized understanding          Objectives  No latex allergies  01/26/2020 next appointment with Dr. Eulah Pont   MedbridgeAccess Code: [email or web address]  Pt wants to work up  to being able to do planks, pushups, sit ups, spider and mountain climbers, jumps    Therapeutic exercise  Seated trunk rotation with transversus abdominis contraction with hands behind head 20x2 each direction   Standing PNF chops red band  To the L 10x3 with 5 second holds   To the R 10x3 with 5 seconds holds  Standing palloff press red band  R 10x5 seconds for 3 sets  L 10x5 seconds for 3 sets  Sitting with upright posture with sheet around abdomen to help decrease diastasis recti  Manually resisted trunk rotation   R 10x5 seconds for 3 sets   L 10x5 seconds  for 3 sets  Improved exercise technique, movement at target joints, use of target muscles aftermin tomod verbal, visual, tactile cues.    Response to treatment Pt tolerated session well without aggravation of symptoms.   Clinical presentation Worked on core strength, particularly the  transversus abdominis as well as obliques to help promote lumbopelvic control and strength. No R hip pain after session. Pt will benefit continued skilled physical therapy services to decrease pain, improve strength and function.         PT Short Term Goals - 05/01/19 1149      PT SHORT TERM GOAL #1   Title Patient will be independent with her HEP to decrease pain, improve strength and function.    Baseline Pt has started her HEP (03/19/2019); Pt demonstrates independence with her HEP (05/01/2019)    Time 3    Period Weeks    Status Achieved    Target Date 04/10/19             PT Long Term Goals - 12/31/19 0847      PT LONG TERM GOAL #1   Title Pt will have a decrease in R hip pain to 2/10 or less at worst to promote ability to run, perform exercises, stand up from a half kneeling position more comfortably.    Baseline 6/10 R hip pain at most (03/19/2019); 2-3/10 at most for the past 7 days (05/01/2019); 2-3/10 at most for the past 7 days (05/12/19), (05/26/2019); 3/10 (07/08/2019); 4/10 at most for the past 7 days (after workout at her gym and softball)  2-3/10 at most for the past 7 days. (11/04/2019); 4/10  R hip pain at most for the past 7 days (12/31/2019)    Time 6    Period Weeks    Status On-going    Target Date 02/12/20      PT LONG TERM GOAL #2   Title Patient will improve R hip flexion, extension, abduction, and adduction strength by at least 1/2 MMT grade to promote ability to perform functional tasks more comfortably.    Baseline improved by 1/2 MMT grade but pain with hip adduction (05/26/2019); maintained except hip extension (07/08/2019); impmroved strength except hip extension (11/04/2019); improved strength at all target planes (12/31/2019)    Time 8    Period Weeks    Status Achieved    Target Date 01/01/20      PT LONG TERM GOAL #3   Title Pt will be able to jog for at least 5 minutes without R hip pain to promote ability to participate in exercise and promote  physical health    Baseline increased R hip pain when jogging or running (03/19/2019); able to perform mini jog with short stride length (05/01/2019), (05/26/2019); Able to jog about 5 min except for this past Monday (07/08/2019); Pt states being able  to jog, but does not know if it is painless (09/01/2019); able to jog at treadmil speed 4.5 normal stride length, no pain (11/04/2019)    Time 6    Period Weeks    Status Achieved    Target Date 01/01/20                 Plan - 01/20/20 1856    Clinical Impression Statement Worked on core strength, particularly the transversus abdominis as well as obliques to help promote lumbopelvic control and strength. No R hip pain after session. Pt will benefit continued skilled physical therapy services to decrease pain, improve strength and function.    Personal Factors and Comorbidities Comorbidity 1    Comorbidities hx of C-section    Examination-Activity Limitations Squat    Stability/Clinical Decision Making Stable/Uncomplicated    Rehab Potential Good    PT Frequency 1x / week    PT Duration 6 weeks    PT Treatment/Interventions Therapeutic activities;Therapeutic exercise;Neuromuscular re-education;Patient/family education;Manual techniques;Dry needling;Spinal Manipulations;Joint Manipulations;Aquatic Therapy;Electrical Stimulation;Iontophoresis 4mg /ml Dexamethasone;Ultrasound    PT Next Visit Plan isometric, then concentric, then eccentric hip and abdominal strengthening, manual techniques, modalities PRN    PT Home Exercise Plan Medbridge Access Code: [email or web address]    Consulted and Agree with Plan of Care Patient           Patient will benefit from skilled therapeutic intervention in order to improve the following deficits and impairments:  Pain, Improper body mechanics, Postural dysfunction, Decreased strength  Visit Diagnosis: Pain in right hip  Muscle weakness (generalized)     Problem List Patient Active Problem List   Diagnosis Date  Noted  . S/P cesarean section - triplets 11/29 02/17/2012  . Postpartum care following cesarean delivery (11/29) 02/17/2012  . Rh negative, maternal 02/17/2012  . 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 Routine general medical examination at a health care facility 06/23/2010  . FIBROCYSTIC BREAST DISEASE 07/17/2008  . Asthma, 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    06/19/2008 PT, DPT   01/20/2020, 7:02 PM  [place] [facility] PHYSICAL AND SPORTS MEDICINE [address], [place] Phone: [phone]   Fax:  [phone]  Name: ANASTACIA REINECKE MRN: Darrol Poke Date of Birth: [birth year]

## 2020-01-21 DIAGNOSIS — M25551 Pain in right hip: Secondary | ICD-10-CM | POA: Diagnosis not present

## 2020-01-21 DIAGNOSIS — M24151 Other articular cartilage disorders, right hip: Secondary | ICD-10-CM | POA: Diagnosis not present

## 2020-01-28 ENCOUNTER — Ambulatory Visit: Payer: BC Managed Care – PPO

## 2020-01-28 ENCOUNTER — Other Ambulatory Visit: Payer: Self-pay

## 2020-01-28 DIAGNOSIS — M6281 Muscle weakness (generalized): Secondary | ICD-10-CM

## 2020-01-28 DIAGNOSIS — M25551 Pain in right hip: Secondary | ICD-10-CM | POA: Diagnosis not present

## 2020-01-28 NOTE — Patient Instructions (Addendum)
Seated R hip flexion with slight leg extension (short lever arm)   2-3x3. Difficult. No pain  Reviewed and given as part of her HEP. Pt demonstrated and verbalized understanding.    To be performed every other day.

## 2020-01-28 NOTE — Therapy (Signed)
[place] [facility] PHYS[facility] SPORTS MEDICINE [address], [place], [address] Phone: [phone]   Fax:  [phone]  Physical Therapy Treatment  Patient Details  Name: Nicole Owens MRN: [id number] Date of Birth: 12-28-1980 Referring Provider (PT): [name], MD   Encounter Date: 01/28/2020   PT End of Session - 01/28/20 0805    Visit Number 48    Number of Visits 51    Date for PT Re-Evaluation 02/12/20    PT Start Time 0805    PT Stop Time 0848    PT Time Calculation (min) 43 min    Activity Tolerance Patient tolerated treatment well    Behavior During Therapy [facility] for tasks assessed/performed           Past Medical History:  Diagnosis Date  . Asthma   . Asthma, intermittent    mild  . Carpal tunnel syndrome on both sides    with pregnancy  . Female infertility 04/30/2010   history restored after error with record merge  . Heartburn in pregnancy    nausea  . 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 Postpartum care following cesarean delivery 02/17/2012  . Rh negative, maternal 02/17/2012  . S/P cesarean section - triplets 11/29 02/17/2012    Past Surgical History:  Procedure Laterality Date  . CESAREAN SECTION  02/16/2012   Procedure: CESAREAN SECTION;  Surgeon: Lenoard Aden, MD;  Location: [facility];  Service: Obstetrics;  Laterality: N/A;  . NO PAST SURGERIES    . WISDOM TOOTH EXTRACTION      There were no vitals filed for this visit.   Subjective Assessment - 01/28/20 0806    Subjective R hip is good, no pain or discomfort.    Pertinent History R hip pain. Pain began mid November 2020. Pt was at the gym. Pt was performing scissor kicks. Felt a little pain more than normal. Went for a backward lunge with R LE and felt sharp pain R lower abdomen. Took 10 days off and iced that area. Did not really get better. Was diagnosed with sports hernia. Still getting pain but getting a little better. Could not jog before 6  weeks ago. Thinks she can jog slowly now. Using her R LE to stand up from a R half kneeling position bothers her. Pt goes to the gym 6 days a week before, no goes to the gym 1 time a week and performs modified upper body work out. Pivoting the wrong way sometimes would hurt. Going up the steps 2 at a time bothers her R hip. Getting out of bed involving SLR hip flexion movement would bother it. Coughing, sneezing would bother R hip. Had C section 7 years ago for triplets. The pain is along the line of the incision.    Currently in Pain? No/denies    Pain Score 0-No pain                                     PT Education - 01/28/20 0808    Education Details ther-ex    Person(s) Educated Patient    Methods Explanation;Demonstration;Tactile cues;Verbal cues    Comprehension Returned demonstration;Verbalized understanding          Objectives  No latex allergies     MedbridgeAccess Code: [email or web address]  Pt wants to work up to being able to do planks, pushups, sit ups, spider and mountain  climbers, jumps    Therapeutic exercise  Seated trunk rotation with transversus abdominis contraction with hands behind head 20x2 each direction   Standing palloff press double red band             R 10x5 seconds for 3 sets             L 10x5 seconds for 3 sets  Sitting with upright posture manually resisted trunk rotation                         R 10x5 seconds for 3 sets                         L 10x5 seconds  for 3 sets  Standing PNF chops red band             To the L 10x3 with 5 second holds              To the R 10x3 with 5 seconds holds   Seated R hip flexion with slight leg extension (short lever arm)   3x2. Difficult. No pain  Reviewed and given as part of her HEP. Pt demonstrated and verbalized understanding.   Improved exercise technique, movement at target joints, use of target muscles aftermin tomod verbal, visual, tactile  cues.    Response to treatment Pt tolerated session well without aggravation of symptoms.   Clinical presentation Continued working on core strength to promote stability for R hip flexion movement. Difficulty with seated R hip flexion with slight leg extension but no pain. Gave exercise as part of HEP to promote strength. Pt tolerated session well without aggravation of symptoms. Pt will benefit from continued skilled physical therapy services to improve strength and function.      PT Short Term Goals - 05/01/19 1149      PT SHORT TERM GOAL #1   Title Patient will be independent with her HEP to decrease pain, improve strength and function.    Baseline Pt has started her HEP (03/19/2019); Pt demonstrates independence with her HEP (05/01/2019)    Time 3    Period Weeks    Status Achieved    Target Date 04/10/19             PT Long Term Goals - 12/31/19 0847      PT LONG TERM GOAL #1   Title Pt will have a decrease in R hip pain to 2/10 or less at worst to promote ability to run, perform exercises, stand up from a half kneeling position more comfortably.    Baseline 6/10 R hip pain at most (03/19/2019); 2-3/10 at most for the past 7 days (05/01/2019); 2-3/10 at most for the past 7 days (05/12/19), (05/26/2019); 3/10 (07/08/2019); 4/10 at most for the past 7 days (after workout at her gym and softball)  2-3/10 at most for the past 7 days. (11/04/2019); 4/10  R hip pain at most for the past 7 days (12/31/2019)    Time 6    Period Weeks    Status On-going    Target Date 02/12/20      PT LONG TERM GOAL #2   Title Patient will improve R hip flexion, extension, abduction, and adduction strength by at least 1/2 MMT grade to promote ability to perform functional tasks more comfortably.    Baseline improved by 1/2 MMT grade but pain with hip adduction (05/26/2019); maintained except hip extension (  07/08/2019); impmroved strength except hip extension (11/04/2019); improved strength at all  target planes (12/31/2019)    Time 8    Period Weeks    Status Achieved    Target Date 01/01/20      PT LONG TERM GOAL #3   Title Pt will be able to jog for at least 5 minutes without R hip pain to promote ability to participate in exercise and promote physical health    Baseline increased R hip pain when jogging or running (03/19/2019); able to perform mini jog with short stride length (05/01/2019), (05/26/2019); Able to jog about 5 min except for this past Monday (07/08/2019); Pt states being able to jog, but does not know if it is painless (09/01/2019); able to jog at treadmil speed 4.5 normal stride length, no pain (11/04/2019)    Time 6    Period Weeks    Status Achieved    Target Date 01/01/20                 Plan - 01/28/20 0809    Clinical Impression Statement Continued working on core strength to promote stability for R hip flexion movement. Difficulty with seated R hip flexion with slight leg extension but no pain. Gave exercise as part of HEP to promote strength. Pt tolerated session well without aggravation of symptoms. Pt will benefit from continued skilled physical therapy services to improve strength and function.    Personal Factors and Comorbidities Comorbidity 1    Comorbidities hx of C-section    Examination-Activity Limitations Squat    Stability/Clinical Decision Making Stable/Uncomplicated    Rehab Potential Good    PT Frequency 1x / week    PT Duration 6 weeks    PT Treatment/Interventions Therapeutic activities;Therapeutic exercise;Neuromuscular re-education;Patient/family education;Manual techniques;Dry needling;Spinal Manipulations;Joint Manipulations;Aquatic Therapy;Electrical Stimulation;Iontophoresis 4mg /ml Dexamethasone;Ultrasound    PT Next Visit Plan isometric, then concentric, then eccentric hip and abdominal strengthening, manual techniques, modalities PRN    PT Home Exercise Plan Medbridge Access Code: [email or web address]    Consulted and Agree with Plan of Care  Patient           Patient will benefit from skilled therapeutic intervention in order to improve the following deficits and impairments:  Pain, Improper body mechanics, Postural dysfunction, Decreased strength  Visit Diagnosis: Pain in right hip  Muscle weakness (generalized)     Problem List Patient Active Problem List   Diagnosis Date Noted  . S/P cesarean section - triplets 11/29 02/17/2012  . Postpartum care following cesarean delivery (11/29) 02/17/2012  . Rh negative, maternal 02/17/2012  . 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 Routine general medical examination at a health care facility 06/23/2010  . FIBROCYSTIC BREAST DISEASE 07/17/2008  . Asthma, 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    06/19/2008 PT, DPT   01/28/2020, 9:09 AM  [place] [facility] PHYSICAL AND SPORTS MEDICINE [address], [address], [place] Phone: [phone]   Fax:  [phone]  Name: ROBECCA FULGHAM MRN: Darrol Poke Date of Birth: 04-10-1980

## 2020-01-30 DIAGNOSIS — C44519 Basal cell carcinoma of skin of other part of trunk: Secondary | ICD-10-CM | POA: Diagnosis not present

## 2020-02-04 ENCOUNTER — Ambulatory Visit: Payer: BC Managed Care – PPO

## 2020-02-04 ENCOUNTER — Other Ambulatory Visit: Payer: Self-pay

## 2020-02-04 DIAGNOSIS — M6281 Muscle weakness (generalized): Secondary | ICD-10-CM

## 2020-02-04 DIAGNOSIS — M25551 Pain in right hip: Secondary | ICD-10-CM | POA: Diagnosis not present

## 2020-02-04 NOTE — Therapy (Signed)
[place] [facility]ORTS MEDICINE [address] [place], [address] Phone: [phone]   Fax:  [phone]  Physical Therapy Treatment  Patient Details  Name: Nicole Owens MRN: [id number] Date of Birth: 1980-04-22 Referring Provider (PT): [name], MD   Encounter Date: 02/04/2020   PT End of Session - 02/04/20 1023    Visit Number 49    Number of Visits 51    Date for PT Re-Evaluation 02/12/20    PT Start Time 1023    PT Stop Time 1107    PT Time Calculation (min) 44 min    Activity Tolerance Patient tolerated treatment well    Behavior During Therapy [facility] for tasks assessed/performed           Past Medical History:  Diagnosis Date  . Asthma   . Asthma, intermittent    mild  . Carpal tunnel syndrome on both sides    with pregnancy  . Female infertility 04/30/2010   history restored after error with record merge  . Heartburn in pregnancy    nausea  . 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 Postpartum care following cesarean delivery 02/17/2012  . Rh negative, maternal 02/17/2012  . S/P cesarean section - triplets 11/29 02/17/2012    Past Surgical History:  Procedure Laterality Date  . CESAREAN SECTION  02/16/2012   Procedure: CESAREAN SECTION;  Surgeon: Lenoard Aden, MD;  Location: [facility];  Service: Obstetrics;  Laterality: N/A;  . NO PAST SURGERIES    . WISDOM TOOTH EXTRACTION      There were no vitals filed for this visit.   Subjective Assessment - 02/04/20 1024    Subjective Doing ok. R hip hurts every now and again such as when walking in a store.    Pertinent History R hip pain. Pain began mid November 2020. Pt was at the gym. Pt was performing scissor kicks. Felt a little pain more than normal. Went for a backward lunge with R LE and felt sharp pain R lower abdomen. Took 10 days off and iced that area. Did not really get better. Was diagnosed with sports hernia. Still getting pain but getting a  little better. Could not jog before 6 weeks ago. Thinks she can jog slowly now. Using her R LE to stand up from a R half kneeling position bothers her. Pt goes to the gym 6 days a week before, no goes to the gym 1 time a week and performs modified upper body work out. Pivoting the wrong way sometimes would hurt. Going up the steps 2 at a time bothers her R hip. Getting out of bed involving SLR hip flexion movement would bother it. Coughing, sneezing would bother R hip. Had C section 7 years ago for triplets. The pain is along the line of the incision.    Currently in Pain? No/denies    Pain Score 0-No pain                                     PT Education - 02/04/20 1028    Education Details ther-ex    Person(s) Educated Patient    Methods Explanation;Demonstration;Tactile cues;Verbal cues    Comprehension Returned demonstration;Verbalized understanding          Objectives  No latex allergies     MedbridgeAccess Code: [email or web address]  Pt wants to work up to being able to  do planks, pushups, sit ups, spider and mountain climbers, jumps    Therapeutic exercise  Seated trunk rotation with transversus abdominis contraction with hands behind head 10x3 each direction   and PT manual resistance  Seated hip extension isometrics   R 10x10 seconds   Standing R hip extension with R foot on slider with R UE assist   3x   Very difficult in closed chain with foot pressure on floor resistance, no difficulty in open chain   Seated R knee flexion with foot on slider, pressing down   2x. Difficult  Then without pressing down, still difficult but better able to perform 1x, then 3x  Seated shoulder extension isometrics, hand on thigh  R 10x3 with 5 second holds   Decreased R lumbar paraspinal muscle tension palpated.   Seated R piriformis stretch 30 seconds x 3  Seated R hip IR 10x3  Sitting with upright posture manually resisted trunk rotation isometrics  in neutral  R 10x5 seconds for 3 sets L 10x5 seconds for 3 sets   Seated R hip flexion with slight leg extension (short lever arm)              5x, then 3x  Difficult. No pain     Improved exercise technique, movement at target joints, use of target muscles aftermin tomod verbal, visual, tactile cues.    Response to treatment Pt tolerated session well without aggravation of symptoms.   Clinical presentation Pt demonstrates difficulty with hamstrings muscle activation against resistance today, having difficulty with knee flexion exercises while pressing down on slider. Pt also demonstrates difficulty with hip flexion with slightly longer lever arm in sitting. Worked on improving trunk, hamstrings, and hip flexor strength to help address. Pt tolerated session well without aggravation of symptoms. Pt will benefit from continued skilled physical therapy services to decrease pain, improve strength and function.        PT Short Term Goals - 05/01/19 1149      PT SHORT TERM GOAL #1   Title Patient will be independent with her HEP to decrease pain, improve strength and function.    Baseline Pt has started her HEP (03/19/2019); Pt demonstrates independence with her HEP (05/01/2019)    Time 3    Period Weeks    Status Achieved    Target Date 04/10/19             PT Long Term Goals - 12/31/19 0847      PT LONG TERM GOAL #1   Title Pt will have a decrease in R hip pain to 2/10 or less at worst to promote ability to run, perform exercises, stand up from a half kneeling position more comfortably.    Baseline 6/10 R hip pain at most (03/19/2019); 2-3/10 at most for the past 7 days (05/01/2019); 2-3/10 at most for the past 7 days (05/12/19), (05/26/2019); 3/10 (07/08/2019); 4/10 at most for the past 7 days (after workout at her gym and softball)  2-3/10 at most for the past 7 days. (11/04/2019); 4/10  R hip pain at most for the past  7 days (12/31/2019)    Time 6    Period Weeks    Status On-going    Target Date 02/12/20      PT LONG TERM GOAL #2   Title Patient will improve R hip flexion, extension, abduction, and adduction strength by at least 1/2 MMT grade to promote ability to perform functional tasks more comfortably.    Baseline improved  by 1/2 MMT grade but pain with hip adduction (05/26/2019); maintained except hip extension (07/08/2019); impmroved strength except hip extension (11/04/2019); improved strength at all target planes (12/31/2019)    Time 8    Period Weeks    Status Achieved    Target Date 01/01/20      PT LONG TERM GOAL #3   Title Pt will be able to jog for at least 5 minutes without R hip pain to promote ability to participate in exercise and promote physical health    Baseline increased R hip pain when jogging or running (03/19/2019); able to perform mini jog with short stride length (05/01/2019), (05/26/2019); Able to jog about 5 min except for this past Monday (07/08/2019); Pt states being able to jog, but does not know if it is painless (09/01/2019); able to jog at treadmil speed 4.5 normal stride length, no pain (11/04/2019)    Time 6    Period Weeks    Status Achieved    Target Date 01/01/20                 Plan - 02/04/20 1029    Clinical Impression Statement Pt demonstrates difficulty with hamstrings muscle activation against resistance today, having difficulty with knee flexion exercises while pressing down on slider. Pt also demonstrates difficulty with hip flexion with slightly longer lever arm in sitting. Worked on improving trunk, hamstrings, and hip flexor strength to help address. Pt tolerated session well without aggravation of symptoms. Pt will benefit from continued skilled physical therapy services to decrease pain, improve strength and function.    Personal Factors and Comorbidities Comorbidity 1    Comorbidities hx of C-section    Examination-Activity Limitations Squat     Stability/Clinical Decision Making Stable/Uncomplicated    Rehab Potential Good    PT Frequency 1x / week    PT Duration 6 weeks    PT Treatment/Interventions Therapeutic activities;Therapeutic exercise;Neuromuscular re-education;Patient/family education;Manual techniques;Dry needling;Spinal Manipulations;Joint Manipulations;Aquatic Therapy;Electrical Stimulation;Iontophoresis 4mg /ml Dexamethasone;Ultrasound    PT Next Visit Plan isometric, then concentric, then eccentric hip and abdominal strengthening, manual techniques, modalities PRN    PT Home Exercise Plan Medbridge Access Code: [email or web address]    Consulted and Agree with Plan of Care Patient           Patient will benefit from skilled therapeutic intervention in order to improve the following deficits and impairments:  Pain, Improper body mechanics, Postural dysfunction, Decreased strength  Visit Diagnosis: Pain in right hip  Muscle weakness (generalized)     Problem List Patient Active Problem List   Diagnosis Date Noted  . S/P cesarean section - triplets 11/29 02/17/2012  . Postpartum care following cesarean delivery (11/29) 02/17/2012  . Rh negative, maternal 02/17/2012  . 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 Routine general medical examination at a health care facility 06/23/2010  . FIBROCYSTIC BREAST DISEASE 07/17/2008  . Asthma, 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   06/19/2008 PT, DPT   02/04/2020, 11:14 AM  [place] [facility] PHYSICAL AND SPORTS MEDICINE [address], [place] Phone: [phone]   Fax:  [phone]  Name: NEVAYAH FAUST MRN: Darrol Poke Date of Birth: February 15, 1981

## 2020-02-06 ENCOUNTER — Other Ambulatory Visit: Payer: Self-pay

## 2020-02-06 ENCOUNTER — Encounter: Payer: Self-pay | Admitting: Plastic Surgery

## 2020-02-06 ENCOUNTER — Ambulatory Visit (INDEPENDENT_AMBULATORY_CARE_PROVIDER_SITE_OTHER): Payer: BC Managed Care – PPO | Admitting: Plastic Surgery

## 2020-02-06 DIAGNOSIS — M6208 Separation of muscle (nontraumatic), other site: Secondary | ICD-10-CM | POA: Diagnosis not present

## 2020-02-06 HISTORY — DX: Separation of muscle (nontraumatic), other site: M62.08

## 2020-02-06 NOTE — Progress Notes (Signed)
Patient ID: Nicole Owens, female    DOB: 1980/09/09, 38 y.o.   MRN: [id number]   Chief Complaint  Patient presents with  . Consult  . Skin Problem    The patient is a 39 year old female here for evaluation of her abdomen.  She is 5 feet 2 inches tall and weighs 157 pounds.  She does not have diabetes.  She is not a smoker and she is not on blood thinner.  The patient had triplets in 2013 via a C-section.  Since then she has had extreme rectus diastases.  She has a large hernia.  The diastases and abdominal loss of domain is remarkable.  She works out regularly and has done everything she has known to do for strengthening her abdominal muscles.  She is otherwise in good health.  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    Review of Systems  Constitutional: Positive for activity change. Negative for appetite change.  HENT: Negative.   Eyes: Negative.   Respiratory: Negative.  Negative for chest tightness and shortness of breath.   Cardiovascular: Negative for leg swelling.  Gastrointestinal: Positive for abdominal distention. Negative for blood in stool and constipation.  Endocrine: Negative.   Genitourinary: Negative.   Musculoskeletal: Negative.   Neurological: Negative.   Hematological: Negative.   Psychiatric/Behavioral: Negative.     Past Medical History:  Diagnosis Date  . Asthma   . Asthma, intermittent    mild  . Carpal tunnel syndrome on both sides    with pregnancy  . Female infertility 04/30/2010   history restored after error with record merge  . Heartburn in pregnancy    nausea  . 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 Postpartum care following cesarean delivery 02/17/2012  . Rh negative, maternal 02/17/2012  . S/P cesarean section - triplets 11/29 02/17/2012    Past Surgical History:  Procedure Laterality Date  . CESAREAN SECTION  02/16/2012   Procedure: CESAREAN SECTION;  Surgeon: Lenoard Aden, MD;  Location: [facility];  Service:  Obstetrics;  Laterality: N/A;  . NO PAST SURGERIES    . WISDOM TOOTH EXTRACTION        Current Outpatient Medications:  .  albuterol (VENTOLIN HFA) 108 (90 Base) MCG/ACT inhaler, INHALE TWO PUFFS BY MOUTH THREE TIMES DAILY AS NEEDED FOR WHEEZING OR  SHORTNESS  OF  BREATH, Disp: 18 g, Rfl: 3 .  budesonide-formoterol (SYMBICORT) 80-4.5 MCG/ACT inhaler, Inhale 2 puffs into the lungs 2 (two) times daily., Disp: 30.6 g, Rfl: 3   Objective:   Vitals:   02/06/20 0854  BP: 136/81  Pulse: 76  Temp: 97.9 F (36.6 C)  SpO2: 100%    Physical Exam Vitals and nursing note reviewed.  Constitutional:      Appearance: Normal appearance.  HENT:     Head: Normocephalic and atraumatic.  Cardiovascular:     Rate and Rhythm: Normal rate.     Pulses: Normal pulses.  Pulmonary:     Effort: Pulmonary effort is normal. No respiratory distress.  Abdominal:     General: Abdomen is flat. There is distension.     Palpations: There is no mass.     Tenderness: There is abdominal tenderness. There is no rebound.     Hernia: A hernia is present.  Skin:    General: Skin is warm.     Capillary Refill: Capillary refill takes less than 2 seconds.  Neurological:     General: No focal deficit present.  Mental Status: She is alert and oriented to person, place, and time.  Psychiatric:        Mood and Affect: Mood normal.        Behavior: Behavior normal.        Thought Content: Thought content normal.     Assessment & Plan:  Diastasis of rectus abdominis  The patient has extreme rectus diastases.  She would benefit greatly from plication of her rectus muscle.  I will try work with Dr. Derrell Lolling to see if we can get this arranged for her.  Alena Bills Luis Nickles, DO

## 2020-02-10 ENCOUNTER — Telehealth: Payer: Self-pay | Admitting: Plastic Surgery

## 2020-02-10 NOTE — Telephone Encounter (Signed)
After discussing the lack of availability at [facility] with Morrie Sheldon and Steward Drone at [facility], I advised that I would (per my discussion with Dr. Yehuda Savannah) run this through insurance and see if I can secure an approval for diastasis repair and possibly panniculectomy - so that we can coordinate surgery with Dr. Derrell Lolling on 12/20.   I called the patient to explain the situation. I advised her that Dr. Ulice Bold and I had spoken, and that I will run the surgery through insurance for a diastasis repair and panniculectomy - as she wants the lower skin addressed, if possible. I reiterated that IF this surgery is approved, we will coordinate with Dr. Derrell Lolling for surgery on 12/20. However, the patient needs to be fully aware that we can ONLY do what is approved in a coordinated surgery being done at [facility]. She verbalized understanding and agreement.   She then asked me what the plan is if our portion of the surgery is denied. I explained that the OR availability at [facility] is limited due to their currently full schedule, and any additional operating time has been reduced significantly for staffing and OR purposes. I explained that if she wanted the diastasis repair/abdominoplasty done at the same time as the hernia repair, then our only option is to entertain a surgery in the new year. She understood and seemed to be accepting of that information. The patient also indicated that she is switching insurances from [place] to [place] on 03/21/2019, and will have to meet her deductible and out-of-pocket with her hip surgery regardless, so her waiting did not seem as devastating at this point.   I submitted the authorization request for diastasis repair and panniculectomy, using CPT codes 02409, 15847, 17999, and [id number]. I am not confident that we will get a favorable outcome for the diastasis repair with [employer], as their policy manual states, "Repair of diastasis recti is considered not medically necessary for all indications." However, we will wait  for the authorization determination to be rendered by insurance. I will update all parties involved as I receive information.

## 2020-02-11 ENCOUNTER — Ambulatory Visit: Payer: BC Managed Care – PPO

## 2020-02-18 ENCOUNTER — Ambulatory Visit: Payer: BC Managed Care – PPO | Attending: Family Medicine

## 2020-02-18 ENCOUNTER — Ambulatory Visit: Payer: BC Managed Care – PPO

## 2020-02-18 ENCOUNTER — Telehealth: Payer: Self-pay

## 2020-02-18 ENCOUNTER — Other Ambulatory Visit: Payer: Self-pay

## 2020-02-18 DIAGNOSIS — M6281 Muscle weakness (generalized): Secondary | ICD-10-CM | POA: Diagnosis not present

## 2020-02-18 DIAGNOSIS — M25551 Pain in right hip: Secondary | ICD-10-CM

## 2020-02-18 NOTE — Therapy (Signed)
[place] [facility] PHYSICAL AND SPORTS MEDICINE [address] [place], [address] Phone: [phone]   Fax:  [phone]  Physical Therapy Treatment  Patient Details  Name: Nicole Owens MRN: [id number] Date of Birth: 10/07/1980 Referring Provider (PT): [name], MD   Encounter Date: 02/18/2020   PT End of Session - 02/18/20 1351    Visit Number 50    Number of Visits 59    Date for PT Re-Evaluation 04/15/20    PT Start Time 1351    PT Stop Time 1430    PT Time Calculation (min) 39 min    Activity Tolerance Patient tolerated treatment well    Behavior During Therapy [facility] for tasks assessed/performed           Past Medical History:  Diagnosis Date  . Asthma   . Asthma, intermittent    mild  . Carpal tunnel syndrome on both sides    with pregnancy  . Female infertility 04/30/2010   history restored after error with record merge  . Heartburn in pregnancy    nausea  . 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 Postpartum care following cesarean delivery 02/17/2012  . Rh negative, maternal 02/17/2012  . S/P cesarean section - triplets 11/29 02/17/2012    Past Surgical History:  Procedure Laterality Date  . CESAREAN SECTION  02/16/2012   Procedure: CESAREAN SECTION;  Surgeon: Lenoard Aden, MD;  Location: [facility];  Service: Obstetrics;  Laterality: N/A;  . NO PAST SURGERIES    . WISDOM TOOTH EXTRACTION      There were no vitals filed for this visit.   Subjective Assessment - 02/18/20 1352    Subjective R hip is ok, not really having any pain or discomfort. Surgery is supposed to be on 02/25/2020.    Pertinent History R hip pain. Pain began mid November 2020. Pt was at the gym. Pt was performing scissor kicks. Felt a little pain more than normal. Went for a backward lunge with R LE and felt sharp pain R lower abdomen. Took 10 days off and iced that area. Did not really get better. Was diagnosed with sports hernia. Still getting  pain but getting a little better. Could not jog before 6 weeks ago. Thinks she can jog slowly now. Using her R LE to stand up from a R half kneeling position bothers her. Pt goes to the gym 6 days a week before, no goes to the gym 1 time a week and performs modified upper body work out. Pivoting the wrong way sometimes would hurt. Going up the steps 2 at a time bothers her R hip. Getting out of bed involving SLR hip flexion movement would bother it. Coughing, sneezing would bother R hip. Had C section 7 years ago for triplets. The pain is along the line of the incision.    Currently in Pain? No/denies                                     PT Education - 02/18/20 1422    Education Details ther-ex    Person(s) Educated Patient    Methods Explanation;Demonstration;Tactile cues;Verbal cues    Comprehension Returned demonstration;Verbalized understanding          Objectives  No latex allergies     MedbridgeAccess Code: [email or web address]  Pt wants to work up to being able to do planks, pushups, sit ups,  spider and mountain climbers, jumps    Therapeutic exercise   Seated trunk rotation isometrics with transversus abdominis contraction with hands behind head 10x3 each direction              and PT manual resistance  Supine R LE straight leg raise hip flexion 2x with posterior pelvic tilt assist with pillow to replicate abdominal use for posterior pelvic tilting. Able to perform compared without posterior pelvic tilt assist.   Then with PT assist 5x3  Prone manually resisted R knee flexion 10x  Prone R hip extension with R leg straight 5x. Very difficult  Improved ability to perform with transversus abdominis activation as well as when in prone on elbows position.   Prone press-ups 10x5 seconds for 2 sets  R S/L L hip adduction 7x, then 5x2. No pain   Reviewed plan of care: continue 1x/week for 8 weeks to continue strengthening to promote function and  in preparation for surgery.     Improved exercise technique, movement at target joints, use of target muscles aftermin tomod verbal, visual, tactile cues.    Response to treatment Pt tolerated session well without aggravation of symptoms.   Clinical presentation Pt demonstrates overall improved R hip comfort level and function. Main challenge at the moment seems to be weakness with hip flexion and hip extension. Improved ability to perform hip flexion in supine with leg straight with pelvis posteriorly tilted to simulate activation of abdominal muscles though difficult. Improved hip extension with activation of transversus abdominis muscles though still very difficult. No R hip pain throughout session. Pt will benefit from continued skilled physical therapy services to improve strength, function and in preparation for her surgery.       PT Short Term Goals - 05/01/19 1149      PT SHORT TERM GOAL #1   Title Patient will be independent with her HEP to decrease pain, improve strength and function.    Baseline Pt has started her HEP (03/19/2019); Pt demonstrates independence with her HEP (05/01/2019)    Time 3    Period Weeks    Status Achieved    Target Date 04/10/19             PT Long Term Goals - 02/18/20 1430      PT LONG TERM GOAL #1   Title Pt will have a decrease in R hip pain to 2/10 or less at worst to promote ability to run, perform exercises, stand up from a half kneeling position more comfortably.    Baseline 6/10 R hip pain at most (03/19/2019); 2-3/10 at most for the past 7 days (05/01/2019); 2-3/10 at most for the past 7 days (05/12/19), (05/26/2019); 3/10 (07/08/2019); 4/10 at most for the past 7 days (after workout at her gym and softball)  2-3/10 at most for the past 7 days. (11/04/2019); 4/10  R hip pain at most for the past 7 days (12/31/2019); 2/10 at most for the past 7 days (02/18/2020)    Time 6    Period Weeks    Status Achieved    Target Date  02/12/20      PT LONG TERM GOAL #2   Title Patient will improve R hip flexion, extension, abduction, and adduction strength by at least 1/2 MMT grade to promote ability to perform functional tasks more comfortably.    Baseline improved by 1/2 MMT grade but pain with hip adduction (05/26/2019); maintained except hip extension (07/08/2019); impmroved strength except hip extension (11/04/2019); improved strength  at all target planes (12/31/2019)    Time 8    Period Weeks    Status Achieved      PT LONG TERM GOAL #3   Title Pt will be able to jog for at least 5 minutes without R hip pain to promote ability to participate in exercise and promote physical health    Baseline increased R hip pain when jogging or running (03/19/2019); able to perform mini jog with short stride length (05/01/2019), (05/26/2019); Able to jog about 5 min except for this past Monday (07/08/2019); Pt states being able to jog, but does not know if it is painless (09/01/2019); able to jog at treadmil speed 4.5 normal stride length, no pain (11/04/2019)    Time 6    Period Weeks    Status Achieved      PT LONG TERM GOAL #4   Title Pt will be able to perform R SLR hip flexion 5x independently as a demonstration of improved strength.    Baseline Unable to perform one full repetition without posterior pelvic tilt assist from pillow. (12/1/201)    Time 8    Period Weeks    Status New    Target Date 04/15/20                 Plan - 02/18/20 1422    Clinical Impression Statement Pt demonstrates overall improved R hip comfort level and function. Main challenge at the moment seems to be weakness with hip flexion and hip extension. Improved ability to perform hip flexion in supine with leg straight with pelvis posteriorly tilted to simulate activation of abdominal muscles though difficult. Improved hip extension with activation of transversus abdominis muscles though still very difficult. No R hip pain throughout session. Pt will  benefit from continued skilled physical therapy services to improve strength, function and in preparation for her surgery.    Personal Factors and Comorbidities Comorbidity 1    Comorbidities hx of C-section    Examination-Activity Limitations Squat    Stability/Clinical Decision Making Stable/Uncomplicated    Clinical Decision Making Low    Rehab Potential Good    PT Frequency 1x / week    PT Duration 8 weeks    PT Treatment/Interventions Therapeutic activities;Therapeutic exercise;Neuromuscular re-education;Patient/family education;Manual techniques;Dry needling;Spinal Manipulations;Joint Manipulations;Aquatic Therapy;Electrical Stimulation;Iontophoresis 4mg /ml Dexamethasone;Ultrasound    PT Next Visit Plan isometric, then concentric, then eccentric hip and abdominal strengthening, manual techniques, modalities PRN    PT Home Exercise Plan Medbridge Access Code: [email or web address]    Consulted and Agree with Plan of Care Patient           Patient will benefit from skilled therapeutic intervention in order to improve the following deficits and impairments:  Pain, Improper body mechanics, Postural dysfunction, Decreased strength  Visit Diagnosis: Pain in right hip - Plan: PT plan of care cert/re-cert  Muscle weakness (generalized) - Plan: PT plan of care cert/re-cert     Problem List Patient Active Problem List   Diagnosis Date Noted  . 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 S/P cesarean section - triplets 11/29 02/17/2012  . Postpartum care following cesarean delivery (11/29) 02/17/2012  . Rh negative, maternal 02/17/2012  . 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 Routine general medical examination at a health care facility 06/23/2010  . FIBROCYSTIC BREAST DISEASE 07/17/2008  . Asthma, 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   06/19/2008 PT, DPT    02/18/2020, 2:54 PM  [place] [facility] PHYSICAL AND SPORTS MEDICINE [address] [place], [place],  [id number] Phone: [phone]   Fax:  [phone]  Name: Nicole Owens MRN: [id number] Date of Birth: 04/30/1980

## 2020-02-18 NOTE — Telephone Encounter (Signed)
Received call from Loralyn Freshwater, PT with [facility] requesting call back to discuss patient's rehab. He states that from his standpoint, repair of diastasis recti is medically necessary to help with the patient's right hip pain. He would like a call back to discuss [phone].

## 2020-02-18 NOTE — Progress Notes (Signed)
[facility] [address] [place] - [address] [place] [address] Phone: [phone] Fax: [phone]  [facility] [address] [place] - [address] [place] [address] Phone: [phone] Fax: [phone]      Your procedure is scheduled on Wednesday December 8th.  Report to [facility] Main Entrance "A" at 1225 P.M., and check in at the Admitting office.  Call this number if you have problems the morning of surgery:  [phone]  Call [phone] if you have any questions prior to your surgery date Monday-Friday 8am-4pm    Remember:  Do not eat after midnight the night before your surgery  You may drink clear liquids until 11:25am the morning of your surgery.   Clear liquids allowed are: Water, Non-Citrus Juices (without pulp), Carbonated Beverages, Clear Tea, Black Coffee Only, and Gatorade   Enhanced Recovery after Surgery  Enhanced Recovery after Surgery is a protocol used to improve the stress on your body and your recovery after surgery.  Patient Instructions  . The night before surgery:  o No food after midnight. ONLY clear liquids after midnight  .  Marland Kitchen The day of surgery (if you do NOT have diabetes):  o Drink ONE (1) Pre-Surgery Clear Ensure by _11:25__ am the morning of surgery   o This drink was given to you during your hospital  pre-op appointment visit. o Nothing else to drink after completing the  Pre-Surgery Clear Ensure.     Take these medicines the morning of surgery with A SIP OF WATER  budesonide-formoterol (SYMBICORT) 80-4.5 MCG/ACT inhaler  IF NEEDED:  acetaminophen (TYLENOL) 325 MG tablet  albuterol (VENTOLIN HFA) 108 (90 Base) MCG/ACT inhaler  Please bring your albuterol inhaler with you to the hospital   As of today, STOP taking any Aspirin (unless otherwise instructed by your surgeon) Aleve, Naproxen, Ibuprofen, Motrin, Advil, Goody's, BC's, all herbal  medications, fish oil, and all vitamins.                      Do not wear jewelry, make up, or nail polish            Do not wear lotions, powders, perfumes, or deodorant.            Do not shave 48 hours prior to surgery.              Do not bring valuables to the hospital.            [facility] is not responsible for any belongings or valuables.  Do NOT Smoke (Tobacco/Vaping) or drink Alcohol 24 hours prior to your procedure If you use a CPAP at night, you may bring all equipment for your overnight stay.   Contacts, glasses, dentures or bridgework may not be worn into surgery.      For patients admitted to the hospital, discharge time will be determined by your treatment team.   Patients discharged the day of surgery will not be allowed to drive home, and someone needs to stay with them for 24 hours.    Special instructions:   [place]- Preparing For Surgery  Before surgery, you can play an important role. Because skin is not sterile, your skin needs to be as free of germs as possible. You can reduce the number of germs on your skin by washing with CHG (chlorahexidine gluconate) Soap before surgery.  CHG is an antiseptic cleaner which kills germs and  bonds with the skin to continue killing germs even after washing.    Oral Hygiene is also important to reduce your risk of infection.  Remember - BRUSH YOUR TEETH THE MORNING OF SURGERY WITH YOUR REGULAR TOOTHPASTE  Please do not use if you have an allergy to CHG or antibacterial soaps. If your skin becomes reddened/irritated stop using the CHG.  Do not shave (including legs and underarms) for at least 48 hours prior to first CHG shower. It is OK to shave your face.  Please follow these instructions carefully.   1. Shower the NIGHT BEFORE SURGERY and the MORNING OF SURGERY with CHG Soap.   2. If you chose to wash your hair, wash your hair first as usual with your normal shampoo.  3. After you shampoo, rinse your hair and body  thoroughly to remove the shampoo.  4. Use CHG as you would any other liquid soap. You can apply CHG directly to the skin and wash gently with a scrungie or a clean washcloth.   5. Apply the CHG Soap to your body ONLY FROM THE NECK DOWN.  Do not use on open wounds or open sores. Avoid contact with your eyes, ears, mouth and genitals (private parts). Wash Face and genitals (private parts)  with your normal soap.   6. Wash thoroughly, paying special attention to the area where your surgery will be performed.  7. Thoroughly rinse your body with warm water from the neck down.  8. DO NOT shower/wash with your normal soap after using and rinsing off the CHG Soap.  9. Pat yourself dry with a CLEAN TOWEL.  10. Wear CLEAN PAJAMAS to bed the night before surgery  11. Place CLEAN SHEETS on your bed the night of your first shower and DO NOT SLEEP WITH PETS.   Day of Surgery: Wear Clean/Comfortable clothing the morning of surgery Do not apply any deodorants/lotions.   Remember to brush your teeth WITH YOUR REGULAR TOOTHPASTE.   Please read over the following fact sheets that you were given.

## 2020-02-18 NOTE — Telephone Encounter (Signed)
Call attempt made to [facility]. Left message for return phone call. I also sent a staff message to Mr. Sherre Scarlet with my direct contact number so that he can call me at his convenience to discuss this matter.

## 2020-02-19 ENCOUNTER — Inpatient Hospital Stay (HOSPITAL_COMMUNITY)
Admission: RE | Admit: 2020-02-19 | Discharge: 2020-02-19 | Disposition: A | Discharge status: Home / Self Care | Payer: BC Managed Care – PPO | Source: Ambulatory Visit

## 2020-02-21 ENCOUNTER — Other Ambulatory Visit (HOSPITAL_COMMUNITY): Payer: BC Managed Care – PPO

## 2020-02-23 ENCOUNTER — Ambulatory Visit: Payer: BC Managed Care – PPO

## 2020-02-23 ENCOUNTER — Other Ambulatory Visit: Payer: Self-pay

## 2020-02-23 DIAGNOSIS — M25551 Pain in right hip: Secondary | ICD-10-CM

## 2020-02-23 DIAGNOSIS — M6281 Muscle weakness (generalized): Secondary | ICD-10-CM

## 2020-02-23 NOTE — Therapy (Signed)
[facility]ICINE [address], [place], [address] Phone: [phone]   Fax:  [phone]  Physical Therapy Treatment  Patient Details  Name: Nicole Owens MRN: [id number] Date of Birth: 05-04-1980 Referring Provider (PT): [name], MD   Encounter Date: 02/23/2020   PT End of Session - 02/23/20 1055    Visit Number 51    Number of Visits 59    Date for PT Re-Evaluation 04/15/20    PT Start Time 1034    PT Stop Time 1116    PT Time Calculation (min) 42 min    Activity Tolerance Patient tolerated treatment well    Behavior During Therapy [facility] for tasks assessed/performed           Past Medical History:  Diagnosis Date  . Asthma   . Asthma, intermittent    mild  . Carpal tunnel syndrome on both sides    with pregnancy  . Female infertility 04/30/2010   history restored after error with record merge  . Heartburn in pregnancy    nausea  . 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 Postpartum care following cesarean delivery 02/17/2012  . Rh negative, maternal 02/17/2012  . S/P cesarean section - triplets 11/29 02/17/2012    Past Surgical History:  Procedure Laterality Date  . CESAREAN SECTION  02/16/2012   Procedure: CESAREAN SECTION;  Surgeon: Lenoard Aden, MD;  Location: [facility];  Service: Obstetrics;  Laterality: N/A;  . NO PAST SURGERIES    . WISDOM TOOTH EXTRACTION      There were no vitals filed for this visit.   Subjective Assessment - 02/23/20 1035    Subjective Hernia (mesh and closing the gap) surgery is scheduled for 04/08/2020    Pertinent History R hip pain. Pain began mid November 2020. Pt was at the gym. Pt was performing scissor kicks. Felt a little pain more than normal. Went for a backward lunge with R LE and felt sharp pain R lower abdomen. Took 10 days off and iced that area. Did not really get better. Was diagnosed with sports hernia. Still getting pain but getting a little  better. Could not jog before 6 weeks ago. Thinks she can jog slowly now. Using her R LE to stand up from a R half kneeling position bothers her. Pt goes to the gym 6 days a week before, no goes to the gym 1 time a week and performs modified upper body work out. Pivoting the wrong way sometimes would hurt. Going up the steps 2 at a time bothers her R hip. Getting out of bed involving SLR hip flexion movement would bother it. Coughing, sneezing would bother R hip. Had C section 7 years ago for triplets. The pain is along the line of the incision.    Currently in Pain? No/denies    Pain Score 0-No pain                                     PT Education - 02/23/20 1037    Education Details ther-ex    Person(s) Educated Patient    Methods Explanation;Demonstration;Tactile cues;Verbal cues    Comprehension Returned demonstration;Verbalized understanding             Objectives  No latex allergies     MedbridgeAccess Code: [email or web address]  Pt wants to work up to being able to do planks,  pushups, sit ups, spider and mountain climbers, jumps   Surgery scheduled for 04/08/2020   Therapeutic exercise  Seated R hip flexion 10x3  Slight R hip pain at 3rd set which eased with rest.   Abdomen strapped/supported   Seated hip flexion. Less difficulty   Supine SLR hip flexion. Able to lift up at least 1/2 way without pillow support for posterior pelvic tilt and no PT assist. Pt able to perform independently at least 2x.    hooklying transversus abdominis contraction 10x10 seconds for 2 sets    Hip fallouts 10x2 each    Tendency for L pelvic rotation during L hip fallout. Mod verbal cues needed to correct    hooklying marches/toe taps 10x3 each LE   hooklying heel slides (moving heel not touching table) 10x each LE alternating   Seated trunk rotation isometrics with transversus abdominis contraction with hands behind head10x3with 5 second holds each  direction  and PT manual resistance     Improved exercise technique, movement at target joints, use of target muscles aftermin tomod verbal, visual, tactile cues.    Response to treatment Pt tolerated session well without aggravation of symptoms.   Clinical presentation Improved ability to perform R hip flexion with abdominal muscles supported with straps suggesting increased need of core strength in which chronic  diastasis recti from her pregnancy years ago may pose as an obstacle.  Pt was recommended to get an abdominal support and perform transversus abdominis exercises throughout the day, about an hour at a time as tolerated. Pt verbalized understanding. Pt tolerated session well without aggravation of symptoms. Pt will benefit from continued skilled physical therapy services to decrease pain, improve strength and function.        PT Short Term Goals - 05/01/19 1149      PT SHORT TERM GOAL #1   Title Patient will be independent with her HEP to decrease pain, improve strength and function.    Baseline Pt has started her HEP (03/19/2019); Pt demonstrates independence with her HEP (05/01/2019)    Time 3    Period Weeks    Status Achieved    Target Date 04/10/19             PT Long Term Goals - 02/18/20 1430      PT LONG TERM GOAL #1   Title Pt will have a decrease in R hip pain to 2/10 or less at worst to promote ability to run, perform exercises, stand up from a half kneeling position more comfortably.    Baseline 6/10 R hip pain at most (03/19/2019); 2-3/10 at most for the past 7 days (05/01/2019); 2-3/10 at most for the past 7 days (05/12/19), (05/26/2019); 3/10 (07/08/2019); 4/10 at most for the past 7 days (after workout at her gym and softball)  2-3/10 at most for the past 7 days. (11/04/2019); 4/10  R hip pain at most for the past 7 days (12/31/2019); 2/10 at most for the past 7 days (02/18/2020)    Time 6    Period Weeks    Status Achieved     Target Date 02/12/20      PT LONG TERM GOAL #2   Title Patient will improve R hip flexion, extension, abduction, and adduction strength by at least 1/2 MMT grade to promote ability to perform functional tasks more comfortably.    Baseline improved by 1/2 MMT grade but pain with hip adduction (05/26/2019); maintained except hip extension (07/08/2019); impmroved strength except hip extension (11/04/2019); improved  strength at all target planes (12/31/2019)    Time 8    Period Weeks    Status Achieved      PT LONG TERM GOAL #3   Title Pt will be able to jog for at least 5 minutes without R hip pain to promote ability to participate in exercise and promote physical health    Baseline increased R hip pain when jogging or running (03/19/2019); able to perform mini jog with short stride length (05/01/2019), (05/26/2019); Able to jog about 5 min except for this past Monday (07/08/2019); Pt states being able to jog, but does not know if it is painless (09/01/2019); able to jog at treadmil speed 4.5 normal stride length, no pain (11/04/2019)    Time 6    Period Weeks    Status Achieved      PT LONG TERM GOAL #4   Title Pt will be able to perform R SLR hip flexion 5x independently as a demonstration of improved strength.    Baseline Unable to perform one full repetition without posterior pelvic tilt assist from pillow. (12/1/201)    Time 8    Period Weeks    Status New    Target Date 04/15/20                 Plan - 02/23/20 1104    Clinical Impression Statement Improved ability to perform R hip flexion with abdominal muscles supported with straps suggesting increased need of core strength in which chronic  diastasis recti from her pregnancy years ago may pose as an obstacle.  Pt was recommended to get an abdominal support and perform transversus abdominis exercises throughout the day, about an hour at a time as tolerated. Pt verbalized understanding. Pt tolerated session well without aggravation of  symptoms. Pt will benefit from continued skilled physical therapy services to decrease pain, improve strength and function.    Personal Factors and Comorbidities Comorbidity 1    Comorbidities hx of C-section    Examination-Activity Limitations Squat    Stability/Clinical Decision Making Stable/Uncomplicated    Rehab Potential Good    PT Frequency 1x / week    PT Duration 8 weeks    PT Treatment/Interventions Therapeutic activities;Therapeutic exercise;Neuromuscular re-education;Patient/family education;Manual techniques;Dry needling;Spinal Manipulations;Joint Manipulations;Aquatic Therapy;Electrical Stimulation;Iontophoresis 4mg /ml Dexamethasone;Ultrasound    PT Next Visit Plan isometric, then concentric, then eccentric hip and abdominal strengthening, manual techniques, modalities PRN    PT Home Exercise Plan Medbridge Access Code: [email or web address]    Consulted and Agree with Plan of Care Patient           Patient will benefit from skilled therapeutic intervention in order to improve the following deficits and impairments:  Pain, Improper body mechanics, Postural dysfunction, Decreased strength  Visit Diagnosis: Pain in right hip  Muscle weakness (generalized)     Problem List Patient Active Problem List   Diagnosis Date Noted  . 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 S/P cesarean section - triplets 11/29 02/17/2012  . Postpartum care following cesarean delivery (11/29) 02/17/2012  . Rh negative, maternal 02/17/2012  . 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 Routine general medical examination at a health care facility 06/23/2010  . FIBROCYSTIC BREAST DISEASE 07/17/2008  . Asthma, 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    06/19/2008 PT, DPT   02/23/2020, 11:38 AM  [facility] PHYSICAL AND SPORTS MEDICINE [address], [address], [place] Phone: [phone]   Fax:  [phone]  Name: Nicole Owens MRN: Darrol Poke Date  of Birth:  08/26/1980

## 2020-02-23 NOTE — Patient Instructions (Addendum)
Abdomen strapped/supported      hooklying transversus abdominis contraction 10x10 seconds for at least 3 sets of 10 daily    As well as hip fallouts   Pt demonstrated and verbalized understanding.

## 2020-02-25 ENCOUNTER — Ambulatory Visit (HOSPITAL_COMMUNITY): Admission: RE | Admit: 2020-02-25 | Payer: BC Managed Care – PPO | Source: Home / Self Care | Admitting: General Surgery

## 2020-02-25 ENCOUNTER — Encounter (HOSPITAL_COMMUNITY): Admission: RE | Payer: Self-pay | Source: Home / Self Care

## 2020-02-25 SURGERY — REPAIR, HERNIA, INGUINAL, ROBOT-ASSISTED, LAPAROSCOPIC, USING MESH
Anesthesia: General | Laterality: Right

## 2020-03-03 ENCOUNTER — Other Ambulatory Visit: Payer: Self-pay

## 2020-03-03 ENCOUNTER — Ambulatory Visit: Payer: BC Managed Care – PPO

## 2020-03-03 DIAGNOSIS — M25551 Pain in right hip: Secondary | ICD-10-CM | POA: Diagnosis not present

## 2020-03-03 DIAGNOSIS — M6281 Muscle weakness (generalized): Secondary | ICD-10-CM | POA: Diagnosis not present

## 2020-03-03 NOTE — Therapy (Signed)
[facility]TS [facility]ICINE [address], [place], [address] Phone: [phone]   Fax:  [phone]  Physical Therapy Treatment  Patient Details  Name: Nicole Owens MRN: [id number] Date of Birth: Jun 03, 1980 Referring Provider (PT): [name], MD   Encounter Date: 03/03/2020   PT End of Session - 03/03/20 0936    Visit Number 52    Number of Visits 59    Date for PT Re-Evaluation 04/15/20    PT Start Time 0936    PT Stop Time 1016    PT Time Calculation (min) 40 min    Activity Tolerance Patient tolerated treatment well    Behavior During Therapy [facility] for tasks assessed/performed           Past Medical History:  Diagnosis Date   Asthma    Asthma, intermittent    mild   Carpal tunnel syndrome on both sides    with pregnancy   Female infertility 04/30/2010   history restored after error with record merge   Heartburn in pregnancy    nausea   Multiple gestation - triplets 08/10/2011   Postpartum care following cesarean delivery 02/17/2012   Rh negative, maternal 02/17/2012   S/P cesarean section - triplets 11/29 02/17/2012    Past Surgical History:  Procedure Laterality Date   CESAREAN SECTION  02/16/2012   Procedure: CESAREAN SECTION;  Surgeon: Lenoard Aden, MD;  Location: [facility];  Service: Obstetrics;  Laterality: N/A;   NO PAST SURGERIES     WISDOM TOOTH EXTRACTION      There were no vitals filed for this visit.   Subjective Assessment - 03/03/20 0937    Subjective Doing good. Very little R hip pain, just random spurts. No pain currently.    Pertinent History R hip pain. Pain began mid November 2020. Pt was at the gym. Pt was performing scissor kicks. Felt a little pain more than normal. Went for a backward lunge with R LE and felt sharp pain R lower abdomen. Took 10 days off and iced that area. Did not really get better. Was diagnosed with sports hernia. Still getting pain but getting a  little better. Could not jog before 6 weeks ago. Thinks she can jog slowly now. Using her R LE to stand up from a R half kneeling position bothers her. Pt goes to the gym 6 days a week before, no goes to the gym 1 time a week and performs modified upper body work out. Pivoting the wrong way sometimes would hurt. Going up the steps 2 at a time bothers her R hip. Getting out of bed involving SLR hip flexion movement would bother it. Coughing, sneezing would bother R hip. Had C section 7 years ago for triplets. The pain is along the line of the incision.    Currently in Pain? No/denies                                     PT Education - 03/03/20 0955    Education Details ther-ex    Person(s) Educated Patient    Methods Explanation;Demonstration;Tactile cues;Verbal cues    Comprehension Returned demonstration;Verbalized understanding          Objectives  No latex allergies     MedbridgeAccess Code: [email or web address]  Pt wants to work up to being able to do planks, pushups, sit ups, spider and mountain climbers, jumps  Surgery scheduled for 04/08/2020   Therapeutic exercise  Abdomen strapped/supported     Seated trunk rotationisometricswith transversus abdominis contraction with hands behind head10x3with 5 second holds each direction  and PT manual resistance              hooklying transversus abdominis contraction with              Hip fallouts 10x2 each                                      hooklying marches/toe taps 10x3 each LE   hooklying hip adduction ball and glute max squeeze 10x5 seconds for 3 sets               hooklying heel slides (moving heel not touching table) 10x each LE alternating. PT assist to return.               Supine SLR hip flexion 1/2 way with PT light assist 3x    Improved exercise technique, movement at target joints, use of target muscles aftermin tomod verbal, visual, tactile  cues.    Response to treatment Pt tolerated session well without aggravation of symptoms.   Clinical presentation Continued working on transversus abdominis strengthening with abdominal strap to approximate her rectus abdominis muscles to promote core strength for good foundation for hip muscles to work from. Pt tolerated session well without aggravation of symptoms. Pt will benefit from continued skilled physical therapy services to improve strength and function.       PT Short Term Goals - 05/01/19 1149      PT SHORT TERM GOAL #1   Title Patient will be independent with her HEP to decrease pain, improve strength and function.    Baseline Pt has started her HEP (03/19/2019); Pt demonstrates independence with her HEP (05/01/2019)    Time 3    Period Weeks    Status Achieved    Target Date 04/10/19             PT Long Term Goals - 02/18/20 1430      PT LONG TERM GOAL #1   Title Pt will have a decrease in R hip pain to 2/10 or less at worst to promote ability to run, perform exercises, stand up from a half kneeling position more comfortably.    Baseline 6/10 R hip pain at most (03/19/2019); 2-3/10 at most for the past 7 days (05/01/2019); 2-3/10 at most for the past 7 days (05/12/19), (05/26/2019); 3/10 (07/08/2019); 4/10 at most for the past 7 days (after workout at her gym and softball)  2-3/10 at most for the past 7 days. (11/04/2019); 4/10  R hip pain at most for the past 7 days (12/31/2019); 2/10 at most for the past 7 days (02/18/2020)    Time 6    Period Weeks    Status Achieved    Target Date 02/12/20      PT LONG TERM GOAL #2   Title Patient will improve R hip flexion, extension, abduction, and adduction strength by at least 1/2 MMT grade to promote ability to perform functional tasks more comfortably.    Baseline improved by 1/2 MMT grade but pain with hip adduction (05/26/2019); maintained except hip extension (07/08/2019); impmroved strength except hip extension  (11/04/2019); improved strength at all target planes (12/31/2019)    Time 8    Period Weeks    Status  Achieved      PT LONG TERM GOAL #3   Title Pt will be able to jog for at least 5 minutes without R hip pain to promote ability to participate in exercise and promote physical health    Baseline increased R hip pain when jogging or running (03/19/2019); able to perform mini jog with short stride length (05/01/2019), (05/26/2019); Able to jog about 5 min except for this past Monday (07/08/2019); Pt states being able to jog, but does not know if it is painless (09/01/2019); able to jog at treadmil speed 4.5 normal stride length, no pain (11/04/2019)    Time 6    Period Weeks    Status Achieved      PT LONG TERM GOAL #4   Title Pt will be able to perform R SLR hip flexion 5x independently as a demonstration of improved strength.    Baseline Unable to perform one full repetition without posterior pelvic tilt assist from pillow. (12/1/201)    Time 8    Period Weeks    Status New    Target Date 04/15/20                 Plan - 03/03/20 0955    Clinical Impression Statement Continued working on transversus abdominis strengthening with abdominal strap to approximate her rectus abdominis muscles to promote core strength for good foundation for hip muscles to work from. Pt tolerated session well without aggravation of symptoms. Pt will benefit from continued skilled physical therapy services to improve strength and function.    Personal Factors and Comorbidities Comorbidity 1    Comorbidities hx of C-section    Examination-Activity Limitations Squat    Stability/Clinical Decision Making Stable/Uncomplicated    Rehab Potential Good    PT Frequency 1x / week    PT Duration 8 weeks    PT Treatment/Interventions Therapeutic activities;Therapeutic exercise;Neuromuscular re-education;Patient/family education;Manual techniques;Dry needling;Spinal Manipulations;Joint Manipulations;Aquatic  Therapy;Electrical Stimulation;Iontophoresis 4mg /ml Dexamethasone;Ultrasound    PT Next Visit Plan isometric, then concentric, then eccentric hip and abdominal strengthening, manual techniques, modalities PRN    PT Home Exercise Plan Medbridge Access Code: [email or web address]    Consulted and Agree with Plan of Care Patient           Patient will benefit from skilled therapeutic intervention in order to improve the following deficits and impairments:  Pain,Improper body mechanics,Postural dysfunction,Decreased strength  Visit Diagnosis: Pain in right hip  Muscle weakness (generalized)     Problem List Patient Active Problem List   Diagnosis Date Noted   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   S/P cesarean section - triplets 11/29 02/17/2012   Postpartum care following cesarean delivery (11/29) 02/17/2012   Rh negative, maternal 02/17/2012   Multiple gestation - triplets 08/10/2011   Routine general medical examination at a health care facility 06/23/2010   FIBROCYSTIC BREAST DISEASE 07/17/2008   Asthma, 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    06/19/2008 PT, DPT   03/03/2020, 10:27 AM  [place]  [facility]TS MEDICINE [address], [place] Phone: [phone]   Fax:  [phone]  Name: Nicole Owens MRN: Darrol Poke Date of Birth: [birth year]

## 2020-03-08 ENCOUNTER — Ambulatory Visit: Payer: BC Managed Care – PPO

## 2020-03-08 ENCOUNTER — Other Ambulatory Visit: Payer: Self-pay

## 2020-03-08 DIAGNOSIS — M6281 Muscle weakness (generalized): Secondary | ICD-10-CM | POA: Diagnosis not present

## 2020-03-08 DIAGNOSIS — M25551 Pain in right hip: Secondary | ICD-10-CM

## 2020-03-08 NOTE — Therapy (Signed)
[place] [facility] [address] [place], [address] Phone: [phone]   Fax:  [phone]  Physical Therapy Treatment  Patient Details  Name: Nicole Owens MRN: [id number] Date of Birth: 29-Apr-1980 Referring Provider (PT): [name], MD   Encounter Date: 03/08/2020   PT End of Session - 03/08/20 1119    Visit Number 53    Number of Visits 59    Date for PT Re-Evaluation 04/15/20    PT Start Time 1119    PT Stop Time 1200    PT Time Calculation (min) 41 min    Activity Tolerance Patient tolerated treatment well    Behavior During Therapy [facility] for tasks assessed/performed           Past Medical History:  Diagnosis Date  . Asthma   . Asthma, intermittent    mild  . Carpal tunnel syndrome on both sides    with pregnancy  . Female infertility 04/30/2010   history restored after error with record merge  . Heartburn in pregnancy    nausea  . 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 Postpartum care following cesarean delivery 02/17/2012  . Rh negative, maternal 02/17/2012  . S/P cesarean section - triplets 11/29 02/17/2012    Past Surgical History:  Procedure Laterality Date  . CESAREAN SECTION  02/16/2012   Procedure: CESAREAN SECTION;  Surgeon: Lenoard Aden, MD;  Location: [facility];  Service: Obstetrics;  Laterality: N/A;  . NO PAST SURGERIES    . WISDOM TOOTH EXTRACTION      There were no vitals filed for this visit.   Subjective Assessment - 03/08/20 1121    Subjective R hip feels pretty good, no pain currently. Hip flexion is getting easier, big steps are still tough and it hurts.    Pertinent History R hip pain. Pain began mid November 2020. Pt was at the gym. Pt was performing scissor kicks. Felt a little pain more than normal. Went for a backward lunge with R LE and felt sharp pain R lower abdomen. Took 10 days off and iced that area. Did not really get better. Was diagnosed with sports  hernia. Still getting pain but getting a little better. Could not jog before 6 weeks ago. Thinks she can jog slowly now. Using her R LE to stand up from a R half kneeling position bothers her. Pt goes to the gym 6 days a week before, no goes to the gym 1 time a week and performs modified upper body work out. Pivoting the wrong way sometimes would hurt. Going up the steps 2 at a time bothers her R hip. Getting out of bed involving SLR hip flexion movement would bother it. Coughing, sneezing would bother R hip. Had C section 7 years ago for triplets. The pain is along the line of the incision.    Currently in Pain? No/denies                                     PT Education - 03/08/20 1123    Education Details ther-ex    Person(s) Educated Patient    Methods Explanation;Demonstration;Tactile cues;Verbal cues    Comprehension Verbalized understanding;Returned demonstration           Objectives  No latex allergies     MedbridgeAccess Code: [email or web address]  Pt wants to work up to being able to do  planks, pushups, sit ups, spider and mountain climbers, jumps   Surgery scheduled for 04/08/2020   Therapeutic exercise  Abdomen strapped/supported               Seated trunk rotationisometricswith transversus abdominis contraction with hands behind head10x3with 5 second holdseach direction  and PT manual resistances  hooklying transversus abdominis contraction with             Hip fallouts 10x2 each   hooklying marches/toe taps 10x3 each LE              hooklying hip adduction ball and glute max squeeze 10x5 seconds for 3 sets    hooklying manually resisted pallof press   R 10x5 seconds for 3 sets   L 10x5 seconds for 3 sets   hooklying alternating leg extension comfortable range 5x each LE    Improved exercise technique, movement at target joints, use of target  muscles aftermin tomod verbal, visual, tactile cues.    Response to treatment Pt tolerated session well without aggravation of symptoms.   Clinical presentation Continued working on core strengthening (not rectus abdominis) with abdominal strap secondary to diastasis recti to promote ability to perform hip flexion and extension. Good muscle use felt during exercises, no pain. Pt will benefit from continued skilled physical therapy services to improve strength, decrease pain, improve function.      PT Short Term Goals - 05/01/19 1149      PT SHORT TERM GOAL #1   Title Patient will be independent with her HEP to decrease pain, improve strength and function.    Baseline Pt has started her HEP (03/19/2019); Pt demonstrates independence with her HEP (05/01/2019)    Time 3    Period Weeks    Status Achieved    Target Date 04/10/19             PT Long Term Goals - 02/18/20 1430      PT LONG TERM GOAL #1   Title Pt will have a decrease in R hip pain to 2/10 or less at worst to promote ability to run, perform exercises, stand up from a half kneeling position more comfortably.    Baseline 6/10 R hip pain at most (03/19/2019); 2-3/10 at most for the past 7 days (05/01/2019); 2-3/10 at most for the past 7 days (05/12/19), (05/26/2019); 3/10 (07/08/2019); 4/10 at most for the past 7 days (after workout at her gym and softball)  2-3/10 at most for the past 7 days. (11/04/2019); 4/10  R hip pain at most for the past 7 days (12/31/2019); 2/10 at most for the past 7 days (02/18/2020)    Time 6    Period Weeks    Status Achieved    Target Date 02/12/20      PT LONG TERM GOAL #2   Title Patient will improve R hip flexion, extension, abduction, and adduction strength by at least 1/2 MMT grade to promote ability to perform functional tasks more comfortably.    Baseline improved by 1/2 MMT grade but pain with hip adduction (05/26/2019); maintained except hip extension (07/08/2019); impmroved  strength except hip extension (11/04/2019); improved strength at all target planes (12/31/2019)    Time 8    Period Weeks    Status Achieved      PT LONG TERM GOAL #3   Title Pt will be able to jog for at least 5 minutes without R hip pain to promote ability to participate in exercise and promote physical health  Baseline increased R hip pain when jogging or running (03/19/2019); able to perform mini jog with short stride length (05/01/2019), (05/26/2019); Able to jog about 5 min except for this past Monday (07/08/2019); Pt states being able to jog, but does not know if it is painless (09/01/2019); able to jog at treadmil speed 4.5 normal stride length, no pain (11/04/2019)    Time 6    Period Weeks    Status Achieved      PT LONG TERM GOAL #4   Title Pt will be able to perform R SLR hip flexion 5x independently as a demonstration of improved strength.    Baseline Unable to perform one full repetition without posterior pelvic tilt assist from pillow. (12/1/201)    Time 8    Period Weeks    Status New    Target Date 04/15/20                 Plan - 03/08/20 1128    Clinical Impression Statement Continued working on core strengthening (not rectus abdominis) with abdominal strap secondary to diastasis recti to promote ability to perform hip flexion and extension. Good muscle use felt during exercises, no pain. Pt will benefit from continued skilled physical therapy services to improve strength, decrease pain, improve function.    Personal Factors and Comorbidities Comorbidity 1    Comorbidities hx of C-section    Examination-Activity Limitations Squat    Stability/Clinical Decision Making Stable/Uncomplicated    Rehab Potential Good    PT Frequency 1x / week    PT Duration 8 weeks    PT Treatment/Interventions Therapeutic activities;Therapeutic exercise;Neuromuscular re-education;Patient/family education;Manual techniques;Dry needling;Spinal Manipulations;Joint Manipulations;Aquatic  Therapy;Electrical Stimulation;Iontophoresis 4mg /ml Dexamethasone;Ultrasound    PT Next Visit Plan isometric, then concentric, then eccentric hip and abdominal strengthening, manual techniques, modalities PRN    PT Home Exercise Plan Medbridge Access Code: [email or web address]    Consulted and Agree with Plan of Care Patient           Patient will benefit from skilled therapeutic intervention in order to improve the following deficits and impairments:  Pain,Improper body mechanics,Postural dysfunction,Decreased strength  Visit Diagnosis: Pain in right hip  Muscle weakness (generalized)     Problem List Patient Active Problem List   Diagnosis Date Noted  . 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 S/P cesarean section - triplets 11/29 02/17/2012  . Postpartum care following cesarean delivery (11/29) 02/17/2012  . Rh negative, maternal 02/17/2012  . 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 Routine general medical examination at a health care facility 06/23/2010  . FIBROCYSTIC BREAST DISEASE 07/17/2008  . Asthma, 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    06/19/2008 PT, DPT   03/08/2020, 12:20 PM  [facility] SPORTS MEDICINE [address], [address], [place] Phone: [phone]   Fax:  [phone]  Name: Nicole Owens MRN: Darrol Poke Date of Birth: 1980/12/26

## 2020-03-16 ENCOUNTER — Ambulatory Visit: Payer: BC Managed Care – PPO

## 2020-03-16 ENCOUNTER — Other Ambulatory Visit: Payer: Self-pay

## 2020-03-16 DIAGNOSIS — M6281 Muscle weakness (generalized): Secondary | ICD-10-CM | POA: Diagnosis not present

## 2020-03-16 DIAGNOSIS — M25551 Pain in right hip: Secondary | ICD-10-CM

## 2020-03-16 NOTE — Therapy (Signed)
[place] [facility] [address] [place], [address] Phone: [phone]   Fax:  [phone]  Physical Therapy Treatment  Patient Details  Name: Nicole Owens MRN: [id number] Date of Birth: [birth year] Referring Provider (PT): [name], MD   Encounter Date: 03/16/2020   PT End of Session - 03/16/20 1033    Visit Number 54    Number of Visits 59    Date for PT Re-Evaluation 04/15/20    PT Start Time 1034    PT Stop Time 1114    PT Time Calculation (min) 40 min    Activity Tolerance Patient tolerated treatment well    Behavior During Therapy [facility] for tasks assessed/performed           Past Medical History:  Diagnosis Date   Asthma    Asthma, intermittent    mild   Carpal tunnel syndrome on both sides    with pregnancy   Female infertility 04/30/2010   history restored after error with record merge   Heartburn in pregnancy    nausea   Multiple gestation - triplets 08/10/2011   Postpartum care following cesarean delivery 02/17/2012   Rh negative, maternal 02/17/2012   S/P cesarean section - triplets 11/29 02/17/2012    Past Surgical History:  Procedure Laterality Date   CESAREAN SECTION  02/16/2012   Procedure: CESAREAN SECTION;  Surgeon: Lenoard Aden, MD;  Location: [facility];  Service: Obstetrics;  Laterality: N/A;   NO PAST SURGERIES     WISDOM TOOTH EXTRACTION      There were no vitals filed for this visit.   Subjective Assessment - 03/16/20 1035    Subjective Wearing her abdominial brace/strap today. [place]aiser to do her gym routine.    Pertinent History R hip pain. Pain began mid November 2020. Pt was at the gym. Pt was performing scissor kicks. Felt a little pain more than normal. Went for a backward lunge with R LE and felt sharp pain R lower abdomen. Took 10 days off and iced that area. Did not really get better. Was diagnosed with sports hernia. Still getting pain but getting a  little better. Could not jog before 6 weeks ago. Thinks she can jog slowly now. Using her R LE to stand up from a R half kneeling position bothers her. Pt goes to the gym 6 days a week before, no goes to the gym 1 time a week and performs modified upper body work out. Pivoting the wrong way sometimes would hurt. Going up the steps 2 at a time bothers her R hip. Getting out of bed involving SLR hip flexion movement would bother it. Coughing, sneezing would bother R hip. Had C section 7 years ago for triplets. The pain is along the line of the incision.    Currently in Pain? No/denies                                     PT Education - 03/16/20 1037    Education Details ther-ex    Person(s) Educated Patient    Methods Explanation;Demonstration;Tactile cues;Verbal cues    Comprehension Returned demonstration;Verbalized understanding          Objectives  No latex allergies     MedbridgeAccess Code: [email or web address]  Pt wants to work up to being able to do planks, pushups, sit ups, spider and mountain climbers, jumps  Surgery scheduled for 04/08/2020   Therapeutic exercise  Abdomen strapped/supported    seated hip flexion   R 10x2     Seated R knee flexion   Yellow band 10x2  Seated trunk rotationisometricswith transversus abdominis contraction with hands behind head  R 10x4with 5 second holds. Improved lumbar posture  L 10x2 with 5 second holds   and PT manual resistances  hooklying transversus abdominis contractionwith Hip fallouts 10x each   hooklying hip adduction ball and glute max squeeze 10x10 seconds for 2 sets  hooklying marches/toe taps 10x3 each LE               hooklying manually resisted pallof press                         R 10x5 seconds for 3 sets                         L 10x5 seconds for 3 sets                 hooklying leg extension comfortable range 5x   Then to end range 5x2. Difficult.     Bridge, single leg R LE 5x2      Improved exercise technique, movement at target joints, use of target muscles aftermin tomod verbal, visual, tactile cues.    Response to treatment Pt tolerated session well without aggravation of symptoms.   Clinical presentation  Continued working in hip flexion, hamstrings and core strengthening to promote ability to raise her R thigh with less difficulty. Pt tolerated session well without aggravation of symptoms. Pt will benefit from continued skilled physical therapy services to improve strength and function in preparation for surgery.       PT Short Term Goals - 05/01/19 1149      PT SHORT TERM GOAL #1   Title Patient will be independent with her HEP to decrease pain, improve strength and function.    Baseline Pt has started her HEP (03/19/2019); Pt demonstrates independence with her HEP (05/01/2019)    Time 3    Period Weeks    Status Achieved    Target Date 04/10/19             PT Long Term Goals - 02/18/20 1430      PT LONG TERM GOAL #1   Title Pt will have a decrease in R hip pain to 2/10 or less at worst to promote ability to run, perform exercises, stand up from a half kneeling position more comfortably.    Baseline 6/10 R hip pain at most (03/19/2019); 2-3/10 at most for the past 7 days (05/01/2019); 2-3/10 at most for the past 7 days (05/12/19), (05/26/2019); 3/10 (07/08/2019); 4/10 at most for the past 7 days (after workout at her gym and softball)  2-3/10 at most for the past 7 days. (11/04/2019); 4/10  R hip pain at most for the past 7 days (12/31/2019); 2/10 at most for the past 7 days (02/18/2020)    Time 6    Period Weeks    Status Achieved    Target Date 02/12/20      PT LONG TERM GOAL #2   Title Patient will improve R hip flexion, extension, abduction, and adduction strength by at least 1/2 MMT grade to promote  ability to perform functional tasks more comfortably.    Baseline improved by 1/2 MMT grade but pain with hip adduction (05/26/2019);  maintained except hip extension (07/08/2019); impmroved strength except hip extension (11/04/2019); improved strength at all target planes (12/31/2019)    Time 8    Period Weeks    Status Achieved      PT LONG TERM GOAL #3   Title Pt will be able to jog for at least 5 minutes without R hip pain to promote ability to participate in exercise and promote physical health    Baseline increased R hip pain when jogging or running (03/19/2019); able to perform mini jog with short stride length (05/01/2019), (05/26/2019); Able to jog about 5 min except for this past Monday (07/08/2019); Pt states being able to jog, but does not know if it is painless (09/01/2019); able to jog at treadmil speed 4.5 normal stride length, no pain (11/04/2019)    Time 6    Period Weeks    Status Achieved      PT LONG TERM GOAL #4   Title Pt will be able to perform R SLR hip flexion 5x independently as a demonstration of improved strength.    Baseline Unable to perform one full repetition without posterior pelvic tilt assist from pillow. (12/1/201)    Time 8    Period Weeks    Status New    Target Date 04/15/20                 Plan - 03/16/20 1033    Clinical Impression Statement Continued working in hip flexion, hamstrings and core strengthening to promote ability to raise her R thigh with less difficulty. Pt tolerated session well without aggravation of symptoms. Pt will benefit from continued skilled physical therapy services to improve strength and function in preparation for surgery.    Personal Factors and Comorbidities Comorbidity 1    Comorbidities hx of C-section    Examination-Activity Limitations Squat    Stability/Clinical Decision Making Stable/Uncomplicated    Rehab Potential Good    PT Frequency 1x / week    PT Duration 8 weeks    PT Treatment/Interventions Therapeutic  activities;Therapeutic exercise;Neuromuscular re-education;Patient/family education;Manual techniques;Dry needling;Spinal Manipulations;Joint Manipulations;Aquatic Therapy;Electrical Stimulation;Iontophoresis 4mg /ml Dexamethasone;Ultrasound    PT Next Visit Plan isometric, then concentric, then eccentric hip and abdominal strengthening, manual techniques, modalities PRN    PT Home Exercise Plan Medbridge Access Code: [email or web address]    Consulted and Agree with Plan of Care Patient           Patient will benefit from skilled therapeutic intervention in order to improve the following deficits and impairments:  Pain,Improper body mechanics,Postural dysfunction,Decreased strength  Visit Diagnosis: Pain in right hip  Muscle weakness (generalized)     Problem List Patient Active Problem List   Diagnosis Date Noted   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   S/P cesarean section - triplets 11/29 02/17/2012   Postpartum care following cesarean delivery (11/29) 02/17/2012   Rh negative, maternal 02/17/2012   Multiple gestation - triplets 08/10/2011   Routine general medical examination at a health care facility 06/23/2010   FIBROCYSTIC BREAST DISEASE 07/17/2008   Asthma, 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    06/19/2008 PT, DPT   03/16/2020, 12:31 PM  [place] [facility] [address], [place] Phone: [phone]   Fax:  [phone]  Name: Nicole Owens MRN: Darrol Poke Date of Birth: 05-10-1980

## 2020-03-25 ENCOUNTER — Encounter: Payer: Self-pay | Admitting: Surgical

## 2020-03-25 ENCOUNTER — Ambulatory Visit (INDEPENDENT_AMBULATORY_CARE_PROVIDER_SITE_OTHER): Payer: Managed Care, Other (non HMO) | Admitting: Surgical

## 2020-03-25 ENCOUNTER — Other Ambulatory Visit: Payer: Self-pay

## 2020-03-25 DIAGNOSIS — M6208 Separation of muscle (nontraumatic), other site: Secondary | ICD-10-CM

## 2020-03-25 NOTE — Progress Notes (Signed)
Patient ID: Nicole Owens, female    DOB: 1980/04/29, 40 y.o.   MRN: [id number]  Chief Complaint  Patient presents with  . Pre-op Exam      ICD-10-CM   1. Diastasis of rectus abdominis  M62.08      History of Present Illness: Nicole Owens is a 40 y.o.  female  with a history of diastases rectus of abdominis, here for virtual preoperative evaluation prior to abdominoplasty.  She presents for virtual preoperative evaluation for upcoming procedure, abdominoplasty, scheduled for 04/08/2020 with Dr. Ulice Bold  Patient had to have virtual preoperative evaluation as she was exposed to COVID due to her son testing positive.  Patient has no history of anesthesia.  No history of DVT/PE.  No family history of DVT/PE.  No family or personal history of bleeding or clotting disorders.  Patient is not currently taking any blood thinners.  No history of CVA/MI.   Summary of Previous Visit: Patient is 5 feet 2 inches tall and weighs 157 pounds.  She does not have diabetes.  She is not a smoker.  She is not on blood thinners.  She has a history of a C-section in 2013, she had triplets.  She has had extreme rectus diastases, she also has a large hernia the diastases and abdominal loss of domain is remarkable.  Job: Chief Financial Officer for [employer]  PMH Significant for: Asthma, reports no recent exacerbations.  Last hospitalization was greater than 2 years ago.  The patient gave consent to have this visit done by telemedicine / virtual visit.  This is also consent for access the chart and treat the patient via this visit. The patient is located at home.  I, the provider, am at the office.  We spent 35 minutes together for the visit.  Joined by telephone.  Past Medical History: Allergies: Allergies  Allergen Reactions  . Aspirin Shortness Of Breath and Other (See Comments)    Can trigger asthma, told nurse ibuprofen makes her feel odd and only takes one 200mg  tablet if needed    Current  Medications:  Current Outpatient Medications:  .  acetaminophen (TYLENOL) 325 MG tablet, Take 650 mg by mouth every 6 (six) hours as needed for moderate pain or headache., Disp: , Rfl:  .  albuterol (VENTOLIN HFA) 108 (90 Base) MCG/ACT inhaler, INHALE TWO PUFFS BY MOUTH THREE TIMES DAILY AS NEEDED FOR WHEEZING OR  SHORTNESS  OF  BREATH (Patient taking differently: Inhale 2 puffs into the lungs 3 (three) times daily as needed for wheezing or shortness of breath. ), Disp: 18 g, Rfl: 3 .  budesonide-formoterol (SYMBICORT) 80-4.5 MCG/ACT inhaler, Inhale 2 puffs into the lungs 2 (two) times daily. (Patient taking differently: Inhale 1 puff into the lungs daily. ), Disp: 30.6 g, Rfl: 3  Past Medical Problems: Past Medical History:  Diagnosis Date  . Asthma   . Asthma, intermittent    mild  . Carpal tunnel syndrome on both sides    with pregnancy  . Female infertility 04/30/2010   history restored after error with record merge  . Heartburn in pregnancy    nausea  . 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 Postpartum care following cesarean delivery 02/17/2012  . Rh negative, maternal 02/17/2012  . S/P cesarean section - triplets 11/29 02/17/2012    Past Surgical History: Past Surgical History:  Procedure Laterality Date  . CESAREAN SECTION  02/16/2012   Procedure: CESAREAN SECTION;  Surgeon: 02/18/2012, MD;  Location:  [facility];  Service: Obstetrics;  Laterality: N/A;  . NO PAST SURGERIES    . WISDOM TOOTH EXTRACTION      Social History: Social History   Socioeconomic History  . Marital status: Married    Spouse name: Rexine Gowens  . Number of children: 0  . Years of education: Not on file  . Highest education level: Not on file  Occupational History  . Occupation: Agricultural engineer: [employer]  Tobacco Use  . Smoking status: Never Smoker  . Smokeless tobacco: Never Used  Substance and Sexual Activity  . Alcohol use: Yes    Comment: social but none while  pregnant  . Drug use: No  . Sexual activity: Yes    Birth control/protection: None    Comment: pregnant  Other Topics Concern  . Not on file  Social History Narrative   ** Merged History Encounter **       Social Determinants of Health   Financial Resource Strain: Not on file  Food Insecurity: Not on file  Transportation Needs: Not on file  Physical Activity: Not on file  Stress: Not on file  Social Connections: Not on file  Intimate Partner Violence: Not on file    Family History: Family History  Problem Relation Age of Onset  . Hypertension Father   . Obesity Sister     Review of Systems: Review of Systems  Constitutional: Negative.   Respiratory: Negative.   Cardiovascular: Negative.   Gastrointestinal: Negative.   Genitourinary: Negative.   Neurological: Negative.     Physical Exam: Vital Signs There were no vitals taken for this visit. No physical exam was completed as patient's appointment was virtual due to [place] exposure.  Assessment/Plan: The patient is scheduled for abdominoplasty with Dr. Marla Roe.  Risks, benefits, and alternatives of procedure discussed, questions answered and consent obtained.    Smoking Status: Non-smoker; Counseling Given?  N/A Last Mammogram: N/A; Results: N/A  Caprini Score: 4, moderate; Risk Factors include: BMI greater than 25, varicose veins and length of planned surgery. Recommendation for mechanical prophylaxis during surgery, pharmacological prophylaxis while hospitalized.  Encourage early ambulation.   Pictures obtained: At consult  Post-op Rx sent to pharmacy: No prescription sent to pharmacy at this time, patient was exposed to [place], surgical intervention may need to be postponed if patient test positive.  I discussed with the patient that I would send the prescriptions after surgical date has been confirmed.   Patient and I virtually covered the general surgical risk consent document and pain medication agreement  during their appointment.  Due to patient being unable to come in for her preop, her appointment was virtual and we will have her pick up the consent form prior to surgery, read through it and sign.  Any questions can be addressed prior to her surgical intervention.  All of the patient's questions were answered today to her satisfaction.  I recommend she calls if she has any further questions.  I discussed with the patient that the consent form is necessary prior to any surgical intervention.  The risks that can be encountered with and after liposuction were discussed and include the following but no limited to these:  Asymmetry, fluid accumulation, firmness of the area, fat necrosis with death of fat tissue, bleeding, infection, delayed healing, anesthesia risks, skin sensation changes, injury to structures including nerves, blood vessels, and muscles which may be temporary or permanent, allergies to tape, suture materials and glues, blood products, topical preparations or  injected agents, skin and contour irregularities, skin discoloration and swelling, deep vein thrombosis, cardiac and pulmonary complications, pain, which may persist, persistent pain, recurrence of the lesion, poor healing of the incision, possible need for revisional surgery or staged procedures. Thiere can also be persistent swelling, poor wound healing, rippling or loose skin, worsening of cellulite, swelling, and thermal burn or heat injury from ultrasound with the ultrasound-assisted lipoplasty technique. Any change in weight fluctuations can alter the outcome.  The risk that can be encountered for this procedure were discussed and include the following but not limited to these: asymmetry, fluid accumulation, firmness of the tissue, skin loss, decrease or no sensation, fat necrosis, bleeding, infection, healing delay.  Deep vein thrombosis, cardiac and pulmonary complications are risks to any procedure.  There are risks of anesthesia,  changes to skin sensation and injury to nerves or blood vessels.  The muscle can be temporarily or permanently injured.  You may have an allergic reaction to tape, suture, glue, blood products which can result in skin discoloration, swelling, pain, skin lesions, poor healing.  Any of these can lead to the need for revisonal surgery or stage procedures.  Weight gain and weigh loss can also effect the long term appearance. The results are not guaranteed to last a lifetime.  Future surgery may be required.    Patient is unsure if they discussed liposuction at her consultation, if this is able to be added on, she would like to added on and receive an additional quote for this.  I discussed with the patient that I would reach out to our surgical team and confirm or deny if this is possible.  Patient has a COVID test scheduled for 03/29/2020.  If this is positive, surgery will need to be postponed approximately 4 to 6 weeks.  Electronically signed by: Kermit Balo Jmya Uliano, PA-C 03/25/2020 9:45 AM

## 2020-03-26 ENCOUNTER — Encounter: Payer: BC Managed Care – PPO | Admitting: Plastic Surgery

## 2020-03-29 ENCOUNTER — Ambulatory Visit: Payer: Managed Care, Other (non HMO)

## 2020-03-29 ENCOUNTER — Other Ambulatory Visit (HOSPITAL_COMMUNITY)
Admission: RE | Admit: 2020-03-29 | Discharge: 2020-03-29 | Disposition: A | Discharge status: Home / Self Care | Payer: Managed Care, Other (non HMO) | Source: Ambulatory Visit | Attending: Plastic Surgery | Admitting: Plastic Surgery

## 2020-03-29 DIAGNOSIS — U071 COVID-19: Secondary | ICD-10-CM | POA: Insufficient documentation

## 2020-03-29 DIAGNOSIS — Z01812 Encounter for preprocedural laboratory examination: Secondary | ICD-10-CM | POA: Insufficient documentation

## 2020-03-29 LAB — SARS CORONAVIRUS 2 (TAT 6-24 HRS): SARS Coronavirus 2: POSITIVE — AB

## 2020-04-01 DIAGNOSIS — Z719 Counseling, unspecified: Secondary | ICD-10-CM

## 2020-04-05 ENCOUNTER — Other Ambulatory Visit (HOSPITAL_COMMUNITY): Payer: Managed Care, Other (non HMO)

## 2020-04-16 ENCOUNTER — Encounter: Payer: BC Managed Care – PPO | Admitting: Plastic Surgery

## 2020-04-23 ENCOUNTER — Encounter: Payer: BC Managed Care – PPO | Admitting: Plastic Surgery

## 2020-04-26 ENCOUNTER — Ambulatory Visit: Payer: Managed Care, Other (non HMO) | Attending: Family Medicine

## 2020-04-26 ENCOUNTER — Other Ambulatory Visit: Payer: Self-pay

## 2020-04-26 DIAGNOSIS — M6281 Muscle weakness (generalized): Secondary | ICD-10-CM

## 2020-04-26 DIAGNOSIS — M25551 Pain in right hip: Secondary | ICD-10-CM | POA: Diagnosis not present

## 2020-04-26 NOTE — Therapy (Signed)
[facility] [address], [place], [address] Phone: [phone]   Fax:  [phone]  Physical Therapy Treatment  Patient Details  Name: Nicole Owens MRN: [id number] Date of Birth: 09-24-1980 Referring Provider (PT): Lynne Leader, MD   Encounter Date: 04/26/2020   PT End of Session - 04/26/20 1600    Visit Number 55    Number of Visits 37    Date for PT Re-Evaluation 06/10/20    PT Start Time 1601    PT Stop Time 1641    PT Time Calculation (min) 40 min    Activity Tolerance Patient tolerated treatment well    Behavior During Therapy [facility] for tasks assessed/performed           Past Medical History:  Diagnosis Date  . Asthma   . Asthma, intermittent    mild  . Carpal tunnel syndrome on both sides    with pregnancy  . Female infertility 04/30/2010   history restored after error with record merge  . Heartburn in pregnancy    nausea  . 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 Postpartum care following cesarean delivery 02/17/2012  . Rh negative, maternal 02/17/2012  . S/P cesarean section - triplets 11/29 02/17/2012    Past Surgical History:  Procedure Laterality Date  . CESAREAN SECTION  02/16/2012   Procedure: CESAREAN SECTION;  Surgeon: Lovenia Kim, MD;  Location: [facility];  Service: Obstetrics;  Laterality: N/A;  . NO PAST SURGERIES    . WISDOM TOOTH EXTRACTION      There were no vitals filed for this visit.   Subjective Assessment - 04/26/20 1603    Subjective R hip hurts. 5/10 currently. Pt worked today for 5-6 hours. R hip hurts more when standing on it. Was not able to do the surgery because her family got COVID. R hip started hurting again 2 weeks ago. Had a Cortisone shot 12/2019.    Pertinent History R hip pain. Pain began mid November 2020. Pt was at the gym. Pt was performing scissor kicks. Felt a little pain more than normal. Went for a backward lunge with R LE and felt sharp  pain R lower abdomen. Took 10 days off and iced that area. Did not really get better. Was diagnosed with sports hernia. Still getting pain but getting a little better. Could not jog before 6 weeks ago. Thinks she can jog slowly now. Using her R LE to stand up from a R half kneeling position bothers her. Pt goes to the gym 6 days a week before, no goes to the gym 1 time a week and performs modified upper body work out. Pivoting the wrong way sometimes would hurt. Going up the steps 2 at a time bothers her R hip. Getting out of bed involving SLR hip flexion movement would bother it. Coughing, sneezing would bother R hip. Had C section 7 years ago for triplets. The pain is along the line of the incision.    Currently in Pain? Yes    Pain Score 5               [facility] PT Assessment - 04/26/20 1628      Assessment   Referring Provider (PT) Lynne Leader, MD      Strength   Right Hip Flexion 4/5    Right Hip Extension 4/5    Right Hip ABduction 4+/5    Right Hip ADduction 4/5  PT Education - 04/26/20 1611    Education Details ther-ex    Person(s) Educated Patient    Methods Explanation;Demonstration;Tactile cues;Verbal cues    Comprehension Returned demonstration;Verbalized understanding          Objectives  No latex allergies     MedbridgeAccess Code: [email or web address]  Pt wants to work up to being able to do planks, pushups, sit ups, spider and mountain climbers, jumps   Hernia surgery scheduled for 06/17/2020, followed by hip.    Therapeutic exercise  seated R hip flexion isometrics 40% effort 5x 1 min with 1 min rest  Seated hip hip adduction ball squeeze 40% effort 5x 1 min with 1 min rest   Decreased R hip pain to 2/10 after aforementioned exercises    Manually resisted hip flexion, extension, abduction, adduction 1x each way  Reviewed current status with hip strength  Reviewed plan of care: continue 2x/week  for 6 weeks to recover progress.   Bridge with hip adduction ball squeeze 10x5 seconds for 3 sets   Improved exercise technique, movement at target joints, use of target muscles aftermin tomod verbal, visual, tactile cues.    Response to treatment Pt tolerated session well without aggravation of symptoms.Decreased R hip pain with isometric exercises. No R hip pain after session.    Clinical presentation Pt returns to PT after about a 1 month hiatus secondary to pt originally going to have surgery on 04/08/2020 which had to be postponed to March 2022. Pt currently presents with increased R hip pain, as well as hip weakness since previous progress report. Pt will benefit from continued skilled physical therapy services to regain progress and prepare for her surgery as well as decrease pain, improve strength and function.      PT Short Term Goals - 05/01/19 1149      PT SHORT TERM GOAL #1   Title Patient will be independent with her HEP to decrease pain, improve strength and function.    Baseline Pt has started her HEP (03/19/2019); Pt demonstrates independence with her HEP (05/01/2019)    Time 3    Period Weeks    Status Achieved    Target Date 04/10/19             PT Long Term Goals - 04/26/20 1604      PT LONG TERM GOAL #1   Title Pt will have a decrease in R hip pain to 2/10 or less at worst to promote ability to run, perform exercises, stand up from a half kneeling position more comfortably.    Baseline 6/10 R hip pain at most (03/19/2019); 2-3/10 at most for the past 7 days (05/01/2019); 2-3/10 at most for the past 7 days (05/12/19), (05/26/2019); 3/10 (07/08/2019); 4/10 at most for the past 7 days (after workout at her gym and softball)  2-3/10 at most for the past 7 days. (11/04/2019); 4/10  R hip pain at most for the past 7 days (12/31/2019); 2/10 at most for the past 7 days (02/18/2020); 5/10 at worst for the past 7 days (04/26/2020)    Time 6    Period Weeks     Status On-going    Target Date 06/10/20      PT LONG TERM GOAL #2   Title Patient will improve R hip flexion, extension, abduction, and adduction strength by at least 1/2 MMT grade to promote ability to perform functional tasks more comfortably.    Baseline improved by 1/2 MMT grade but pain with hip adduction (  05/26/2019); maintained except hip extension (07/08/2019); impmroved strength except hip extension (11/04/2019); improved strength at all target planes (12/31/2019); Decreased strength (04/26/2020)    Time 8    Period Weeks    Status Partially Met    Target Date 06/10/20      PT LONG TERM GOAL #3   Title Pt will be able to jog for at least 5 minutes without R hip pain to promote ability to participate in exercise and promote physical health    Baseline increased R hip pain when jogging or running (03/19/2019); able to perform mini jog with short stride length (05/01/2019), (05/26/2019); Able to jog about 5 min except for this past Monday (07/08/2019); Pt states being able to jog, but does not know if it is painless (09/01/2019); able to jog at treadmil speed 4.5 normal stride length, no pain (11/04/2019)    Time 6    Period Weeks    Status Achieved      PT LONG TERM GOAL #4   Title Pt will be able to perform R SLR hip flexion 5x independently as a demonstration of improved strength.    Baseline Unable to perform one full repetition without posterior pelvic tilt assist from pillow. (12/1/201); unable to perform (04/26/2020)    Time 6    Period Weeks    Status On-going    Target Date 06/10/20                 Plan - 04/26/20 1559    Clinical Impression Statement Pt returns to PT after about a 1 month hiatus secondary to pt originally going to have surgery on 04/08/2020 which had to be postponed to March 2022. Pt currently presents with increased R hip pain, as well as hip weakness since previous progress report. Pt will benefit from continued skilled physical therapy services to regain  progress and prepare for her surgery as well as decrease pain, improve strength and function.    Personal Factors and Comorbidities Comorbidity 1    Comorbidities hx of C-section    Examination-Activity Limitations Squat    Stability/Clinical Decision Making Stable/Uncomplicated    Rehab Potential Good    PT Frequency 1x / week    PT Duration 8 weeks    PT Treatment/Interventions Therapeutic activities;Therapeutic exercise;Neuromuscular re-education;Patient/family education;Manual techniques;Dry needling;Spinal Manipulations;Joint Manipulations;Aquatic Therapy;Electrical Stimulation;Iontophoresis 79m/ml Dexamethasone;Ultrasound    PT Next Visit Plan isometric, then concentric, then eccentric hip and abdominal strengthening, manual techniques, modalities PRN    PT Home Exercise Plan Medbridge Access Code: [email or web address]    Consulted and Agree with Plan of Care Patient           Patient will benefit from skilled therapeutic intervention in order to improve the following deficits and impairments:  Pain,Improper body mechanics,Postural dysfunction,Decreased strength  Visit Diagnosis: Pain in right hip - Plan: PT plan of care cert/re-cert  Muscle weakness (generalized) - Plan: PT plan of care cert/re-cert     Problem List Patient Active Problem List   Diagnosis Date Noted  . 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 S/P cesarean section - triplets 11/29 02/17/2012  . Postpartum care following cesarean delivery (11/29) 02/17/2012  . Rh negative, maternal 02/17/2012  . 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 Routine general medical examination at a health care facility 06/23/2010  . FIBROCYSTIC BREAST DISEASE 07/17/2008  . Asthma, 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   MJoneen BoersPT, DPT   04/26/2020, 6:46 PM  [facility] [address], [place], [address] Phone: [phone]   Fax:  [phone]  Name: Nicole Owens MRN: [id number] Date of Birth: [birth year]

## 2020-05-06 ENCOUNTER — Ambulatory Visit: Payer: Managed Care, Other (non HMO)

## 2020-05-06 ENCOUNTER — Other Ambulatory Visit: Payer: Self-pay

## 2020-05-06 DIAGNOSIS — M25551 Pain in right hip: Secondary | ICD-10-CM

## 2020-05-06 DIAGNOSIS — M6281 Muscle weakness (generalized): Secondary | ICD-10-CM

## 2020-05-06 NOTE — Therapy (Signed)
[facility] [address], [place], [address] Phone: [phone]   Fax:  [phone]  Physical Therapy Treatment  Patient Details  Name: Nicole Owens MRN: [id number] Date of Birth: Jan 09, 1981 Referring Provider (PT): Lynne Leader, MD   Encounter Date: 05/06/2020   PT End of Session - 05/06/20 1116    Visit Number 56    Number of Visits 59    Date for PT Re-Evaluation 06/10/20    PT Start Time 1116    PT Stop Time 1155    PT Time Calculation (min) 39 min    Activity Tolerance Patient tolerated treatment well    Behavior During Therapy [facility] for tasks assessed/performed           Past Medical History:  Diagnosis Date  . Asthma   . Asthma, intermittent    mild  . Carpal tunnel syndrome on both sides    with pregnancy  . Female infertility 04/30/2010   history restored after error with record merge  . Heartburn in pregnancy    nausea  . 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 Postpartum care following cesarean delivery 02/17/2012  . Rh negative, maternal 02/17/2012  . S/P cesarean section - triplets 11/29 02/17/2012    Past Surgical History:  Procedure Laterality Date  . CESAREAN SECTION  02/16/2012   Procedure: CESAREAN SECTION;  Surgeon: Lovenia Kim, MD;  Location: [facility];  Service: Obstetrics;  Laterality: N/A;  . NO PAST SURGERIES    . WISDOM TOOTH EXTRACTION      There were no vitals filed for this visit.   Subjective Assessment - 05/06/20 1118    Subjective R hip pain is 4/10 currently. Has not been able to do her exercises. Has been working a lot.    Pertinent History R hip pain. Pain began mid November 2020. Pt was at the gym. Pt was performing scissor kicks. Felt a little pain more than normal. Went for a backward lunge with R LE and felt sharp pain R lower abdomen. Took 10 days off and iced that area. Did not really get better. Was diagnosed with sports hernia. Still getting  pain but getting a little better. Could not jog before 6 weeks ago. Thinks she can jog slowly now. Using her R LE to stand up from a R half kneeling position bothers her. Pt goes to the gym 6 days a week before, no goes to the gym 1 time a week and performs modified upper body work out. Pivoting the wrong way sometimes would hurt. Going up the steps 2 at a time bothers her R hip. Getting out of bed involving SLR hip flexion movement would bother it. Coughing, sneezing would bother R hip. Had C section 7 years ago for triplets. The pain is along the line of the incision.    Currently in Pain? Yes    Pain Score 4                                      PT Education - 05/06/20 1124    Education Details ther-ex    Person(s) Educated Patient    Methods Explanation;Demonstration;Tactile cues;Verbal cues    Comprehension Returned demonstration;Verbalized understanding            Objectives  No latex allergies     MedbridgeAccess Code: [email or web address]  Pt wants to work  up to being able to do planks, pushups, sit ups, spider and mountain climbers, jumps   Hernia surgery scheduled for 06/17/2020, followed by hip.    Therapeutic exercise  Supine transversus abdominis contraction 10x10 seconds   then with "Closing the gap" using the sheet/gown 10x3     Then chair position with alternating leg extension 10x2 each LE alternating   Pt states being able to lift R leg easier with using sheet to close the gap with the exercise.   Quadruped bird dog 2x each LE. Abdominal discomfort.   Seated R hip flexion 10x3  Bridge with hip adduction ball squeeze 10x5 seconds for 3 sets     Improved exercise technique, movement at target joints, use of target muscles aftermin tomod verbal, visual, tactile cues.    Response to treatment Pt tolerated session well without aggravation of symptoms.Decreased R hip pain with concentric R hip flexion muscle  activation.    Clinical presentation Worked on improving transversus abdominis muscle strengthening with approximation of both sides of rectus abdominis via external assist such as sheet to promote core strength. Decreased R hip pain with concentric activation of iliopsoas muscle. Pt tolerated session well without aggravation of symptoms. Good muscle use reported by pt. Pt will benefit from continued skilled physical therapy services to decrease pain, improve strength and function.          PT Short Term Goals - 05/01/19 1149      PT SHORT TERM GOAL #1   Title Patient will be independent with her HEP to decrease pain, improve strength and function.    Baseline Pt has started her HEP (03/19/2019); Pt demonstrates independence with her HEP (05/01/2019)    Time 3    Period Weeks    Status Achieved    Target Date 04/10/19             PT Long Term Goals - 04/26/20 1604      PT LONG TERM GOAL #1   Title Pt will have a decrease in R hip pain to 2/10 or less at worst to promote ability to run, perform exercises, stand up from a half kneeling position more comfortably.    Baseline 6/10 R hip pain at most (03/19/2019); 2-3/10 at most for the past 7 days (05/01/2019); 2-3/10 at most for the past 7 days (05/12/19), (05/26/2019); 3/10 (07/08/2019); 4/10 at most for the past 7 days (after workout at her gym and softball)  2-3/10 at most for the past 7 days. (11/04/2019); 4/10  R hip pain at most for the past 7 days (12/31/2019); 2/10 at most for the past 7 days (02/18/2020); 5/10 at worst for the past 7 days (04/26/2020)    Time 6    Period Weeks    Status On-going    Target Date 06/10/20      PT LONG TERM GOAL #2   Title Patient will improve R hip flexion, extension, abduction, and adduction strength by at least 1/2 MMT grade to promote ability to perform functional tasks more comfortably.    Baseline improved by 1/2 MMT grade but pain with hip adduction (05/26/2019); maintained except hip extension  (07/08/2019); impmroved strength except hip extension (11/04/2019); improved strength at all target planes (12/31/2019); Decreased strength (04/26/2020)    Time 8    Period Weeks    Status Partially Met    Target Date 06/10/20      PT LONG TERM GOAL #3   Title Pt will be able to jog for  at least 5 minutes without R hip pain to promote ability to participate in exercise and promote physical health    Baseline increased R hip pain when jogging or running (03/19/2019); able to perform mini jog with short stride length (05/01/2019), (05/26/2019); Able to jog about 5 min except for this past Monday (07/08/2019); Pt states being able to jog, but does not know if it is painless (09/01/2019); able to jog at treadmil speed 4.5 normal stride length, no pain (11/04/2019)    Time 6    Period Weeks    Status Achieved      PT LONG TERM GOAL #4   Title Pt will be able to perform R SLR hip flexion 5x independently as a demonstration of improved strength.    Baseline Unable to perform one full repetition without posterior pelvic tilt assist from pillow. (12/1/201); unable to perform (04/26/2020)    Time 6    Period Weeks    Status On-going    Target Date 06/10/20                 Plan - 05/06/20 1114    Clinical Impression Statement Worked on improving transversus abdominis muscle strengthening with approximation of both sides of rectus abdominis via external assist such as sheet to promote core strength. Decreased R hip pain with concentric activation of iliopsoas muscle. Pt tolerated session well without aggravation of symptoms. Good muscle use reported by pt. Pt will benefit from continued skilled physical therapy services to decrease pain, improve strength and function.    Personal Factors and Comorbidities Comorbidity 1    Comorbidities hx of C-section    Examination-Activity Limitations Squat    Stability/Clinical Decision Making Stable/Uncomplicated    Rehab Potential Good    PT Frequency 1x / week     PT Duration 8 weeks    PT Treatment/Interventions Therapeutic activities;Therapeutic exercise;Neuromuscular re-education;Patient/family education;Manual techniques;Dry needling;Spinal Manipulations;Joint Manipulations;Aquatic Therapy;Electrical Stimulation;Iontophoresis 74m/ml Dexamethasone;Ultrasound    PT Next Visit Plan isometric, then concentric, then eccentric hip and abdominal strengthening, manual techniques, modalities PRN    PT Home Exercise Plan Medbridge Access Code: [email or web address]    Consulted and Agree with Plan of Care Patient           Patient will benefit from skilled therapeutic intervention in order to improve the following deficits and impairments:  Pain,Improper body mechanics,Postural dysfunction,Decreased strength  Visit Diagnosis: Pain in right hip  Muscle weakness (generalized)     Problem List Patient Active Problem List   Diagnosis Date Noted  . 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 S/P cesarean section - triplets 11/29 02/17/2012  . Postpartum care following cesarean delivery (11/29) 02/17/2012  . Rh negative, maternal 02/17/2012  . 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 Routine general medical examination at a health care facility 06/23/2010  . FIBROCYSTIC BREAST DISEASE 07/17/2008  . Asthma, 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    MJoneen BoersPT, DPT   05/06/2020, 12:39 PM  [facility] [address] [place] [id number]: [phone]  Fax:  [phone] Name: NLORANDA MASTELMRN: [id number] of Birth: [birth year]

## 2020-05-06 NOTE — Patient Instructions (Signed)
Access Code: [email or web address] URL: [email or web address] Date: 05/06/2020 Prepared by: Loralyn Freshwater  Exercises Supine Bridge with [employer] Between Knees - 1 x daily - 7 x weekly - 10 reps - 3 sets - 5 seconds hold Bent Knee Fallouts - 1 x daily - 7 x weekly - 10 reps - 3 sets Single Leg Stance with Support - 3 x daily - 7 x weekly - 10 reps - 3 sets - 5 seconds hold Shoulder extension with resistance - Neutral - 1 x daily - 7 x weekly - 10 reps - 3 sets - 10 hold Straight Leg Raise - 1 x daily - 7 x weekly - 2 sets - 10 reps Prone Quadriceps Stretch with Strap - 3 x daily - 7 x weekly - 1 sets - 3 reps - 30 seconds hold Seated Hip Adduction Isometrics with Ball - 1 x daily - 7 x weekly - 3 sets - 10 reps - 10 seconds hold Bird Dog - 1 x daily - 7 x weekly - 2 sets - 10 reps Supine Transversus Abdominis Bracing with Heel Slide - 1 x daily - 7 x weekly - 2 sets - 10 reps Supine March with Posterior Pelvic Tilt - 1 x daily - 7 x weekly - 3 sets - 20 reps - 5 seconds hold Standing Hip Flexion with Ankle Weight - 1 x daily - 7 x weekly - 2 sets - 10 reps

## 2020-05-11 ENCOUNTER — Other Ambulatory Visit: Payer: Self-pay

## 2020-05-11 ENCOUNTER — Ambulatory Visit: Payer: Managed Care, Other (non HMO)

## 2020-05-11 DIAGNOSIS — M25551 Pain in right hip: Secondary | ICD-10-CM

## 2020-05-11 DIAGNOSIS — M6281 Muscle weakness (generalized): Secondary | ICD-10-CM

## 2020-05-11 NOTE — Therapy (Signed)
[facility] [address] [place], [address] Phone: [phone]   Fax:  [phone]  Physical Therapy Treatment  Patient Details  Name: Nicole Owens MRN: [id number] Date of Birth: 1980/05/18 Referring Provider (PT): Lynne Leader, MD   Encounter Date: 05/11/2020   PT End of Session - 05/11/20 1122    Visit Number 46    Number of Visits 24    Date for PT Re-Evaluation 06/10/20    PT Start Time 1122    PT Stop Time 1200    PT Time Calculation (min) 38 min    Activity Tolerance Patient tolerated treatment well    Behavior During Therapy [facility] for tasks assessed/performed           Past Medical History:  Diagnosis Date  . Asthma   . Asthma, intermittent    mild  . Carpal tunnel syndrome on both sides    with pregnancy  . Female infertility 04/30/2010   history restored after error with record merge  . Heartburn in pregnancy    nausea  . 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 Postpartum care following cesarean delivery 02/17/2012  . Rh negative, maternal 02/17/2012  . S/P cesarean section - triplets 11/29 02/17/2012    Past Surgical History:  Procedure Laterality Date  . CESAREAN SECTION  02/16/2012   Procedure: CESAREAN SECTION;  Surgeon: Lovenia Kim, MD;  Location: [facility];  Service: Obstetrics;  Laterality: N/A;  . NO PAST SURGERIES    . WISDOM TOOTH EXTRACTION      There were no vitals filed for this visit.   Subjective Assessment - 05/11/20 1124    Subjective R hip feels pretty good. No pain currently.    Pertinent History R hip pain. Pain began mid November 2020. Pt was at the gym. Pt was performing scissor kicks. Felt a little pain more than normal. Went for a backward lunge with R LE and felt sharp pain R lower abdomen. Took 10 days off and iced that area. Did not really get better. Was diagnosed with sports hernia. Still getting pain but getting a little better. Could not jog  before 6 weeks ago. Thinks she can jog slowly now. Using her R LE to stand up from a R half kneeling position bothers her. Pt goes to the gym 6 days a week before, no goes to the gym 1 time a week and performs modified upper body work out. Pivoting the wrong way sometimes would hurt. Going up the steps 2 at a time bothers her R hip. Getting out of bed involving SLR hip flexion movement would bother it. Coughing, sneezing would bother R hip. Had C section 7 years ago for triplets. The pain is along the line of the incision.    Currently in Pain? No/denies                                     PT Education - 05/11/20 1129    Education Details ther-ex    Person(s) Educated Patient    Methods Explanation;Demonstration;Tactile cues;Verbal cues    Comprehension Returned demonstration;Verbalized understanding           Objectives  No latex allergies     MedbridgeAccess Code: [email or web address]  Pt wants to work up to being able to do planks, pushups, sit ups, spider and mountain climbers, jumps   Hernia surgery  scheduled for3/31/2022, followed by hip.   Therapeutic exercise  Quadruped hip extension 2x. Abdominal discomfort. Eases with rest.   Supine transversus abdominis contraction 10x10 seconds for 2 sets             then with "Closing the gap" using the sheet/gown 10x3                Then chair position with alternating leg extension 10x3 each LE alternating              Pt states being able to lift R leg easier with using sheet to close the gap with the exercise.    Bridge 10x5 seconds for 3 sets  Seated R hip flexion 10x3   SLS on R LE 10x5 seconds for 2 sets  Static mini lunge   R 10x3   Improved exercise technique, movement at target joints, use of target muscles aftermin tomod verbal, visual, tactile cues.    Response to treatment Pt tolerated session well without aggravation of symptoms.  Clinical  presentation Continued working on gentle core (with approximatiun of both sides of rectus abdominis) and hip strength to promote ability raise her R thigh. Pt tolerated session well without aggravation of symptoms. Pt will benefit from continued skilled physical therapy services to decrease pain, improve strength and function.      PT Short Term Goals - 05/01/19 1149      PT SHORT TERM GOAL #1   Title Patient will be independent with her HEP to decrease pain, improve strength and function.    Baseline Pt has started her HEP (03/19/2019); Pt demonstrates independence with her HEP (05/01/2019)    Time 3    Period Weeks    Status Achieved    Target Date 04/10/19             PT Long Term Goals - 04/26/20 1604      PT LONG TERM GOAL #1   Title Pt will have a decrease in R hip pain to 2/10 or less at worst to promote ability to run, perform exercises, stand up from a half kneeling position more comfortably.    Baseline 6/10 R hip pain at most (03/19/2019); 2-3/10 at most for the past 7 days (05/01/2019); 2-3/10 at most for the past 7 days (05/12/19), (05/26/2019); 3/10 (07/08/2019); 4/10 at most for the past 7 days (after workout at her gym and softball)  2-3/10 at most for the past 7 days. (11/04/2019); 4/10  R hip pain at most for the past 7 days (12/31/2019); 2/10 at most for the past 7 days (02/18/2020); 5/10 at worst for the past 7 days (04/26/2020)    Time 6    Period Weeks    Status On-going    Target Date 06/10/20      PT LONG TERM GOAL #2   Title Patient will improve R hip flexion, extension, abduction, and adduction strength by at least 1/2 MMT grade to promote ability to perform functional tasks more comfortably.    Baseline improved by 1/2 MMT grade but pain with hip adduction (05/26/2019); maintained except hip extension (07/08/2019); impmroved strength except hip extension (11/04/2019); improved strength at all target planes (12/31/2019); Decreased strength (04/26/2020)    Time 8     Period Weeks    Status Partially Met    Target Date 06/10/20      PT LONG TERM GOAL #3   Title Pt will be able to jog for at least 5 minutes without R  hip pain to promote ability to participate in exercise and promote physical health    Baseline increased R hip pain when jogging or running (03/19/2019); able to perform mini jog with short stride length (05/01/2019), (05/26/2019); Able to jog about 5 min except for this past Monday (07/08/2019); Pt states being able to jog, but does not know if it is painless (09/01/2019); able to jog at treadmil speed 4.5 normal stride length, no pain (11/04/2019)    Time 6    Period Weeks    Status Achieved      PT LONG TERM GOAL #4   Title Pt will be able to perform R SLR hip flexion 5x independently as a demonstration of improved strength.    Baseline Unable to perform one full repetition without posterior pelvic tilt assist from pillow. (12/1/201); unable to perform (04/26/2020)    Time 6    Period Weeks    Status On-going    Target Date 06/10/20                 Plan - 05/11/20 1129    Clinical Impression Statement Continued working on gentle core (with approximatiun of both sides of rectus abdominis) and hip strength to promote ability raise her R thigh. Pt tolerated session well without aggravation of symptoms. Pt will benefit from continued skilled physical therapy services to decrease pain, improve strength and function.    Personal Factors and Comorbidities Comorbidity 1    Comorbidities hx of C-section    Examination-Activity Limitations Squat    Stability/Clinical Decision Making Stable/Uncomplicated    Rehab Potential Good    PT Frequency 1x / week    PT Duration 8 weeks    PT Treatment/Interventions Therapeutic activities;Therapeutic exercise;Neuromuscular re-education;Patient/family education;Manual techniques;Dry needling;Spinal Manipulations;Joint Manipulations;Aquatic Therapy;Electrical Stimulation;Iontophoresis 64m/ml  Dexamethasone;Ultrasound    PT Next Visit Plan isometric, then concentric, then eccentric hip and abdominal strengthening, manual techniques, modalities PRN    PT Home Exercise Plan Medbridge Access Code: [email or web address]    Consulted and Agree with Plan of Care Patient           Patient will benefit from skilled therapeutic intervention in order to improve the following deficits and impairments:  Pain,Improper body mechanics,Postural dysfunction,Decreased strength  Visit Diagnosis: Pain in right hip  Muscle weakness (generalized)     Problem List Patient Active Problem List   Diagnosis Date Noted  . 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 S/P cesarean section - triplets 11/29 02/17/2012  . Postpartum care following cesarean delivery (11/29) 02/17/2012  . Rh negative, maternal 02/17/2012  . 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 Routine general medical examination at a health care facility 06/23/2010  . FIBROCYSTIC BREAST DISEASE 07/17/2008  . Asthma, 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    MJoneen BoersPT, DPT   05/11/2020, 12:23 PM  [facility] [address] [place] [id number]: [phone]  Fax:  [phone] Name: Nicole TALIERCIOMRN: [id number] of Birth: 9Apr 03, 1982

## 2020-05-13 ENCOUNTER — Ambulatory Visit: Payer: Managed Care, Other (non HMO)

## 2020-05-13 ENCOUNTER — Other Ambulatory Visit: Payer: Self-pay

## 2020-05-13 DIAGNOSIS — M6281 Muscle weakness (generalized): Secondary | ICD-10-CM

## 2020-05-13 DIAGNOSIS — M25551 Pain in right hip: Secondary | ICD-10-CM

## 2020-05-13 NOTE — Therapy (Signed)
[facility] [address] [place], [address] Phone: [phone]   Fax:  [phone]  Physical Therapy Treatment  Patient Details  Name: Nicole Owens MRN: [id number] Date of Birth: [birth year] Referring Provider (PT): Lynne Leader, MD   Encounter Date: 05/13/2020   PT End of Session - 05/13/20 0949    Visit Number 66    Number of Visits 24    Date for PT Re-Evaluation 06/10/20    PT Start Time 0949    PT Stop Time 1032    PT Time Calculation (min) 43 min    Activity Tolerance Patient tolerated treatment well    Behavior During Therapy [facility] for tasks assessed/performed           Past Medical History:  Diagnosis Date  . Asthma   . Asthma, intermittent    mild  . Carpal tunnel syndrome on both sides    with pregnancy  . Female infertility 04/30/2010   history restored after error with record merge  . Heartburn in pregnancy    nausea  . 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 Postpartum care following cesarean delivery 02/17/2012  . Rh negative, maternal 02/17/2012  . S/P cesarean section - triplets 11/29 02/17/2012    Past Surgical History:  Procedure Laterality Date  . CESAREAN SECTION  02/16/2012   Procedure: CESAREAN SECTION;  Surgeon: Lovenia Kim, MD;  Location: [facility];  Service: Obstetrics;  Laterality: N/A;  . NO PAST SURGERIES    . WISDOM TOOTH EXTRACTION      There were no vitals filed for this visit.   Subjective Assessment - 05/13/20 0950    Subjective R hip sis sore, it hurts a little like 2-3/10. Started this morning after waking up. Could be the weather. Did not remember it hurting yesterday but worked 6 hours yesterday on her feet.    Pertinent History R hip pain. Pain began mid November 2020. Pt was at the gym. Pt was performing scissor kicks. Felt a little pain more than normal. Went for a backward lunge with R LE and felt sharp pain R lower abdomen. Took 10 days off and  iced that area. Did not really get better. Was diagnosed with sports hernia. Still getting pain but getting a little better. Could not jog before 6 weeks ago. Thinks she can jog slowly now. Using her R LE to stand up from a R half kneeling position bothers her. Pt goes to the gym 6 days a week before, no goes to the gym 1 time a week and performs modified upper body work out. Pivoting the wrong way sometimes would hurt. Going up the steps 2 at a time bothers her R hip. Getting out of bed involving SLR hip flexion movement would bother it. Coughing, sneezing would bother R hip. Had C section 7 years ago for triplets. The pain is along the line of the incision.    Currently in Pain? Yes    Pain Score 3     Pain Location Hip    Pain Orientation Right                                     PT Education - 05/13/20 0952    Education Details ther-ex    Person(s) Educated Patient    Methods Explanation;Demonstration;Tactile cues;Verbal cues    Comprehension Returned demonstration;Verbalized understanding  Objectives  No latex allergies     MedbridgeAccess Code: [email or web address]  Pt wants to work up to being able to do planks, pushups, sit ups, spider and mountain climbers, jumps   Hernia surgery scheduled for3/31/2022, followed by hip.   Therapeutic exercise  Seated R hip flexion 10x3  Side stepping while maintaining mini squat 32 ft to the R and 32 ft to the L   Forward mini walking lunges 32 ft x 2  Standing R hip adduction using slider with B UE assist 10x2  Standing R hip flexion/extension with R foot on slider with B UE assist 5x2  Then with PT assist with abdominal bracing with "closing the gap" using green theraband wrapped around abdomen 10x  Better able to perform with less difficulty (still difficult but better) suggesting medical necessity for improving core strength/function.   SLS    R LE 30 seconds x3  Mini jogs forward  and back about 12 ft each for 1 minute x 3  Side stepping mini jog 32 ft to the L and 32 ft to the R 5x  Ladder drills slow  Forward in and out 6x  Lateral in and out 5x L and R  Standing hip "Open the gates" and "Close the gates"  R 2x. Very difficult to unable to perform   Improved exercise technique, movement at target joints, use of target muscles aftermin tomod verbal, visual, tactile cues.    Response to treatment Pt tolerated session well without aggravation of symptoms.  Clinical presentation Decreased R hip pain during session. Improved hip flexion with assist for core activation suggesting medical necessity to help improve core strength and function. Continued working concentric hip activation with core activation to promote loading to R hip flexor muscles to promote healing while promoting core strength to promote better muscle activation of R LE. Pt tolerated session well without aggravation of symptoms. Pt will benefit from continued skilled physical therapy services to decrease pain, improve strength and function.        PT Short Term Goals - 05/01/19 1149      PT SHORT TERM GOAL #1   Title Patient will be independent with her HEP to decrease pain, improve strength and function.    Baseline Pt has started her HEP (03/19/2019); Pt demonstrates independence with her HEP (05/01/2019)    Time 3    Period Weeks    Status Achieved    Target Date 04/10/19             PT Long Term Goals - 04/26/20 1604      PT LONG TERM GOAL #1   Title Pt will have a decrease in R hip pain to 2/10 or less at worst to promote ability to run, perform exercises, stand up from a half kneeling position more comfortably.    Baseline 6/10 R hip pain at most (03/19/2019); 2-3/10 at most for the past 7 days (05/01/2019); 2-3/10 at most for the past 7 days (05/12/19), (05/26/2019); 3/10 (07/08/2019); 4/10 at most for the past 7 days (after workout at her gym and softball)  2-3/10 at  most for the past 7 days. (11/04/2019); 4/10  R hip pain at most for the past 7 days (12/31/2019); 2/10 at most for the past 7 days (02/18/2020); 5/10 at worst for the past 7 days (04/26/2020)    Time 6    Period Weeks    Status On-going    Target Date 06/10/20      PT LONG  TERM GOAL #2   Title Patient will improve R hip flexion, extension, abduction, and adduction strength by at least 1/2 MMT grade to promote ability to perform functional tasks more comfortably.    Baseline improved by 1/2 MMT grade but pain with hip adduction (05/26/2019); maintained except hip extension (07/08/2019); impmroved strength except hip extension (11/04/2019); improved strength at all target planes (12/31/2019); Decreased strength (04/26/2020)    Time 8    Period Weeks    Status Partially Met    Target Date 06/10/20      PT LONG TERM GOAL #3   Title Pt will be able to jog for at least 5 minutes without R hip pain to promote ability to participate in exercise and promote physical health    Baseline increased R hip pain when jogging or running (03/19/2019); able to perform mini jog with short stride length (05/01/2019), (05/26/2019); Able to jog about 5 min except for this past Monday (07/08/2019); Pt states being able to jog, but does not know if it is painless (09/01/2019); able to jog at treadmil speed 4.5 normal stride length, no pain (11/04/2019)    Time 6    Period Weeks    Status Achieved      PT LONG TERM GOAL #4   Title Pt will be able to perform R SLR hip flexion 5x independently as a demonstration of improved strength.    Baseline Unable to perform one full repetition without posterior pelvic tilt assist from pillow. (12/1/201); unable to perform (04/26/2020)    Time 6    Period Weeks    Status On-going    Target Date 06/10/20                 Plan - 05/13/20 0952    Clinical Impression Statement Decreased R hip pain during session. Improved hip flexion with assist for core activation suggesting medical  necessity to help improve core strength and function. Continued working concentric hip activation with core activation to promote loading to R hip flexor muscles to promote healing while promoting core strength to promote better muscle activation of R LE. Pt tolerated session well without aggravation of symptoms. Pt will benefit from continued skilled physical therapy services to decrease pain, improve strength and function.    Personal Factors and Comorbidities Comorbidity 1    Comorbidities hx of C-section    Examination-Activity Limitations Squat    Stability/Clinical Decision Making Stable/Uncomplicated    Rehab Potential Good    PT Frequency 1x / week    PT Duration 8 weeks    PT Treatment/Interventions Therapeutic activities;Therapeutic exercise;Neuromuscular re-education;Patient/family education;Manual techniques;Dry needling;Spinal Manipulations;Joint Manipulations;Aquatic Therapy;Electrical Stimulation;Iontophoresis 4mg /ml Dexamethasone;Ultrasound    PT Next Visit Plan isometric, then concentric, then eccentric hip and abdominal strengthening, manual techniques, modalities PRN    PT Home Exercise Plan Medbridge Access Code: [email or web address]    Consulted and Agree with Plan of Care Patient           Patient will benefit from skilled therapeutic intervention in order to improve the following deficits and impairments:  Pain,Improper body mechanics,Postural dysfunction,Decreased strength  Visit Diagnosis: Pain in right hip  Muscle weakness (generalized)     Problem List Patient Active Problem List   Diagnosis Date Noted  . 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 S/P cesarean section - triplets 11/29 02/17/2012  . Postpartum care following cesarean delivery (11/29) 02/17/2012  . Rh negative, maternal 02/17/2012  . 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 Routine general medical examination  at a health care facility 06/23/2010  . FIBROCYSTIC BREAST DISEASE 07/17/2008  . Asthma,  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    Joneen Boers PT, DPT   05/13/2020, 12:24 PM  [facility] [address], [place], [address] Phone: [phone]   Fax:  [phone]  Name: Nicole Owens MRN: [id number] Date of Birth: [birth year]

## 2020-05-18 ENCOUNTER — Other Ambulatory Visit: Payer: Self-pay

## 2020-05-18 ENCOUNTER — Ambulatory Visit: Payer: Managed Care, Other (non HMO) | Attending: Family Medicine

## 2020-05-18 DIAGNOSIS — M6281 Muscle weakness (generalized): Secondary | ICD-10-CM | POA: Diagnosis present

## 2020-05-18 DIAGNOSIS — M25551 Pain in right hip: Secondary | ICD-10-CM | POA: Insufficient documentation

## 2020-05-18 NOTE — Therapy (Signed)
[facility] [address] [place], [address] Phone: [phone]   Fax:  [phone]  Physical Therapy Treatment  Patient Details  Name: Nicole Owens MRN: [id number] Date of Birth: Jul 24, 1980 Referring Provider (PT): Lynne Leader, MD   Encounter Date: 05/18/2020   PT End of Session - 05/18/20 1106    Visit Number 59    Number of Visits 71    Date for PT Re-Evaluation 06/10/20    PT Start Time 1033    PT Stop Time 1115    PT Time Calculation (min) 42 min    Activity Tolerance Patient tolerated treatment well    Behavior During Therapy [facility] for tasks assessed/performed           Past Medical History:  Diagnosis Date  . Asthma   . Asthma, intermittent    mild  . Carpal tunnel syndrome on both sides    with pregnancy  . Female infertility 04/30/2010   history restored after error with record merge  . Heartburn in pregnancy    nausea  . 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 Postpartum care following cesarean delivery 02/17/2012  . Rh negative, maternal 02/17/2012  . S/P cesarean section - triplets 11/29 02/17/2012    Past Surgical History:  Procedure Laterality Date  . CESAREAN SECTION  02/16/2012   Procedure: CESAREAN SECTION;  Surgeon: Lovenia Kim, MD;  Location: [facility];  Service: Obstetrics;  Laterality: N/A;  . NO PAST SURGERIES    . WISDOM TOOTH EXTRACTION      There were no vitals filed for this visit.   Subjective Assessment - 05/18/20 1039    Subjective Both quads were sore for a few days after last session. R hip was hurting a little bit earlier today. Very little annoying pain currently. 2/10 currently.    Pertinent History R hip pain. Pain began mid November 2020. Pt was at the gym. Pt was performing scissor kicks. Felt a little pain more than normal. Went for a backward lunge with R LE and felt sharp pain R lower abdomen. Took 10 days off and iced that area. Did not really  get better. Was diagnosed with sports hernia. Still getting pain but getting a little better. Could not jog before 6 weeks ago. Thinks she can jog slowly now. Using her R LE to stand up from a R half kneeling position bothers her. Pt goes to the gym 6 days a week before, no goes to the gym 1 time a week and performs modified upper body work out. Pivoting the wrong way sometimes would hurt. Going up the steps 2 at a time bothers her R hip. Getting out of bed involving SLR hip flexion movement would bother it. Coughing, sneezing would bother R hip. Had C section 7 years ago for triplets. The pain is along the line of the incision.    Currently in Pain? Yes    Pain Score 2                                      PT Education - 05/18/20 1049    Education Details ther-ex    Person(s) Educated Patient    Methods Explanation;Demonstration;Tactile cues;Verbal cues    Comprehension Returned demonstration;Verbalized understanding          Objectives  No latex allergies     MedbridgeAccess Code:  [email or web address]  Pt wants to work up to being able to do planks, pushups, sit ups, spider and mountain climbers, jumps   Hernia surgery scheduled for3/31/2022, followed by hip.   Therapeutic exercise with strap around abdomen:  Seated R hip flexion with PT assist from elevated mat table 5x3  Seated hip adduction ball squeeze 10x10 seconds   Prone glute max extension 10x2 with pillows under abdomen  R S/L R hip adduction 5x3  Supine hip fallouts 10x2 each LE   Bridge with march 10x3   Standing R hip adduction using slider with B UE assist 10x3    Improved exercise technique, movement at target joints, use of target muscles aftermin tomod verbal, visual, tactile cues.    Response to treatment Pt tolerated session well without aggravation of symptoms.  Clinical presentation Continued working on gentle core and glute strength to promote  function. Also worked on hip flexion concentric contraction with assist with greater ranges to promote ability to raise her R thigh up. Still demonstrates significant R hip flexion weakness. Pt tolerated session well without aggravation of symptoms. Pt will benefit from continued skilled physical therapy services to decrease pain, improve strength and function.         PT Short Term Goals - 05/01/19 1149      PT SHORT TERM GOAL #1   Title Patient will be independent with her HEP to decrease pain, improve strength and function.    Baseline Pt has started her HEP (03/19/2019); Pt demonstrates independence with her HEP (05/01/2019)    Time 3    Period Weeks    Status Achieved    Target Date 04/10/19             PT Long Term Goals - 04/26/20 1604      PT LONG TERM GOAL #1   Title Pt will have a decrease in R hip pain to 2/10 or less at worst to promote ability to run, perform exercises, stand up from a half kneeling position more comfortably.    Baseline 6/10 R hip pain at most (03/19/2019); 2-3/10 at most for the past 7 days (05/01/2019); 2-3/10 at most for the past 7 days (05/12/19), (05/26/2019); 3/10 (07/08/2019); 4/10 at most for the past 7 days (after workout at her gym and softball)  2-3/10 at most for the past 7 days. (11/04/2019); 4/10  R hip pain at most for the past 7 days (12/31/2019); 2/10 at most for the past 7 days (02/18/2020); 5/10 at worst for the past 7 days (04/26/2020)    Time 6    Period Weeks    Status On-going    Target Date 06/10/20      PT LONG TERM GOAL #2   Title Patient will improve R hip flexion, extension, abduction, and adduction strength by at least 1/2 MMT grade to promote ability to perform functional tasks more comfortably.    Baseline improved by 1/2 MMT grade but pain with hip adduction (05/26/2019); maintained except hip extension (07/08/2019); impmroved strength except hip extension (11/04/2019); improved strength at all target planes (12/31/2019); Decreased  strength (04/26/2020)    Time 8    Period Weeks    Status Partially Met    Target Date 06/10/20      PT LONG TERM GOAL #3   Title Pt will be able to jog for at least 5 minutes without R hip pain to promote ability to participate in exercise and promote physical health    Baseline increased R hip  pain when jogging or running (03/19/2019); able to perform mini jog with short stride length (05/01/2019), (05/26/2019); Able to jog about 5 min except for this past Monday (07/08/2019); Pt states being able to jog, but does not know if it is painless (09/01/2019); able to jog at treadmil speed 4.5 normal stride length, no pain (11/04/2019)    Time 6    Period Weeks    Status Achieved      PT LONG TERM GOAL #4   Title Pt will be able to perform R SLR hip flexion 5x independently as a demonstration of improved strength.    Baseline Unable to perform one full repetition without posterior pelvic tilt assist from pillow. (12/1/201); unable to perform (04/26/2020)    Time 6    Period Weeks    Status On-going    Target Date 06/10/20                 Plan - 05/18/20 1036    Clinical Impression Statement Continued working on gentle core and glute strength to promote function. Also worked on hip flexion concentric contraction with assist with greater ranges to promote ability to raise her R thigh up. Still demonstrates significant R hip flexion weakness. Pt tolerated session well without aggravation of symptoms. Pt will benefit from continued skilled physical therapy services to decrease pain, improve strength and function.    Personal Factors and Comorbidities Comorbidity 1    Comorbidities hx of C-section    Examination-Activity Limitations Squat    Stability/Clinical Decision Making Stable/Uncomplicated    Clinical Decision Making Low    Rehab Potential Good    PT Frequency 1x / week    PT Duration 8 weeks    PT Treatment/Interventions Therapeutic activities;Therapeutic exercise;Neuromuscular  re-education;Patient/family education;Manual techniques;Dry needling;Spinal Manipulations;Joint Manipulations;Aquatic Therapy;Electrical Stimulation;Iontophoresis 53m/ml Dexamethasone;Ultrasound    PT Next Visit Plan isometric, then concentric, then eccentric hip and abdominal strengthening, manual techniques, modalities PRN    PT Home Exercise Plan Medbridge Access Code: [email or web address]    Consulted and Agree with Plan of Care Patient           Patient will benefit from skilled therapeutic intervention in order to improve the following deficits and impairments:  Pain,Improper body mechanics,Postural dysfunction,Decreased strength  Visit Diagnosis: Pain in right hip  Muscle weakness (generalized)     Problem List Patient Active Problem List   Diagnosis Date Noted  . 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 S/P cesarean section - triplets 11/29 02/17/2012  . Postpartum care following cesarean delivery (11/29) 02/17/2012  . Rh negative, maternal 02/17/2012  . 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 Routine general medical examination at a health care facility 06/23/2010  . FIBROCYSTIC BREAST DISEASE 07/17/2008  . Asthma, 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   MJoneen BoersPT, DPT  05/18/2020, 11:38 AM  [facility] [address] [place] [id number]: [phone]  Fax:  [phone] Name: Nicole BENCOSMEMRN: [id number] of Birth: 9Oct 29, 1982

## 2020-05-20 ENCOUNTER — Other Ambulatory Visit: Payer: Self-pay

## 2020-05-20 ENCOUNTER — Ambulatory Visit: Payer: Managed Care, Other (non HMO)

## 2020-05-20 DIAGNOSIS — M6281 Muscle weakness (generalized): Secondary | ICD-10-CM

## 2020-05-20 DIAGNOSIS — M25551 Pain in right hip: Secondary | ICD-10-CM

## 2020-05-20 NOTE — Therapy (Signed)
[facility] [address], [place], [address] Phone: [phone]   Fax:  [phone]  Physical Therapy Treatment  Patient Details  Name: Nicole Owens MRN: [id number] Date of Birth: [birth year] Referring Provider (PT): Lynne Leader, MD   Encounter Date: 05/20/2020   PT End of Session - 05/20/20 0947    Visit Number 60    Number of Visits 20    Date for PT Re-Evaluation 06/10/20    PT Start Time 0947    PT Stop Time 1029    PT Time Calculation (min) 42 min    Activity Tolerance Patient tolerated treatment well    Behavior During Therapy [facility] for tasks assessed/performed           Past Medical History:  Diagnosis Date  . Asthma   . Asthma, intermittent    mild  . Carpal tunnel syndrome on both sides    with pregnancy  . Female infertility 04/30/2010   history restored after error with record merge  . Heartburn in pregnancy    nausea  . 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 Postpartum care following cesarean delivery 02/17/2012  . Rh negative, maternal 02/17/2012  . S/P cesarean section - triplets 11/29 02/17/2012    Past Surgical History:  Procedure Laterality Date  . CESAREAN SECTION  02/16/2012   Procedure: CESAREAN SECTION;  Surgeon: Lovenia Kim, MD;  Location: [facility];  Service: Obstetrics;  Laterality: N/A;  . NO PAST SURGERIES    . WISDOM TOOTH EXTRACTION      There were no vitals filed for this visit.   Subjective Assessment - 05/20/20 0949    Subjective Went to the gym and it did not hurt. R hip has not been hurting like it was.    Pertinent History R hip pain. Pain began mid November 2020. Pt was at the gym. Pt was performing scissor kicks. Felt a little pain more than normal. Went for a backward lunge with R LE and felt sharp pain R lower abdomen. Took 10 days off and iced that area. Did not really get better. Was diagnosed with sports hernia. Still getting pain but getting a  little better. Could not jog before 6 weeks ago. Thinks she can jog slowly now. Using her R LE to stand up from a R half kneeling position bothers her. Pt goes to the gym 6 days a week before, no goes to the gym 1 time a week and performs modified upper body work out. Pivoting the wrong way sometimes would hurt. Going up the steps 2 at a time bothers her R hip. Getting out of bed involving SLR hip flexion movement would bother it. Coughing, sneezing would bother R hip. Had C section 7 years ago for triplets. The pain is along the line of the incision.    Currently in Pain? No/denies    Pain Score 0-No pain                                     PT Education - 05/20/20 0950    Education Details ther-ex    Person(s) Educated Patient    Methods Explanation;Demonstration;Tactile cues;Verbal cues    Comprehension Returned demonstration;Verbalized understanding           Objectives  No latex allergies     MedbridgeAccess Code: [email or web address]  Pt wants to work up to  being able to do planks, pushups, sit ups, spider and mountain climbers, jumps   Hernia surgery scheduled for3/31/2022, followed by hip.     Therapeutic exercise with strap around abdomen:  Seated R hip flexion with PT assist from elevated mat table 5x3  Seated hip adduction ball squeeze 10x10 seconds for 2 sets.   Seated R hip extension isometrics 10x5 seconds for 3 sets   Pallof press Green band  R 5x5 seconds. Uncomfortable, eases with rest. Exercise stopped.   Standing B shoulder extension green band 10x5 seconds   R S/L R hip adduction 5x3  Supine heel slides with slider board with transversus abdominis contraction.   R 5x2   Supine hip fallouts 10x2 each LE    Improved exercise technique, movement at target joints, use of target muscles aftermin tomod verbal, visual, tactile cues.    Response to treatment Pt tolerated session well without aggravation of  symptoms.  Clinical presentation Continued working on transversus abdominis muscle strengthening as well as R hip strengthening to promote strength and healing. Pt tolerated session well without aggravation of symptoms. Decreasing overall starting R hip pain levels since restarting PT on 04/26/2020. Pt tolerated session well without aggravatio of symptoms. Pt will benefit from continued skilled physical therapy services to decrease pain, improve strength and function.      PT Short Term Goals - 05/01/19 1149      PT SHORT TERM GOAL #1   Title Patient will be independent with her HEP to decrease pain, improve strength and function.    Baseline Pt has started her HEP (03/19/2019); Pt demonstrates independence with her HEP (05/01/2019)    Time 3    Period Weeks    Status Achieved    Target Date 04/10/19             PT Long Term Goals - 04/26/20 1604      PT LONG TERM GOAL #1   Title Pt will have a decrease in R hip pain to 2/10 or less at worst to promote ability to run, perform exercises, stand up from a half kneeling position more comfortably.    Baseline 6/10 R hip pain at most (03/19/2019); 2-3/10 at most for the past 7 days (05/01/2019); 2-3/10 at most for the past 7 days (05/12/19), (05/26/2019); 3/10 (07/08/2019); 4/10 at most for the past 7 days (after workout at her gym and softball)  2-3/10 at most for the past 7 days. (11/04/2019); 4/10  R hip pain at most for the past 7 days (12/31/2019); 2/10 at most for the past 7 days (02/18/2020); 5/10 at worst for the past 7 days (04/26/2020)    Time 6    Period Weeks    Status On-going    Target Date 06/10/20      PT LONG TERM GOAL #2   Title Patient will improve R hip flexion, extension, abduction, and adduction strength by at least 1/2 MMT grade to promote ability to perform functional tasks more comfortably.    Baseline improved by 1/2 MMT grade but pain with hip adduction (05/26/2019); maintained except hip extension (07/08/2019);  impmroved strength except hip extension (11/04/2019); improved strength at all target planes (12/31/2019); Decreased strength (04/26/2020)    Time 8    Period Weeks    Status Partially Met    Target Date 06/10/20      PT LONG TERM GOAL #3   Title Pt will be able to jog for at least 5 minutes without R hip pain  to promote ability to participate in exercise and promote physical health    Baseline increased R hip pain when jogging or running (03/19/2019); able to perform mini jog with short stride length (05/01/2019), (05/26/2019); Able to jog about 5 min except for this past Monday (07/08/2019); Pt states being able to jog, but does not know if it is painless (09/01/2019); able to jog at treadmil speed 4.5 normal stride length, no pain (11/04/2019)    Time 6    Period Weeks    Status Achieved      PT LONG TERM GOAL #4   Title Pt will be able to perform R SLR hip flexion 5x independently as a demonstration of improved strength.    Baseline Unable to perform one full repetition without posterior pelvic tilt assist from pillow. (12/1/201); unable to perform (04/26/2020)    Time 6    Period Weeks    Status On-going    Target Date 06/10/20                 Plan - 05/20/20 1003    Clinical Impression Statement Continued working on transversus abdominis muscle strengthening as well as R hip strengthening to promote strength and healing. Pt tolerated session well without aggravation of symptoms. Decreasing overall starting R hip pain levels since restarting PT on 04/26/2020. Pt tolerated session well without aggravatio of symptoms. Pt will benefit from continued skilled physical therapy services to decrease pain, improve strength and function.    Personal Factors and Comorbidities Comorbidity 1    Comorbidities hx of C-section    Examination-Activity Limitations Squat    Stability/Clinical Decision Making Stable/Uncomplicated    Clinical Decision Making Low    Rehab Potential Good    PT Frequency 1x /  week    PT Duration 8 weeks    PT Treatment/Interventions Therapeutic activities;Therapeutic exercise;Neuromuscular re-education;Patient/family education;Manual techniques;Dry needling;Spinal Manipulations;Joint Manipulations;Aquatic Therapy;Electrical Stimulation;Iontophoresis 80m/ml Dexamethasone;Ultrasound    PT Next Visit Plan isometric, then concentric, then eccentric hip and abdominal strengthening, manual techniques, modalities PRN    PT Home Exercise Plan Medbridge Access Code: [email or web address]    Consulted and Agree with Plan of Care Patient           Patient will benefit from skilled therapeutic intervention in order to improve the following deficits and impairments:  Pain,Improper body mechanics,Postural dysfunction,Decreased strength  Visit Diagnosis: Pain in right hip  Muscle weakness (generalized)     Problem List Patient Active Problem List   Diagnosis Date Noted  . 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 S/P cesarean section - triplets 11/29 02/17/2012  . Postpartum care following cesarean delivery (11/29) 02/17/2012  . Rh negative, maternal 02/17/2012  . 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 Routine general medical examination at a health care facility 06/23/2010  . FIBROCYSTIC BREAST DISEASE 07/17/2008  . Asthma, 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    MJoneen BoersPT, DPT   05/20/2020, 12:01 PM  [facility] [address] [place] [address]Phone: [phone]  Fax:  [phone] Name: Nicole GILLOOLYMRN: [id number] of Birth: [birth year]

## 2020-05-25 ENCOUNTER — Other Ambulatory Visit: Payer: Self-pay

## 2020-05-25 ENCOUNTER — Ambulatory Visit: Payer: Managed Care, Other (non HMO)

## 2020-05-25 DIAGNOSIS — M6281 Muscle weakness (generalized): Secondary | ICD-10-CM

## 2020-05-25 DIAGNOSIS — M25551 Pain in right hip: Secondary | ICD-10-CM | POA: Diagnosis not present

## 2020-05-25 NOTE — Therapy (Signed)
[facility] [address], [place], [address] Phone: [phone]   Fax:  [phone]  Physical Therapy Treatment  Patient Details  Name: Nicole Owens MRN: [id number] Date of Birth: March 25, 1980 Referring Provider (PT): Lynne Leader, MD   Encounter Date: 05/25/2020   PT End of Session - 05/25/20 0806    Visit Number 61    Number of Visits 71    Date for PT Re-Evaluation 06/10/20    PT Start Time 0801    PT Stop Time 0840    PT Time Calculation (min) 39 min    Equipment Utilized During Treatment Gait belt    Activity Tolerance Patient tolerated treatment well;No increased pain    Behavior During Therapy WFL for tasks assessed/performed           Past Medical History:  Diagnosis Date  . Asthma   . Asthma, intermittent    mild  . Carpal tunnel syndrome on both sides    with pregnancy  . Female infertility 04/30/2010   history restored after error with record merge  . Heartburn in pregnancy    nausea  . 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 Postpartum care following cesarean delivery 02/17/2012  . Rh negative, maternal 02/17/2012  . S/P cesarean section - triplets 11/29 02/17/2012    Past Surgical History:  Procedure Laterality Date  . CESAREAN SECTION  02/16/2012   Procedure: CESAREAN SECTION;  Surgeon: Lovenia Kim, MD;  Location: [facility];  Service: Obstetrics;  Laterality: N/A;  . NO PAST SURGERIES    . WISDOM TOOTH EXTRACTION      There were no vitals filed for this visit.   Subjective Assessment - 05/25/20 0802    Subjective No updates this visit. Intermittent mild pain at gym with transitional movements. Pt reports no med changes, no MD visits. Reports HEP is going well.    Pertinent History R hip pain. Pain began mid November 2020. Pt was at the gym. Pt was performing scissor kicks. Felt a little pain more than normal. Went for a backward lunge with R LE and felt sharp pain R lower  abdomen. Took 10 days off and iced that area. Did not really get better. Was diagnosed with sports hernia. Still getting pain but getting a little better. Could not jog before 6 weeks ago. Thinks she can jog slowly now. Using her R LE to stand up from a R half kneeling position bothers her. Pt goes to the gym 6 days a week before, no goes to the gym 1 time a week and performs modified upper body work out. Pivoting the wrong way sometimes would hurt. Going up the steps 2 at a time bothers her R hip. Getting out of bed involving SLR hip flexion movement would bother it. Coughing, sneezing would bother R hip. Had C section 7 years ago for triplets. The pain is along the line of the incision.    Currently in Pain? No/denies          INTERVENTION THIS DATE: -seated Right hip flexion 2x10x3secH  -seated hip adduction ball squeeze 2x10x5sec -hooklying isometric clam c belt 2x10x5secH  -quadruped knee lift off 8x5secH bilat  -Hooklying single LE isometric in 90* hip flexion 8x10secH bilat  -hooklying bridge 2x10  - dead bug variation (synchronous BUE with ball extension/return 2x15), then rotation (to mimic palloff press) -farmers carry 2x120f c 10lb each hand  -lat bar shoulder extension 2x10 @ 20lb, cues  for upright posture at end range  -RDL 10lb free weight, cues for bent knee version (good form, back control)  -Chair squats with arms in outstretched 1x10       PT Short Term Goals - 05/01/19 1149      PT SHORT TERM GOAL #1   Title Patient will be independent with her HEP to decrease pain, improve strength and function.    Baseline Pt has started her HEP (03/19/2019); Pt demonstrates independence with her HEP (05/01/2019)    Time 3    Period Weeks    Status Achieved    Target Date 04/10/19             PT Long Term Goals - 04/26/20 1604      PT LONG TERM GOAL #1   Title Pt will have a decrease in R hip pain to 2/10 or less at worst to promote ability to run, perform exercises,  stand up from a half kneeling position more comfortably.    Baseline 6/10 R hip pain at most (03/19/2019); 2-3/10 at most for the past 7 days (05/01/2019); 2-3/10 at most for the past 7 days (05/12/19), (05/26/2019); 3/10 (07/08/2019); 4/10 at most for the past 7 days (after workout at her gym and softball)  2-3/10 at most for the past 7 days. (11/04/2019); 4/10  R hip pain at most for the past 7 days (12/31/2019); 2/10 at most for the past 7 days (02/18/2020); 5/10 at worst for the past 7 days (04/26/2020)    Time 6    Period Weeks    Status On-going    Target Date 06/10/20      PT LONG TERM GOAL #2   Title Patient will improve R hip flexion, extension, abduction, and adduction strength by at least 1/2 MMT grade to promote ability to perform functional tasks more comfortably.    Baseline improved by 1/2 MMT grade but pain with hip adduction (05/26/2019); maintained except hip extension (07/08/2019); impmroved strength except hip extension (11/04/2019); improved strength at all target planes (12/31/2019); Decreased strength (04/26/2020)    Time 8    Period Weeks    Status Partially Met    Target Date 06/10/20      PT LONG TERM GOAL #3   Title Pt will be able to jog for at least 5 minutes without R hip pain to promote ability to participate in exercise and promote physical health    Baseline increased R hip pain when jogging or running (03/19/2019); able to perform mini jog with short stride length (05/01/2019), (05/26/2019); Able to jog about 5 min except for this past Monday (07/08/2019); Pt states being able to jog, but does not know if it is painless (09/01/2019); able to jog at treadmil speed 4.5 normal stride length, no pain (11/04/2019)    Time 6    Period Weeks    Status Achieved      PT LONG TERM GOAL #4   Title Pt will be able to perform R SLR hip flexion 5x independently as a demonstration of improved strength.    Baseline Unable to perform one full repetition without posterior pelvic tilt assist from  pillow. (12/1/201); unable to perform (04/26/2020)    Time 6    Period Weeks    Status On-going    Target Date 06/10/20                 Plan - 05/25/20 0807    Clinical Impression Statement Continued with current plan of  care as laid out in evaluation and recent prior sessions. Pt remains motivated to advance progress toward goals. Rest breaks provided as needed, pt quick to ask when needed. Author maintains all interventions within appropriate level of intensity as not to purposefully exacerbate pain. Pt does require varying levels of assistance and cuing for completion of exercises for correct form and sometimes due to pain/weakness. No updates to HEP this date.    Personal Factors and Comorbidities Comorbidity 1    Comorbidities hx of C-section    Examination-Activity Limitations Squat    Stability/Clinical Decision Making Stable/Uncomplicated    Clinical Decision Making Low    Rehab Potential Good    PT Frequency 1x / week    PT Duration 8 weeks    PT Treatment/Interventions Therapeutic activities;Therapeutic exercise;Neuromuscular re-education;Patient/family education;Manual techniques;Dry needling;Spinal Manipulations;Joint Manipulations;Aquatic Therapy;Electrical Stimulation;Iontophoresis 31m/ml Dexamethasone;Ultrasound    PT Next Visit Plan Continue with plan of care- hip and core strengthening    PT Home Exercise Plan Medbridge Access Code: [email or web address]    Consulted and Agree with Plan of Care Patient           Patient will benefit from skilled therapeutic intervention in order to improve the following deficits and impairments:  Pain,Improper body mechanics,Postural dysfunction,Decreased strength  Visit Diagnosis: Pain in right hip  Muscle weakness (generalized)     Problem List Patient Active Problem List   Diagnosis Date Noted  . 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 S/P cesarean section - triplets 11/29 02/17/2012  . Postpartum care following cesarean  delivery (11/29) 02/17/2012  . Rh negative, maternal 02/17/2012  . 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 Routine general medical examination at a health care facility 06/23/2010  . FIBROCYSTIC BREAST DISEASE 07/17/2008  . Asthma, 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   8:41 AM, 05/25/20 AEtta Grandchild PT, DPT Physical Therapist - [phone](Office)    Milissa Fesperman C 05/25/2020, 8:14 AM  [facility] [address] [place] [address]Phone: [phone]  Fax:  [phone] Name: NROMA BONDARMRN: [id number] of Birth: [birth year]

## 2020-06-01 ENCOUNTER — Ambulatory Visit: Payer: Managed Care, Other (non HMO)

## 2020-06-01 ENCOUNTER — Other Ambulatory Visit: Payer: Self-pay

## 2020-06-01 DIAGNOSIS — M25551 Pain in right hip: Secondary | ICD-10-CM | POA: Diagnosis not present

## 2020-06-01 DIAGNOSIS — M6281 Muscle weakness (generalized): Secondary | ICD-10-CM

## 2020-06-01 NOTE — Therapy (Signed)
[facility] [address], [place], [address] Phone: [phone]   Fax:  [phone]  Physical Therapy Treatment  Patient Details  Name: Nicole Owens MRN: [id number] Date of Birth: January 18, 1981 Referring Provider (PT): Lynne Leader, MD   Encounter Date: 06/01/2020   PT End of Session - 06/01/20 0809    Visit Number 62    Number of Visits 71    Date for PT Re-Evaluation 06/10/20    PT Start Time 0809    PT Stop Time 0838    PT Time Calculation (min) 29 min    Activity Tolerance Patient tolerated treatment well;No increased pain    Behavior During Therapy WFL for tasks assessed/performed           Past Medical History:  Diagnosis Date  . Asthma   . Asthma, intermittent    mild  . Carpal tunnel syndrome on both sides    with pregnancy  . Female infertility 04/30/2010   history restored after error with record merge  . Heartburn in pregnancy    nausea  . 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 Postpartum care following cesarean delivery 02/17/2012  . Rh negative, maternal 02/17/2012  . S/P cesarean section - triplets 11/29 02/17/2012    Past Surgical History:  Procedure Laterality Date  . CESAREAN SECTION  02/16/2012   Procedure: CESAREAN SECTION;  Surgeon: Lovenia Kim, MD;  Location: [facility];  Service: Obstetrics;  Laterality: N/A;  . NO PAST SURGERIES    . WISDOM TOOTH EXTRACTION      There were no vitals filed for this visit.   Subjective Assessment - 06/01/20 0809    Subjective 2/10 R hip currently. Was ok after last session. Symptoms are steady.    Pertinent History R hip pain. Pain began mid November 2020. Pt was at the gym. Pt was performing scissor kicks. Felt a little pain more than normal. Went for a backward lunge with R LE and felt sharp pain R lower abdomen. Took 10 days off and iced that area. Did not really get better. Was diagnosed with sports hernia. Still getting pain but  getting a little better. Could not jog before 6 weeks ago. Thinks she can jog slowly now. Using her R LE to stand up from a R half kneeling position bothers her. Pt goes to the gym 6 days a week before, no goes to the gym 1 time a week and performs modified upper body work out. Pivoting the wrong way sometimes would hurt. Going up the steps 2 at a time bothers her R hip. Getting out of bed involving SLR hip flexion movement would bother it. Coughing, sneezing would bother R hip. Had C section 7 years ago for triplets. The pain is along the line of the incision.    Currently in Pain? Yes    Pain Score 2                                      PT Education - 06/01/20 0816    Education Details ther-ex    Person(s) Educated Patient    Methods Explanation;Demonstration;Tactile cues;Verbal cues    Comprehension Returned demonstration;Verbalized understanding          Objectives  No latex allergies     MedbridgeAccess Code: [email or web address]  Pt wants to work up to being able to do planks,  pushups, sit ups, spider and mountain climbers, jumps   Hernia surgery scheduled for3/31/2022, followed by hip.     Therapeutic exercise with strap around abdomen:  Seated R hip flexionfrom elevated mat table 10x3  Able to perform independently.   Seated R hip extension isometrics 10x5 seconds for 3 sets   Supine hip fallouts 10x2 each LE   hooklying mini march with tranvsersus abdominis contraction  5x each LE easy  Bridge with mini march 5x2 each LE  Prone glute max extension   R 10x2  R S/L R hip adduction 5x3  Seated hip adduction ball squeeze 10x10 seconds for 2 sets.   Standing with R foot on slider   R hip flexion/extension 5x2  Pt able to perform today.     Improved exercise technique, movement at target joints, use of target muscles aftermin tomod verbal, visual, tactile cues.    Response to treatment Pt tolerated session  well without aggravation of symptoms.  Clinical presentation Improved ability to perform seated R hip flexion from elevated table, being able to perform 10x3 today without assist as well as standing R hip flexion/extension with R foot on slider while wearing abdominal binder. Improved muscle activation observed with use of abdominal binder/assist for better core activation suggesting medical necessity to help improve abdominal strength.  Continued working on hip strengthening and gentle trunk strengthening to maintain function and prepare for surgery. Pt tolerated session well without aggravation of symptoms. Pt will benefit from continued skilled physical therapy services to decrease pain, improve strength and function      PT Short Term Goals - 05/01/19 1149      PT SHORT TERM GOAL #1   Title Patient will be independent with her HEP to decrease pain, improve strength and function.    Baseline Pt has started her HEP (03/19/2019); Pt demonstrates independence with her HEP (05/01/2019)    Time 3    Period Weeks    Status Achieved    Target Date 04/10/19             PT Long Term Goals - 04/26/20 1604      PT LONG TERM GOAL #1   Title Pt will have a decrease in R hip pain to 2/10 or less at worst to promote ability to run, perform exercises, stand up from a half kneeling position more comfortably.    Baseline 6/10 R hip pain at most (03/19/2019); 2-3/10 at most for the past 7 days (05/01/2019); 2-3/10 at most for the past 7 days (05/12/19), (05/26/2019); 3/10 (07/08/2019); 4/10 at most for the past 7 days (after workout at her gym and softball)  2-3/10 at most for the past 7 days. (11/04/2019); 4/10  R hip pain at most for the past 7 days (12/31/2019); 2/10 at most for the past 7 days (02/18/2020); 5/10 at worst for the past 7 days (04/26/2020)    Time 6    Period Weeks    Status On-going    Target Date 06/10/20      PT LONG TERM GOAL #2   Title Patient will improve R hip flexion,  extension, abduction, and adduction strength by at least 1/2 MMT grade to promote ability to perform functional tasks more comfortably.    Baseline improved by 1/2 MMT grade but pain with hip adduction (05/26/2019); maintained except hip extension (07/08/2019); impmroved strength except hip extension (11/04/2019); improved strength at all target planes (12/31/2019); Decreased strength (04/26/2020)    Time 8    Period  Weeks    Status Partially Met    Target Date 06/10/20      PT LONG TERM GOAL #3   Title Pt will be able to jog for at least 5 minutes without R hip pain to promote ability to participate in exercise and promote physical health    Baseline increased R hip pain when jogging or running (03/19/2019); able to perform mini jog with short stride length (05/01/2019), (05/26/2019); Able to jog about 5 min except for this past Monday (07/08/2019); Pt states being able to jog, but does not know if it is painless (09/01/2019); able to jog at treadmil speed 4.5 normal stride length, no pain (11/04/2019)    Time 6    Period Weeks    Status Achieved      PT LONG TERM GOAL #4   Title Pt will be able to perform R SLR hip flexion 5x independently as a demonstration of improved strength.    Baseline Unable to perform one full repetition without posterior pelvic tilt assist from pillow. (12/1/201); unable to perform (04/26/2020)    Time 6    Period Weeks    Status On-going    Target Date 06/10/20                 Plan - 06/01/20 0815    Clinical Impression Statement Improved ability to perform seated R hip flexion from elevated table, being able to perform 10x3 today without assist as well as standing R hip flexion/extension with R foot on slider while wearing abdominal binder. Improved muscle activation observed with use of abdominal binder/assist for better core activation suggesting medical necessity to help improve abdominal strength.  Continued working on hip strengthening and gentle trunk  strengthening to maintain function and prepare for surgery. Pt tolerated session well without aggravation of symptoms. Pt will benefit from continued skilled physical therapy services to decrease pain, improve strength and function    Personal Factors and Comorbidities Comorbidity 1    Comorbidities hx of C-section    Examination-Activity Limitations Squat    Stability/Clinical Decision Making Stable/Uncomplicated    Clinical Decision Making Low    Rehab Potential Good    PT Frequency 1x / week    PT Duration 8 weeks    PT Treatment/Interventions Therapeutic activities;Therapeutic exercise;Neuromuscular re-education;Patient/family education;Manual techniques;Dry needling;Spinal Manipulations;Joint Manipulations;Aquatic Therapy;Electrical Stimulation;Iontophoresis 72m/ml Dexamethasone;Ultrasound    PT Next Visit Plan Continue with plan of care- hip and core strengthening    PT Home Exercise Plan Medbridge Access Code: [email or web address]    Consulted and Agree with Plan of Care Patient           Patient will benefit from skilled therapeutic intervention in order to improve the following deficits and impairments:  Pain,Improper body mechanics,Postural dysfunction,Decreased strength  Visit Diagnosis: Pain in right hip  Muscle weakness (generalized)     Problem List Patient Active Problem List   Diagnosis Date Noted  . 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 S/P cesarean section - triplets 11/29 02/17/2012  . Postpartum care following cesarean delivery (11/29) 02/17/2012  . Rh negative, maternal 02/17/2012  . 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 Routine general medical examination at a health care facility 06/23/2010  . FIBROCYSTIC BREAST DISEASE 07/17/2008  . Asthma, 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    MJoneen BoersPT, DPT   06/01/2020, 7:47 PM  [facility] [address] [place] [address]Phone: [phone]   Fax:  [phone]  Name: Nicole Owens MRN: [id number] Date of Birth: [birth year]

## 2020-06-03 ENCOUNTER — Other Ambulatory Visit: Payer: Self-pay

## 2020-06-03 ENCOUNTER — Ambulatory Visit: Payer: Managed Care, Other (non HMO)

## 2020-06-03 DIAGNOSIS — M6281 Muscle weakness (generalized): Secondary | ICD-10-CM

## 2020-06-03 DIAGNOSIS — M25551 Pain in right hip: Secondary | ICD-10-CM

## 2020-06-03 NOTE — Therapy (Signed)
[facility] [address], [place], [address] Phone: [phone]   Fax:  [phone]  Physical Therapy Treatment  Patient Details  Name: Nicole Owens MRN: [id number] Date of Birth: 02/23/1981 Referring Provider (PT): Lynne Leader, MD   Encounter Date: 06/03/2020   PT End of Session - 06/03/20 0811    Visit Number 63    Number of Visits 71    Date for PT Re-Evaluation 06/10/20    PT Start Time 0811    PT Stop Time 0846    PT Time Calculation (min) 35 min    Activity Tolerance Patient tolerated treatment well;No increased pain    Behavior During Therapy WFL for tasks assessed/performed           Past Medical History:  Diagnosis Date  . Asthma   . Asthma, intermittent    mild  . Carpal tunnel syndrome on both sides    with pregnancy  . Female infertility 04/30/2010   history restored after error with record merge  . Heartburn in pregnancy    nausea  . 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 Postpartum care following cesarean delivery 02/17/2012  . Rh negative, maternal 02/17/2012  . S/P cesarean section - triplets 11/29 02/17/2012    Past Surgical History:  Procedure Laterality Date  . CESAREAN SECTION  02/16/2012   Procedure: CESAREAN SECTION;  Surgeon: Lovenia Kim, MD;  Location: [facility];  Service: Obstetrics;  Laterality: N/A;  . NO PAST SURGERIES    . WISDOM TOOTH EXTRACTION      There were no vitals filed for this visit.   Subjective Assessment - 06/03/20 0813    Subjective R hip is close to a 4/10. Pt was doing a lot of hip flexion looking for boxes at home. Did a lot of agility at the gym this morning. Also had to work a lot yesterday.    Pertinent History R hip pain. Pain began mid November 2020. Pt was at the gym. Pt was performing scissor kicks. Felt a little pain more than normal. Went for a backward lunge with R LE and felt sharp pain R lower abdomen. Took 10 days off and  iced that area. Did not really get better. Was diagnosed with sports hernia. Still getting pain but getting a little better. Could not jog before 6 weeks ago. Thinks she can jog slowly now. Using her R LE to stand up from a R half kneeling position bothers her. Pt goes to the gym 6 days a week before, no goes to the gym 1 time a week and performs modified upper body work out. Pivoting the wrong way sometimes would hurt. Going up the steps 2 at a time bothers her R hip. Getting out of bed involving SLR hip flexion movement would bother it. Coughing, sneezing would bother R hip. Had C section 7 years ago for triplets. The pain is along the line of the incision.    Currently in Pain? Yes    Pain Score 4                                      PT Education - 06/03/20 0820    Education Details ther-ex    Person(s) Educated Patient    Methods Explanation;Demonstration;Tactile cues;Verbal cues    Comprehension Returned demonstration;Verbalized understanding  Objectives  No latex allergies     MedbridgeAccess Code: [email or web address]  Pt wants to work up to being able to do planks, pushups, sit ups, spider and mountain climbers, jumps   Hernia surgery scheduled for3/31/2022, followed by hip.     Therapeutic exercise with strap around abdomen:  Seated R hip flexionfrom elevated mat table 10x2             Able to perform independently.   Seated R hip flexion isometrics from elevated mat table 10x5 seconds  comfortable level of effort.    Seated hip adduction ball squeeze 10x10 secondsfor 2 sets.  Comfortable level of effort.   Bridge with hip adduction ball squeeze 10x5 seconds    Then bridge without ball squeeze 10x5 seconds    Prone glute max extension              R 10x2   NuStep seat 6, arms 6, level 1 x 5 minutes for genlte movement at the R hip.   Decreased R hip pain  Static mini lunge R LE 10x  Seated R hip extension  isometrics 10x10 seconds for 2 sets  Seated R knee flexion isometrics/heel digs 10x5 seconds for 2 sets   No hip pain after last 4 exercises   Improved exercise technique, movement at target joints, use of target muscles aftermin tomod verbal, visual, tactile cues.    Response to treatment No pain after session.   Clinical presentation Decreased R hip pain with gentle movement. Continued working on hip strength, and movement to maintain progress and in preparation for her surgery. Pt tolerated session well without aggravation of symptoms. Pt will benefit from continued skilled physical therapy services to decreae pain, improve strength and function.         PT Short Term Goals - 05/01/19 1149      PT SHORT TERM GOAL #1   Title Patient will be independent with her HEP to decrease pain, improve strength and function.    Baseline Pt has started her HEP (03/19/2019); Pt demonstrates independence with her HEP (05/01/2019)    Time 3    Period Weeks    Status Achieved    Target Date 04/10/19             PT Long Term Goals - 04/26/20 1604      PT LONG TERM GOAL #1   Title Pt will have a decrease in R hip pain to 2/10 or less at worst to promote ability to run, perform exercises, stand up from a half kneeling position more comfortably.    Baseline 6/10 R hip pain at most (03/19/2019); 2-3/10 at most for the past 7 days (05/01/2019); 2-3/10 at most for the past 7 days (05/12/19), (05/26/2019); 3/10 (07/08/2019); 4/10 at most for the past 7 days (after workout at her gym and softball)  2-3/10 at most for the past 7 days. (11/04/2019); 4/10  R hip pain at most for the past 7 days (12/31/2019); 2/10 at most for the past 7 days (02/18/2020); 5/10 at worst for the past 7 days (04/26/2020)    Time 6    Period Weeks    Status On-going    Target Date 06/10/20      PT LONG TERM GOAL #2   Title Patient will improve R hip flexion, extension, abduction, and adduction strength by at least 1/2  MMT grade to promote ability to perform functional tasks more comfortably.    Baseline improved by 1/2 MMT grade but  pain with hip adduction (05/26/2019); maintained except hip extension (07/08/2019); impmroved strength except hip extension (11/04/2019); improved strength at all target planes (12/31/2019); Decreased strength (04/26/2020)    Time 8    Period Weeks    Status Partially Met    Target Date 06/10/20      PT LONG TERM GOAL #3   Title Pt will be able to jog for at least 5 minutes without R hip pain to promote ability to participate in exercise and promote physical health    Baseline increased R hip pain when jogging or running (03/19/2019); able to perform mini jog with short stride length (05/01/2019), (05/26/2019); Able to jog about 5 min except for this past Monday (07/08/2019); Pt states being able to jog, but does not know if it is painless (09/01/2019); able to jog at treadmil speed 4.5 normal stride length, no pain (11/04/2019)    Time 6    Period Weeks    Status Achieved      PT LONG TERM GOAL #4   Title Pt will be able to perform R SLR hip flexion 5x independently as a demonstration of improved strength.    Baseline Unable to perform one full repetition without posterior pelvic tilt assist from pillow. (12/1/201); unable to perform (04/26/2020)    Time 6    Period Weeks    Status On-going    Target Date 06/10/20                 Plan - 06/03/20 0820    Clinical Impression Statement Decreased R hip pain with gentle movement. Continued working on hip strength, and movement to maintain progress and in preparation for her surgery. Pt tolerated session well without aggravation of symptoms. Pt will benefit from continued skilled physical therapy services to decreae pain, improve strength and function.    Personal Factors and Comorbidities Comorbidity 1    Comorbidities hx of C-section    Examination-Activity Limitations Squat    Stability/Clinical Decision Making Stable/Uncomplicated     Clinical Decision Making Low    Rehab Potential Good    PT Frequency 1x / week    PT Duration 8 weeks    PT Treatment/Interventions Therapeutic activities;Therapeutic exercise;Neuromuscular re-education;Patient/family education;Manual techniques;Dry needling;Spinal Manipulations;Joint Manipulations;Aquatic Therapy;Electrical Stimulation;Iontophoresis 62m/ml Dexamethasone;Ultrasound    PT Next Visit Plan Continue with plan of care- hip and core strengthening    PT Home Exercise Plan Medbridge Access Code: [email or web address]    Consulted and Agree with Plan of Care Patient           Patient will benefit from skilled therapeutic intervention in order to improve the following deficits and impairments:  Pain,Improper body mechanics,Postural dysfunction,Decreased strength  Visit Diagnosis: Pain in right hip  Muscle weakness (generalized)     Problem List Patient Active Problem List   Diagnosis Date Noted  . 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 S/P cesarean section - triplets 11/29 02/17/2012  . Postpartum care following cesarean delivery (11/29) 02/17/2012  . Rh negative, maternal 02/17/2012  . 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 Routine general medical examination at a health care facility 06/23/2010  . FIBROCYSTIC BREAST DISEASE 07/17/2008  . Asthma, 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     MJoneen BoersPT, DPT   06/03/2020, 9:53 AM  [facility] [address] [place] [address]Phone: [phone]  Fax:  [phone] Name: Nicole STIFTERMRN: [id number] of Birth: [birth year]

## 2020-06-04 ENCOUNTER — Ambulatory Visit (INDEPENDENT_AMBULATORY_CARE_PROVIDER_SITE_OTHER): Payer: Managed Care, Other (non HMO) | Admitting: Surgical

## 2020-06-04 ENCOUNTER — Encounter: Payer: Self-pay | Admitting: Surgical

## 2020-06-04 ENCOUNTER — Other Ambulatory Visit: Payer: Self-pay

## 2020-06-04 VITALS — BP 157/96 | HR 80 | Ht 62.0 in | Wt 163.0 lb

## 2020-06-04 DIAGNOSIS — M6208 Separation of muscle (nontraumatic), other site: Secondary | ICD-10-CM

## 2020-06-04 MED ORDER — CEPHALEXIN 500 MG PO CAPS: 500.0000 mg | ORAL_CAPSULE | Freq: Four times a day (QID) | ORAL | 0 refills | Status: AC

## 2020-06-04 MED ORDER — HYDROCODONE-ACETAMINOPHEN 5-325 MG PO TABS: 1.0000 | ORAL_TABLET | Freq: Four times a day (QID) | ORAL | 0 refills | Status: AC | PRN

## 2020-06-04 MED ORDER — ONDANSETRON HCL 4 MG PO TABS: 4.0000 mg | ORAL_TABLET | Freq: Three times a day (TID) | ORAL | 0 refills | Status: DC | PRN

## 2020-06-04 NOTE — Progress Notes (Signed)
Patient ID: Nicole Owens, female    DOB: [birth year], 40 y.o.   MRN: [id number]  Chief Complaint  Patient presents with  . Pre-op Exam      ICD-10-CM   1. Diastasis of rectus abdominis  M62.08     History of Present Illness: Nicole Owens is a 40 y.o.  female  with a history of diastases rectus of abdominis.  She presents for preoperative evaluation for upcoming procedure, abdominoplasty with liposuction, scheduled for 06/17/2020 with Dr. Ulice Bold.  She is also scheduled for hernia repair by general surgery the same day prior to our surgery.  Patient to discuss hernia repair with general surgery separately.  No history of anesthesia. No history of DVT/PE.  No family history of DVT/PE.  No family or personal history of bleeding or clotting disorders.  Patient is not currently taking any blood thinners.  No history of CVA/MI.   Job: Chief Financial Officer for [employer]  PMH Significant for: Asthma  Patient initially was scheduled for her abdominoplasty 2 months ago, however she tested positive for Covid at that time.  We have previously completed a preoperative evaluation to discuss the surgery. Patient reports she is overall doing well after her COVID, she does have asthma and reports that she has been using her daily medications which have been helpful.  She reports that she is currently having some sinus issues, but this is improving.  Past Medical History: Allergies: Allergies  Allergen Reactions  . Aspirin Shortness Of Breath and Other (See Comments)    Can trigger asthma, told nurse ibuprofen makes her feel odd and only takes one 200mg  tablet if needed    Current Medications:  Current Outpatient Medications:  .  acetaminophen (TYLENOL) 325 MG tablet, Take 650 mg by mouth every 6 (six) hours as needed for moderate pain or headache., Disp: , Rfl:  .  albuterol (VENTOLIN HFA) 108 (90 Base) MCG/ACT inhaler, INHALE TWO PUFFS BY MOUTH THREE TIMES DAILY AS NEEDED FOR WHEEZING OR   SHORTNESS  OF  BREATH (Patient taking differently: Inhale 2 puffs into the lungs 3 (three) times daily as needed for wheezing or shortness of breath.), Disp: 18 g, Rfl: 3 .  budesonide-formoterol (SYMBICORT) 80-4.5 MCG/ACT inhaler, Inhale 2 puffs into the lungs 2 (two) times daily. (Patient taking differently: Inhale 1 puff into the lungs daily.), Disp: 30.6 g, Rfl: 3 .  cephALEXin (KEFLEX) 500 MG capsule, Take 1 capsule (500 mg total) by mouth 4 (four) times daily for 5 days., Disp: 20 capsule, Rfl: 0 .  HYDROcodone-acetaminophen (NORCO) 5-325 MG tablet, Take 1 tablet by mouth every 6 (six) hours as needed for up to 5 days for severe pain., Disp: 20 tablet, Rfl: 0 .  ondansetron (ZOFRAN) 4 MG tablet, Take 1 tablet (4 mg total) by mouth every 8 (eight) hours as needed for nausea or vomiting., Disp: 20 tablet, Rfl: 0  Past Medical Problems: Past Medical History:  Diagnosis Date  . Asthma   . Asthma, intermittent    mild  . Carpal tunnel syndrome on both sides    with pregnancy  . Female infertility 04/30/2010   history restored after error with record merge  . Heartburn in pregnancy    nausea  . 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 Postpartum care following cesarean delivery 02/17/2012  . Rh negative, maternal 02/17/2012  . S/P cesarean section - triplets 11/29 02/17/2012    Past Surgical History: Past Surgical History:  Procedure Laterality Date  .  CESAREAN SECTION  02/16/2012   Procedure: CESAREAN SECTION;  Surgeon: Lenoard Aden, MD;  Location: [facility];  Service: Obstetrics;  Laterality: N/A;  . NO PAST SURGERIES    . WISDOM TOOTH EXTRACTION      Social History: Social History   Socioeconomic History  . Marital status: Married    Spouse name: Jacquelin Krajewski  . Number of children: 0  . Years of education: Not on file  . Highest education level: Not on file  Occupational History  . Occupation: Personal assistant: [employer]  Tobacco Use  . Smoking status:  Never Smoker  . Smokeless tobacco: Never Used  Substance and Sexual Activity  . Alcohol use: Yes    Comment: social but none while pregnant  . Drug use: No  . Sexual activity: Yes    Birth control/protection: None    Comment: pregnant  Other Topics Concern  . Not on file  Social History Narrative   ** Merged History Encounter **       Social Determinants of Health   Financial Resource Strain: Not on file  Food Insecurity: Not on file  Transportation Needs: Not on file  Physical Activity: Not on file  Stress: Not on file  Social Connections: Not on file  Intimate Partner Violence: Not on file    Family History: Family History  Problem Relation Age of Onset  . Hypertension Father   . Obesity Sister     Review of Systems: Review of Systems  Constitutional: Negative.   Respiratory: Negative.   Cardiovascular: Negative.   Gastrointestinal: Negative.   Neurological: Negative.     Physical Exam: Vital Signs BP (!) 157/96 (BP Location: Right Arm, Patient Position: Sitting, Cuff Size: Normal)   Pulse 80   Ht 5\' 2"  (1.575 m)   Wt 163 lb (73.9 kg)   SpO2 99%   BMI 29.81 kg/m   Physical Exam Constitutional:      General: Not in acute distress.    Appearance: Normal appearance. Not ill-appearing.  HENT:     Head: Normocephalic and atraumatic.  Eyes:     Pupils: Pupils are equal, round Neck:     Musculoskeletal: Normal range of motion.  Cardiovascular:     Rate and Rhythm: Normal rate    Pulses: Normal pulses.  Pulmonary:     Effort: Pulmonary effort is normal. No respiratory distress.  Abdominal:     General: Abdomen is flat. There is no distension.     Palpations: Abdomen is soft.     Tenderness: There is no abdominal tenderness.  Musculoskeletal: Normal range of motion.  Skin:    General: Skin is warm and dry.     Findings: No erythema or rash.  Neurological:     General: No focal deficit present.     Mental Status: Alert and oriented to person,  place, and time. Mental status is at baseline.     Motor: No weakness.  Psychiatric:        Mood and Affect: Mood normal.        Behavior: Behavior normal.    Assessment/Plan: The patient is scheduled for abdominoplasty with liposuction with Dr. .  Risks, benefits, and alternatives of procedure discussed, questions answered and consent obtained.    Smoking Status: Non-smoker; Counseling Given?  N/A Last Mammogram: N/A  Caprini Score: 3, moderate; Risk Factors include: BMI greater than 25, and length of planned surgery. Recommendation for mechanical and pharmacological prophylaxis while hospitalized. Encourage early ambulation.  Pictures obtained: 02/06/2020  Post-op Rx sent to pharmacy: Norco, Zofran, Keflex  We discussed stopping any supplements prior to surgery.  Patient was provided with the General Surgical Risk consent document and Pain Medication Agreement prior to their appointment.  They had adequate time to read through the risk consent documents and Pain Medication Agreement. We also discussed them in person together during this preop appointment. All of their questions were answered to their satisfaction.  Recommended calling if they have any further questions.  Risk consent form and Pain Medication Agreement to be scanned into patient's chart.  The risk that can be encountered for this procedure were discussed and include the following but not limited to these: asymmetry, fluid accumulation, firmness of the tissue, skin loss, decrease or no sensation, fat necrosis, bleeding, infection, healing delay.  Deep vein thrombosis, cardiac and pulmonary complications are risks to any procedure.  There are risks of anesthesia, changes to skin sensation and injury to nerves or blood vessels.  The muscle can be temporarily or permanently injured.  You may have an allergic reaction to tape, suture, glue, blood products which can result in skin discoloration, swelling, pain, skin  lesions, poor healing.  Any of these can lead to the need for revisonal surgery or stage procedures.  Weight gain and weigh loss can also effect the long term appearance. The results are not guaranteed to last a lifetime.  Future surgery may be required.    The risks that can be encountered with and after liposuction were discussed and include the following but no limited to these:  Asymmetry, fluid accumulation, firmness of the area, fat necrosis with death of fat tissue, bleeding, infection, delayed healing, anesthesia risks, skin sensation changes, injury to structures including nerves, blood vessels, and muscles which may be temporary or permanent, allergies to tape, suture materials and glues, blood products, topical preparations or injected agents, skin and contour irregularities, skin discoloration and swelling, deep vein thrombosis, cardiac and pulmonary complications, pain, which may persist, persistent pain, recurrence of the lesion, poor healing of the incision, possible need for revisional surgery or staged procedures. Thiere can also be persistent swelling, poor wound healing, rippling or loose skin, worsening of cellulite, swelling, and thermal burn or heat injury from ultrasound with the ultrasound-assisted lipoplasty technique. Any change in weight fluctuations can alter the outcome.    Electronically signed by: Kermit Balo Innocence Schlotzhauer, PA-C 06/04/2020 9:51 AM

## 2020-06-08 ENCOUNTER — Ambulatory Visit: Payer: Managed Care, Other (non HMO)

## 2020-06-08 ENCOUNTER — Other Ambulatory Visit: Payer: Self-pay

## 2020-06-08 ENCOUNTER — Telehealth: Payer: Self-pay | Admitting: *Deleted

## 2020-06-08 DIAGNOSIS — M6281 Muscle weakness (generalized): Secondary | ICD-10-CM

## 2020-06-08 DIAGNOSIS — M25551 Pain in right hip: Secondary | ICD-10-CM

## 2020-06-08 MED ORDER — BUDESONIDE-FORMOTEROL FUMARATE 160-4.5 MCG/ACT IN AERO: 2.0000 | INHALATION_SPRAY | Freq: Two times a day (BID) | RESPIRATORY_TRACT | 6 refills | Status: DC

## 2020-06-08 NOTE — Telephone Encounter (Signed)
Called and spoke with patient, advised of change in dose of Symbicort from 80 to 160.  Advised that according to the computer, this medication is not on the formulary for her insurance and may require a PA.  Scheduled OV with TP at 10 am 10/24, to arrive at 9:45 am for check in as MR did not have any openings in the next 2 weeks and she goes into quarantine for her hernia surgery on Monday, surgery is on 06/17/20.  Patient has no problem coming to [facility] office.  Nothing further needed,.

## 2020-06-08 NOTE — Therapy (Signed)
[facility] [address], [place], [address] Phone: [phone]   Fax:  [phone]  Physical Therapy Treatment  Patient Details  Name: Nicole Owens MRN: [id number] Date of Birth: 1980-08-15 Referring Provider (PT): Lynne Leader, MD   Encounter Date: 06/08/2020   PT End of Session - 06/08/20 0808    Visit Number 64    Number of Visits 71    Date for PT Re-Evaluation 06/10/20    PT Start Time 0805    PT Stop Time 0845    PT Time Calculation (min) 40 min    Activity Tolerance Patient tolerated treatment well;No increased pain    Behavior During Therapy WFL for tasks assessed/performed           Past Medical History:  Diagnosis Date  . Asthma   . Asthma, intermittent    mild  . Carpal tunnel syndrome on both sides    with pregnancy  . Female infertility 04/30/2010   history restored after error with record merge  . Heartburn in pregnancy    nausea  . 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 Postpartum care following cesarean delivery 02/17/2012  . Rh negative, maternal 02/17/2012  . S/P cesarean section - triplets 11/29 02/17/2012    Past Surgical History:  Procedure Laterality Date  . CESAREAN SECTION  02/16/2012   Procedure: CESAREAN SECTION;  Surgeon: Lovenia Kim, MD;  Location: [facility];  Service: Obstetrics;  Laterality: N/A;  . NO PAST SURGERIES    . WISDOM TOOTH EXTRACTION      There were no vitals filed for this visit.   Subjective Assessment - 06/08/20 0809    Subjective No R hip pain currently. Bothered her after last session for a few days but it went away. Worked as well though. Works 6 hours tomorrow.    Pertinent History R hip pain. Pain began mid November 2020. Pt was at the gym. Pt was performing scissor kicks. Felt a little pain more than normal. Went for a backward lunge with R LE and felt sharp pain R lower abdomen. Took 10 days off and iced that area. Did not really  get better. Was diagnosed with sports hernia. Still getting pain but getting a little better. Could not jog before 6 weeks ago. Thinks she can jog slowly now. Using her R LE to stand up from a R half kneeling position bothers her. Pt goes to the gym 6 days a week before, no goes to the gym 1 time a week and performs modified upper body work out. Pivoting the wrong way sometimes would hurt. Going up the steps 2 at a time bothers her R hip. Getting out of bed involving SLR hip flexion movement would bother it. Coughing, sneezing would bother R hip. Had C section 7 years ago for triplets. The pain is along the line of the incision.    Currently in Pain? No/denies                                     PT Education - 06/08/20 0814    Education Details ther-ex    Person(s) Educated Patient    Methods Explanation;Demonstration;Tactile cues;Verbal cues    Comprehension Returned demonstration;Verbalized understanding          Objectives  No latex allergies     MedbridgeAccess Code: [email or web address]  Pt wants to work  up to being able to do planks, pushups, sit ups, spider and mountain climbers, jumps   Hernia surgery scheduled for3/31/2022, followed by hip.   Has a 15 lbs weight restriction for the abdomen after surgery.   The R hip surgery wont be until about 6-8 weeks after the abdomen surgery.      Therapeutic exercise   Seated R hip flexionfrom elevated mat table10x2  Supine hip fallouts 10x each LE with transversus abdominis contraction.    NuStep seat 6, arms 6, level 1 x 5 minutes for genlte movement at the R hip.              Decreased R hip pain  Standing pallof press red band with transversus abdominis contraction  R 10x5 seconds for 2 sets  L 10x5 seconds for 2 sets  Mini walking lunges   Forward 32 ft x 2  Side step while maintaining mini squat position 32 ft to the R and 32 ft to the L.    Bridge with hip  adduction ball squeeze 10x5 seconds   Bridge with mini march 5x each LE    Prone glute max extension  R 10x  Seated R hip extension isometrics 10x10 seconds for 2 sets  Seated hip adduction ball squeeze 10x10 secondsfor 2 sets. Comfortable level of effort.              Improved exercise technique, movement at target joints, use of target muscles aftermin tomod verbal, visual, tactile cues.    Response to treatment Pt tolerated session well without aggravation of symptoms. No hip pain after session. Just fatigue.   Clinical presentation Continued working on general strengthening in preparation for her surgery. Pt tolerated session well without aggravation of symptoms. Pt will benefit from continued skilled physical therapy services to improve strength and function.       PT Short Term Goals - 05/01/19 1149      PT SHORT TERM GOAL #1   Title Patient will be independent with her HEP to decrease pain, improve strength and function.    Baseline Pt has started her HEP (03/19/2019); Pt demonstrates independence with her HEP (05/01/2019)    Time 3    Period Weeks    Status Achieved    Target Date 04/10/19             PT Long Term Goals - 04/26/20 1604      PT LONG TERM GOAL #1   Title Pt will have a decrease in R hip pain to 2/10 or less at worst to promote ability to run, perform exercises, stand up from a half kneeling position more comfortably.    Baseline 6/10 R hip pain at most (03/19/2019); 2-3/10 at most for the past 7 days (05/01/2019); 2-3/10 at most for the past 7 days (05/12/19), (05/26/2019); 3/10 (07/08/2019); 4/10 at most for the past 7 days (after workout at her gym and softball)  2-3/10 at most for the past 7 days. (11/04/2019); 4/10  R hip pain at most for the past 7 days (12/31/2019); 2/10 at most for the past 7 days (02/18/2020); 5/10 at worst for the past 7 days (04/26/2020)    Time 6    Period Weeks    Status On-going    Target Date  06/10/20      PT LONG TERM GOAL #2   Title Patient will improve R hip flexion, extension, abduction, and adduction strength by at least 1/2 MMT grade to promote ability to perform  functional tasks more comfortably.    Baseline improved by 1/2 MMT grade but pain with hip adduction (05/26/2019); maintained except hip extension (07/08/2019); impmroved strength except hip extension (11/04/2019); improved strength at all target planes (12/31/2019); Decreased strength (04/26/2020)    Time 8    Period Weeks    Status Partially Met    Target Date 06/10/20      PT LONG TERM GOAL #3   Title Pt will be able to jog for at least 5 minutes without R hip pain to promote ability to participate in exercise and promote physical health    Baseline increased R hip pain when jogging or running (03/19/2019); able to perform mini jog with short stride length (05/01/2019), (05/26/2019); Able to jog about 5 min except for this past Monday (07/08/2019); Pt states being able to jog, but does not know if it is painless (09/01/2019); able to jog at treadmil speed 4.5 normal stride length, no pain (11/04/2019)    Time 6    Period Weeks    Status Achieved      PT LONG TERM GOAL #4   Title Pt will be able to perform R SLR hip flexion 5x independently as a demonstration of improved strength.    Baseline Unable to perform one full repetition without posterior pelvic tilt assist from pillow. (12/1/201); unable to perform (04/26/2020)    Time 6    Period Weeks    Status On-going    Target Date 06/10/20                 Plan - 06/08/20 0815    Clinical Impression Statement Continued working on general strengthening in preparation for her surgery. Pt tolerated session well without aggravation of symptoms. Pt will benefit from continued skilled physical therapy services to improve strength and function.    Personal Factors and Comorbidities Comorbidity 1    Comorbidities hx of C-section    Examination-Activity Limitations Squat     Stability/Clinical Decision Making Stable/Uncomplicated    Clinical Decision Making Low    Rehab Potential Good    PT Frequency 1x / week    PT Duration 8 weeks    PT Treatment/Interventions Therapeutic activities;Therapeutic exercise;Neuromuscular re-education;Patient/family education;Manual techniques;Dry needling;Spinal Manipulations;Joint Manipulations;Aquatic Therapy;Electrical Stimulation;Iontophoresis 46m/ml Dexamethasone;Ultrasound    PT Next Visit Plan Continue with plan of care- hip and core strengthening    PT Home Exercise Plan Medbridge Access Code: [email or web address]    Consulted and Agree with Plan of Care Patient           Patient will benefit from skilled therapeutic intervention in order to improve the following deficits and impairments:  Pain,Improper body mechanics,Postural dysfunction,Decreased strength  Visit Diagnosis: Pain in right hip  Muscle weakness (generalized)     Problem List Patient Active Problem List   Diagnosis Date Noted  . 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 S/P cesarean section - triplets 11/29 02/17/2012  . Postpartum care following cesarean delivery (11/29) 02/17/2012  . Rh negative, maternal 02/17/2012  . 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 Routine general medical examination at a health care facility 06/23/2010  . FIBROCYSTIC BREAST DISEASE 07/17/2008  . Asthma, 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    MJoneen BoersPT, DPT   06/08/2020, 12:54 PM  [facility] [address] [place] [id number]: [phone]  Fax:  [phone] Name: Nicole THIESMRN: [id number] of Birth: [birth year]

## 2020-06-08 NOTE — Telephone Encounter (Signed)
Dr. Marchelle Gearing, Please see patient comment regarding Symbicort inhaler and increase usage of albuterol inhaler.  Please advise.

## 2020-06-08 NOTE — Telephone Encounter (Signed)
Ok to go to higher dose of symbicort 160 , 2 puff bid but increased dyspnea post covid can be covid long haul, pulm inflammation etc., Recommend visit with app or me first available at her convenience

## 2020-06-10 ENCOUNTER — Ambulatory Visit: Payer: Managed Care, Other (non HMO)

## 2020-06-10 ENCOUNTER — Ambulatory Visit (INDEPENDENT_AMBULATORY_CARE_PROVIDER_SITE_OTHER): Payer: Managed Care, Other (non HMO)

## 2020-06-10 ENCOUNTER — Encounter: Payer: Self-pay | Admitting: Adult Health

## 2020-06-10 ENCOUNTER — Ambulatory Visit: Payer: Managed Care, Other (non HMO) | Admitting: Adult Health

## 2020-06-10 ENCOUNTER — Other Ambulatory Visit: Payer: Self-pay

## 2020-06-10 VITALS — BP 124/70 | HR 77 | Temp 97.6°F | Ht 62.0 in | Wt 164.8 lb

## 2020-06-10 DIAGNOSIS — J45909 Unspecified asthma, uncomplicated: Secondary | ICD-10-CM

## 2020-06-10 DIAGNOSIS — M25551 Pain in right hip: Secondary | ICD-10-CM | POA: Diagnosis not present

## 2020-06-10 DIAGNOSIS — M6281 Muscle weakness (generalized): Secondary | ICD-10-CM

## 2020-06-10 MED ORDER — PREDNISONE 20 MG PO TABS: 40.0000 mg | ORAL_TABLET | Freq: Every day | ORAL | 0 refills | Status: DC

## 2020-06-10 MED ORDER — ALBUTEROL SULFATE HFA 108 (90 BASE) MCG/ACT IN AERS: INHALATION_SPRAY | RESPIRATORY_TRACT | 3 refills | Status: DC

## 2020-06-10 NOTE — Therapy (Signed)
[facility] [address], [place], [address] Phone: [phone]   Fax:  [phone]  Physical Therapy Treatment  Patient Details  Name: Nicole Owens MRN: [id number] Date of Birth: 1980/04/20 Referring Provider (PT): Lynne Leader, MD   Encounter Date: 06/10/2020   PT End of Session - 06/10/20 0806    Visit Number 65    Number of Visits 71    Date for PT Re-Evaluation 06/10/20    PT Start Time 0806    PT Stop Time 3710    PT Time Calculation (min) 31 min    Activity Tolerance Patient tolerated treatment well;No increased pain    Behavior During Therapy WFL for tasks assessed/performed           Past Medical History:  Diagnosis Date  . Asthma   . Asthma, intermittent    mild  . Carpal tunnel syndrome on both sides    with pregnancy  . Female infertility 04/30/2010   history restored after error with record merge  . Heartburn in pregnancy    nausea  . 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 Postpartum care following cesarean delivery 02/17/2012  . Rh negative, maternal 02/17/2012  . S/P cesarean section - triplets 11/29 02/17/2012    Past Surgical History:  Procedure Laterality Date  . CESAREAN SECTION  02/16/2012   Procedure: CESAREAN SECTION;  Surgeon: Lovenia Kim, MD;  Location: [facility];  Service: Obstetrics;  Laterality: N/A;  . NO PAST SURGERIES    . WISDOM TOOTH EXTRACTION      There were no vitals filed for this visit.   Subjective Assessment - 06/10/20 0807    Subjective R hip is ok. It hurt off and on yesterday. Yesterday it was about a 4/10 at the gym even with the modifications of the exercises. Pain would come and go. 5-6/10 at most for the past 7 days (Friday and Saturday). Going to the pulmonologist after today's appointment.    Pertinent History R hip pain. Pain began mid November 2020. Pt was at the gym. Pt was performing scissor kicks. Felt a little pain more than  normal. Went for a backward lunge with R LE and felt sharp pain R lower abdomen. Took 10 days off and iced that area. Did not really get better. Was diagnosed with sports hernia. Still getting pain but getting a little better. Could not jog before 6 weeks ago. Thinks she can jog slowly now. Using her R LE to stand up from a R half kneeling position bothers her. Pt goes to the gym 6 days a week before, no goes to the gym 1 time a week and performs modified upper body work out. Pivoting the wrong way sometimes would hurt. Going up the steps 2 at a time bothers her R hip. Getting out of bed involving SLR hip flexion movement would bother it. Coughing, sneezing would bother R hip. Had C section 7 years ago for triplets. The pain is along the line of the incision.    Currently in Pain? No/denies    Pain Score 0-No pain              [facility] PT Assessment - 06/10/20 0810      Strength   Right Hip Flexion 4+/5    Right Hip Extension 3/5   with anterior hip pain R   Right Hip ABduction 5/5    Right Hip ADduction 3/5  PT Education - 06/10/20 0820    Education Details ther-ex    Person(s) Educated Patient    Methods Explanation;Demonstration;Tactile cues;Verbal cues    Comprehension Returned demonstration;Verbalized understanding          Objectives  No latex allergies     MedbridgeAccess Code: [email or web address]  Pt wants to work up to being able to do planks, pushups, sit ups, spider and mountain climbers, jumps   Hernia surgery scheduled for3/31/2022, followed by hip.   Has a 15 lbs weight restriction for the abdomen after surgery.   The R hip surgery wont be until about 6-8 weeks after the abdomen surgery.      Therapeutic exercise  Seated R hip flexion, manually resisted S/L hip abduction, adduction, prone hip extension.   Reviewed progress/current status with PT towards goals   Supine hip fallouts 10x2 each  LE with transversus abdominis contraction.               Bridge 10x5 seconds for 3 sets  NuStep seat 6, arms 6, level 1 x 5 minutes for genlte movement at the R hip.    Pt states that her blood pressure has been high during the pre-op appointment  BP L arm sitting, mechanically taken, normal cuff 134/75, HR 73.   Side step while maintaining mini squat position 32 ft to the R and 32 ft to the L  Pt feels winded for what it was.     Improved exercise technique, movement at target joints, use of target muscles aftermin tomod verbal, visual, tactile cues.    Response to treatment Pt tolerated session well without aggravation of symptoms. No hip pain after session. Just fatigue.   Clinical presentation Pt demonstrates improved hip flexion and abduction strength but decreased hip adduction and extension strength since previously measured. Still demonstrates R hip pain similar to when she re-started PT. Session ended early secondary to reports of increased frequency of being winded, increased use of inhaler as well as seeing her pulmonologist right after session. Pt tolerated session well without aggravation of pain. Pt will benefit from continued skilled physical therapy services to prevent regression and prepare for surgery.     PT Short Term Goals - 05/01/19 1149      PT SHORT TERM GOAL #1   Title Patient will be independent with her HEP to decrease pain, improve strength and function.    Baseline Pt has started her HEP (03/19/2019); Pt demonstrates independence with her HEP (05/01/2019)    Time 3    Period Weeks    Status Achieved    Target Date 04/10/19             PT Long Term Goals - 04/26/20 1604      PT LONG TERM GOAL #1   Title Pt will have a decrease in R hip pain to 2/10 or less at worst to promote ability to run, perform exercises, stand up from a half kneeling position more comfortably.    Baseline 6/10 R hip pain at most (03/19/2019);  2-3/10 at most for the past 7 days (05/01/2019); 2-3/10 at most for the past 7 days (05/12/19), (05/26/2019); 3/10 (07/08/2019); 4/10 at most for the past 7 days (after workout at her gym and softball)  2-3/10 at most for the past 7 days. (11/04/2019); 4/10  R hip pain at most for the past 7 days (12/31/2019); 2/10 at most for the past 7 days (02/18/2020); 5/10 at worst for the past 7 days (04/26/2020)  Time 6    Period Weeks    Status On-going    Target Date 06/10/20      PT LONG TERM GOAL #2   Title Patient will improve R hip flexion, extension, abduction, and adduction strength by at least 1/2 MMT grade to promote ability to perform functional tasks more comfortably.    Baseline improved by 1/2 MMT grade but pain with hip adduction (05/26/2019); maintained except hip extension (07/08/2019); impmroved strength except hip extension (11/04/2019); improved strength at all target planes (12/31/2019); Decreased strength (04/26/2020)    Time 8    Period Weeks    Status Partially Met    Target Date 06/10/20      PT LONG TERM GOAL #3   Title Pt will be able to jog for at least 5 minutes without R hip pain to promote ability to participate in exercise and promote physical health    Baseline increased R hip pain when jogging or running (03/19/2019); able to perform mini jog with short stride length (05/01/2019), (05/26/2019); Able to jog about 5 min except for this past Monday (07/08/2019); Pt states being able to jog, but does not know if it is painless (09/01/2019); able to jog at treadmil speed 4.5 normal stride length, no pain (11/04/2019)    Time 6    Period Weeks    Status Achieved      PT LONG TERM GOAL #4   Title Pt will be able to perform R SLR hip flexion 5x independently as a demonstration of improved strength.    Baseline Unable to perform one full repetition without posterior pelvic tilt assist from pillow. (12/1/201); unable to perform (04/26/2020)    Time 6    Period Weeks    Status On-going    Target  Date 06/10/20                 Plan - 06/10/20 0844    Clinical Impression Statement Pt demonstrates improved hip flexion and abduction strength but decreased hip adduction and extension strength since previously measured. Still demonstrates R hip pain similar to when she re-started PT. Session ended early secondary to reports of increased frequency of being winded, increased use of inhaler as well as seeing her pulmonologist right after session. Pt tolerated session well without aggravation of pain. Pt will benefit from continued skilled physical therapy services to prevent regression and prepare for surgery.    Personal Factors and Comorbidities Comorbidity 1    Comorbidities hx of C-section    Examination-Activity Limitations Squat    Stability/Clinical Decision Making Stable/Uncomplicated    Rehab Potential Good    PT Frequency 1x / week    PT Duration 8 weeks    PT Treatment/Interventions Therapeutic activities;Therapeutic exercise;Neuromuscular re-education;Patient/family education;Manual techniques;Dry needling;Spinal Manipulations;Joint Manipulations;Aquatic Therapy;Electrical Stimulation;Iontophoresis 75m/ml Dexamethasone;Ultrasound    PT Next Visit Plan Continue with plan of care- hip and core strengthening    PT Home Exercise Plan Medbridge Access Code: [email or web address]    Consulted and Agree with Plan of Care Patient           Patient will benefit from skilled therapeutic intervention in order to improve the following deficits and impairments:  Pain,Improper body mechanics,Postural dysfunction,Decreased strength  Visit Diagnosis: Pain in right hip  Muscle weakness (generalized)     Problem List Patient Active Problem List   Diagnosis Date Noted  . 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 S/P cesarean section - triplets 11/29 02/17/2012  . Postpartum care following cesarean delivery (11/29)  02/17/2012  . Rh negative, maternal 02/17/2012  . Multiple gestation -  triplets 08/10/2011  . Routine general medical examination at a health care facility 06/23/2010  . FIBROCYSTIC BREAST DISEASE 07/17/2008  . Asthma, 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    Laygo,Miguel 06/10/2020, 8:46 AM  [facility] [address] [place], [address] Phone: [phone]   Fax:  [phone]  Name: Nicole Owens MRN: [id number] Date of Birth: 07-01-1980

## 2020-06-10 NOTE — Addendum Note (Signed)
Addended by: Delrae Rend on: 06/10/2020 10:38 AM   Modules accepted: Orders

## 2020-06-10 NOTE — Patient Instructions (Signed)
Increase Symbicort 160 2 puffs Twice daily  , rinse after use.  Prednisone 40mg  daily for 5 days  Chest xray today  Add Zyrtec 10mg  daily for 2 weeks and then As needed   Albuterol inhaler As needed   Asthma action plan  If not back to baseline by next 06/15/20 will need sooner office visit  And postpone surgery .  follow up Dr. in 6 months with PFT and As needed   Please contact office for sooner follow up if symptoms do not improve or worsen or seek emergency care

## 2020-06-10 NOTE — Assessment & Plan Note (Signed)
Asthma exacerbation -patient with mild flare, exam with normal speech no respiratory distress O2 saturations are normal.   Check CHest xray  Tx with short steroid burst  Increase ICS dose.  Asthma action plan reviewed  Discussed with patient that she has upcoming surgery next week if asthma has not back to baseline by 06/15/2020 will need a sooner evaluation and postponement of upcoming surgery. Pulmonary preop risk assessment discussed with patient with recommendations.  Needs PFTs later this year.  Plan  Patient Instructions  Increase Symbicort 160 2 puffs Twice daily  , rinse after use.  Prednisone 40mg  daily for 5 days  Chest xray today  Add Zyrtec 10mg  daily for 2 weeks and then As needed   Albuterol inhaler As needed   Asthma action plan  If not back to baseline by next 06/15/20 will need sooner office visit  And postpone surgery .  follow up Dr. in 6 months with PFT and As needed   Please contact office for sooner follow up if symptoms do not improve or worsen or seek emergency care

## 2020-06-10 NOTE — Progress Notes (Signed)
@Patient  ID: , female    DOB: [birth year], 40 y.o.   MRN: [id number]  Chief Complaint  Patient presents with  . Follow-up    Referring provider: No ref. provider found  HPI: 40 year old female followed for moderate persistent asthma Covid infection 03/2020   TEST/EVENTS :  PFT 11/2013 Nml   06/10/2020 Follow up : Asthma  Patient returns for follow up . Complains over last 3 weeks having more asthma symptoms with increased wheezing , shortness of breath , and decreased activity tolerance. With increased albuterol use.  Working out at gym , needing to use more albuterol . Typically goes 6 days a week. Normally has no issues . Noticed increased symptoms with season change .  O2 sats 98% on room air on arrival to exam room. No chest pain, hemoptysis , calf pain , syncope .  Did have Covid 03/2020, but says it was mild with no significant flare in asthma .  Symbicort dose decreased in fall 2021. Did well until last 3 weeks on this dose.   Has 8 yr triplets , very busy lifestyle.   Plans for Ventral hernia surgery and tummy tuck next week .   Allergies  Allergen Reactions  . Aspirin Shortness Of Breath and Other (See Comments)    Can trigger asthma, told nurse ibuprofen makes her feel odd and only takes one 200mg  tablet if needed    Immunization History  Administered Date(s) Administered  . Influenza,inj,Quad PF,6+ Mos 03/11/2013, 04/16/2015  . PFIZER(Purple Top)SARS-COV-2 Vaccination 06/07/2019, 06/28/2019  . Rho (D) Immune Globulin 02/18/2012  . Td 06/17/2008  . Varicella 12/07/2010    Past Medical History:  Diagnosis Date  . Asthma   . Asthma, intermittent    mild  . Carpal tunnel syndrome on both sides    with pregnancy  . Female infertility 04/30/2010   history restored after error with record merge  . Heartburn in pregnancy    nausea  . 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 Postpartum care following cesarean delivery 02/17/2012  . Rh negative,  maternal 02/17/2012  . S/P cesarean section - triplets 11/29 02/17/2012    Tobacco History: Social History   Tobacco Use  Smoking Status Never Smoker  Smokeless Tobacco Never Used   Counseling given: Not Answered   Outpatient Medications Prior to Visit  Medication Sig Dispense Refill  . acetaminophen (TYLENOL) 325 MG tablet Take 650 mg by mouth every 6 (six) hours as needed for moderate pain or headache.    . albuterol (VENTOLIN HFA) 108 (90 Base) MCG/ACT inhaler INHALE TWO PUFFS BY MOUTH THREE TIMES DAILY AS NEEDED FOR WHEEZING OR  SHORTNESS  OF  BREATH (Patient taking differently: Inhale 2 puffs into the lungs 3 (three) times daily as needed for wheezing or shortness of breath.) 18 g 3  . budesonide-formoterol (SYMBICORT) 80-4.5 MCG/ACT inhaler Inhale 2 puffs into the lungs 2 (two) times daily. (Patient taking differently: Inhale 1 puff into the lungs daily.) 30.6 g 3  . ondansetron (ZOFRAN) 4 MG tablet Take 1 tablet (4 mg total) by mouth every 8 (eight) hours as needed for nausea or vomiting. 20 tablet 0  . budesonide-formoterol (SYMBICORT) 160-4.5 MCG/ACT inhaler Inhale 2 puffs into the lungs 2 (two) times daily. (Patient not taking: Reported on 06/10/2020) 1 each 6   No facility-administered medications prior to visit.     Review of Systems:   Constitutional:   No  weight loss, night sweats,  Fevers, chills, fatigue, or  lassitude.  HEENT:   No headaches,  Difficulty swallowing,  Tooth/dental problems, or  Sore throat,                No sneezing, itching, ear ache, + nasal congestion, post nasal drip,   CV:  No chest pain,  Orthopnea, PND, swelling in lower extremities, anasarca, dizziness, palpitations, syncope.   GI  No heartburn, indigestion, abdominal pain, nausea, vomiting, diarrhea, change in bowel habits, loss of appetite, bloody stools.   Resp:  .  No chest wall deformity  Skin: no rash or lesions.  GU: no dysuria, change in color of urine, no urgency or  frequency.  No flank pain, no hematuria   MS:  No joint pain or swelling.  No decreased range of motion.  No back pain.    Physical Exam  BP 124/70 (BP Location: Left Arm, Patient Position: Sitting, Cuff Size: Normal)   Pulse 77   Temp 97.6 F (36.4 C)   Ht 5\' 2"  (1.575 m)   Wt 164 lb 12.8 oz (74.8 kg)   SpO2 98%   BMI 30.14 kg/m   GEN: A/Ox3; pleasant , NAD, well nourished    HEENT:  [place]/AT,    NOSE-clear drainage  THROAT-clear, no lesions, no postnasal drip or exudate noted.   NECK:  Supple w/ fair ROM; no JVD; normal carotid impulses w/o bruits; no thyromegaly or nodules palpated; no lymphadenopathy.  No stridor   RESP  Faint exp wheeze on forced expiration ,  no accessory muscle use, no dullness to percussion, speaks in full sentences ,   CARD:  RRR, no m/r/g, no peripheral edema, pulses intact, no cyanosis or clubbing. Neg homans sign   GI:   Soft & nt; nml bowel sounds; no organomegaly or masses detected.   Musco: Warm bil, no deformities or joint swelling noted.   Neuro: alert, no focal deficits noted.    Skin: Warm, no lesions or rashes    Lab Results:   BNP No results found for: BNP  ProBNP No results found for: PROBNP  Imaging: No results found.    PFT Results Latest Ref Rng & Units 12/01/2013  FVC-Pre L 3.13  FVC-Predicted Pre % 90  FVC-Post L 3.20  FVC-Predicted Post % 92  Pre FEV1/FVC % % 77  Post FEV1/FCV % % 79  FEV1-Pre L 2.40  FEV1-Predicted Pre % 83  FEV1-Post L 2.54    Lab Results  Component Value Date   NITRICOXIDE 42 10/07/2015        Assessment & Plan:   Asthma, intrinsic Asthma exacerbation -patient with mild flare, exam with normal speech no respiratory distress O2 saturations are normal.   Check CHest xray  Tx with short steroid burst  Increase ICS dose.  Asthma action plan reviewed  Discussed with patient that she has upcoming surgery next week if asthma has not back to baseline by 06/15/2020 will need a sooner  evaluation and postponement of upcoming surgery. Pulmonary preop risk assessment discussed with patient with recommendations.  Needs PFTs later this year.  Plan  Patient Instructions  Increase Symbicort 160 2 puffs Twice daily  , rinse after use.  Prednisone 40mg  daily for 5 days  Chest xray today  Add Zyrtec 10mg  daily for 2 weeks and then As needed   Albuterol inhaler As needed   Asthma action plan  If not back to baseline by next 06/15/20 will need sooner office visit  And postpone surgery .  follow up Dr.  in 6 months with PFT and As needed   Please contact office for sooner follow up if symptoms do not improve or worsen or seek emergency care         Rubye Oaks, NP 06/10/2020

## 2020-06-11 ENCOUNTER — Telehealth: Payer: Self-pay | Admitting: Adult Health

## 2020-06-11 NOTE — Telephone Encounter (Signed)
Called and spoke with pt letting her know that TP said that we do need to get her scheduled for a visit next Tues. 3/29 as when we saw her yesterday 3/24, she was having a flare of the asthma. Pt verbalized understanding. OV scheduled for pt with TP 3/29 for surgical clearance. Called Marianna Fuss from [facility] letting her know this info as well and stated to her after pt's visit, if we will call her and also fax over pt's OV notes to the provided fax number as well and Shaina verbalized understanding.  I did update in the appt notes for 3/29 to refer back to this encounter for the office and also the phone and fax number.

## 2020-06-11 NOTE — Telephone Encounter (Signed)
Called and spoke with pt to check to see how she was doing since her last visit with TP. Stated to her that we received a call from [facility] about not yet receiving pulmonary clearance.   Pt said that she was a little short of breath this morning 3/25 but states it is not as bad as she has been. Pt said that she was able to work out today without using her rescue inhaler. Pt said that she has been a little jittery which she wonders if this could be due to going on higher dose of Symbicort.  Stated to pt that I was going to route this updated info to TP to see if she is able to do risk assessment to clear pt for her surgery. Tammy, please advise.

## 2020-06-11 NOTE — Telephone Encounter (Signed)
That's because she did not request surgical clearance and she had asthma flare so can not give clearance unless she is better next week .  Please set up televisit or in person visit for next Tuesday to decide

## 2020-06-15 ENCOUNTER — Ambulatory Visit: Payer: Managed Care, Other (non HMO)

## 2020-06-15 ENCOUNTER — Encounter: Payer: Self-pay | Admitting: Adult Health

## 2020-06-15 ENCOUNTER — Ambulatory Visit: Payer: Managed Care, Other (non HMO) | Admitting: Adult Health

## 2020-06-15 ENCOUNTER — Other Ambulatory Visit: Payer: Self-pay

## 2020-06-15 ENCOUNTER — Telehealth: Payer: Self-pay | Admitting: Adult Health

## 2020-06-15 VITALS — BP 118/72 | HR 76 | Temp 98.2°F | Ht 62.0 in | Wt 165.8 lb

## 2020-06-15 DIAGNOSIS — J45909 Unspecified asthma, uncomplicated: Secondary | ICD-10-CM | POA: Diagnosis not present

## 2020-06-15 DIAGNOSIS — Z0000 Encounter for general adult medical examination without abnormal findings: Secondary | ICD-10-CM | POA: Insufficient documentation

## 2020-06-15 DIAGNOSIS — Z01811 Encounter for preprocedural respiratory examination: Secondary | ICD-10-CM | POA: Diagnosis not present

## 2020-06-15 NOTE — Progress Notes (Signed)
@Patient  ID: , female    DOB: 05-Sep-1980, 40 y.o.   MRN: [id number]  No chief complaint on file.   Referring provider: No ref. provider found  HPI: 40 year old female never smoker followed for moderate persistent asthma Had COVID-19 infection January 2022  TEST/EVENTS :  Pulmonary function testing September 2015 was normal  06/15/2020 Follow up : Asthma  Patient presents for a 1 week follow-up.  Patient was seen last visit for an asthma exacerbation.  Was having 3 weeks of increased cough wheezing and shortness of breath with decreased activity tolerance.  Patient is very active works out typically every day at 06/17/2020.  Has noticed that she has had increased symptoms with decreased activity tolerance over the last few weeks.  Last visit patient was given prednisone 40 mg daily for 5 days.  And Symbicort was increased to 160  2 puffs twice daily. Chest x-ray showed no acute process.  Appears stable from 2011 Since last visit patient says she is feeling much better.  Wheezing has resolved.  Activity level is back to baseline.  ACT score has improved currently at 23 Patient has returned back to the gym says that she has been able to do her full cardio and weightlifting routine without difficulty.  She has not used albuterol in the last 3 days.  Patient does have a ventral hernia with plans for upcoming surgery for repair and tummy tuck on June 16, 2020 Dr. June 18, 2020.      Allergies  Allergen Reactions  . Aspirin Shortness Of Breath and Other (See Comments)    Can trigger asthma, told nurse ibuprofen makes her feel odd and only takes one 200mg  tablet if needed    Immunization History  Administered Date(s) Administered  . Influenza,inj,Quad PF,6+ Mos 03/11/2013, 04/16/2015  . PFIZER(Purple Top)SARS-COV-2 Vaccination 06/07/2019, 06/28/2019  . Rho (D) Immune Globulin 02/18/2012  . Td 06/17/2008  . Varicella 12/07/2010    Past Medical History:  Diagnosis Date  .  Asthma   . Asthma, intermittent    mild  . Carpal tunnel syndrome on both sides    with pregnancy  . Female infertility 04/30/2010   history restored after error with record merge  . Heartburn in pregnancy    nausea  . 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 Postpartum care following cesarean delivery 02/17/2012  . Rh negative, maternal 02/17/2012  . S/P cesarean section - triplets 11/29 02/17/2012    Tobacco History: Social History   Tobacco Use  Smoking Status Never Smoker  Smokeless Tobacco Never Used   Counseling given: Not Answered   Outpatient Medications Prior to Visit  Medication Sig Dispense Refill  . acetaminophen (TYLENOL) 325 MG tablet Take 650 mg by mouth every 6 (six) hours as needed for moderate pain or headache.    . albuterol (VENTOLIN HFA) 108 (90 Base) MCG/ACT inhaler INHALE TWO PUFFS BY MOUTH THREE TIMES DAILY AS NEEDED FOR WHEEZING OR  SHORTNESS  OF  BREATH 18 g 3  . budesonide-formoterol (SYMBICORT) 160-4.5 MCG/ACT inhaler Inhale 2 puffs into the lungs 2 (two) times daily. 1 each 6  . cetirizine (ZYRTEC) 10 MG tablet Take 10 mg by mouth daily.    . budesonide-formoterol (SYMBICORT) 80-4.5 MCG/ACT inhaler Inhale 2 puffs into the lungs 2 (two) times daily. (Patient not taking: Reported on 06/15/2020) 30.6 g 3  . ondansetron (ZOFRAN) 4 MG tablet Take 1 tablet (4 mg total) by mouth every 8 (eight) hours as needed for nausea or  vomiting. (Patient not taking: Reported on 06/15/2020) 20 tablet 0  . predniSONE (DELTASONE) 20 MG tablet Take 2 tablets (40 mg total) by mouth daily with breakfast for 5 days. 10 tablet 0   No facility-administered medications prior to visit.     Review of Systems:   Constitutional:   No  weight loss, night sweats,  Fevers, chills,  fatigue, or  lassitude.  HEENT:   No headaches,  Difficulty swallowing,  Tooth/dental problems, or  Sore throat,                No sneezing, itching, ear ache, nasal congestion, post nasal drip,   CV:   No chest pain,  Orthopnea, PND, swelling in lower extremities, anasarca, dizziness, palpitations, syncope.   GI  No heartburn, indigestion, abdominal pain, nausea, vomiting, diarrhea, change in bowel habits, loss of appetite, bloody stools.   Resp: No shortness of breath with exertion or at rest.  No excess mucus, no productive cough,  No non-productive cough,  No coughing up of blood.  No change in color of mucus.  No wheezing.  No chest wall deformity  Skin: no rash or lesions.  GU: no dysuria, change in color of urine, no urgency or frequency.  No flank pain, no hematuria   MS:  No joint pain or swelling.  No decreased range of motion.  No back pain.    Physical Exam  BP 118/72 (BP Location: Left Arm, Cuff Size: Normal)   Pulse 76   Temp 98.2 F (36.8 C) (Temporal)   Ht 5\' 2"  (1.575 m)   Wt 165 lb 12.8 oz (75.2 kg)   SpO2 99%   BMI 30.33 kg/m   GEN: A/Ox3; pleasant , NAD, well nourished    HEENT:  [place]/AT,  NOSE-clear, THROAT-clear, no lesions, no postnasal drip or exudate noted.   NECK:  Supple w/ fair ROM; no JVD; normal carotid impulses w/o bruits; no thyromegaly or nodules palpated; no lymphadenopathy.    RESP  Clear  P & A; w/o, wheezes/ rales/ or rhonchi. no accessory muscle use, no dullness to percussion  CARD:  RRR, no m/r/g, no peripheral edema, pulses intact, no cyanosis or clubbing.  GI:   Soft & nt; nml bowel sounds; no organomegaly or masses detected.   Musco: Warm bil, no deformities or joint swelling noted.   Neuro: alert, no focal deficits noted.    Skin: Warm, no lesions or rashes    Lab Results:  CBC  BNP No results found for: BNP  ProBNP No results found for: PROBNP  Imaging: DG Chest 2 View  Result Date: 06/10/2020 CLINICAL DATA:  Asthma.  Follow-up EXAM: CHEST - 2 VIEW COMPARISON:  01/08/2010 FINDINGS: Generous lung volumes and mild interstitial reticulation that is stable from 2011. Normal heart size and mediastinal contours. No  osseous findings. IMPRESSION: No evidence of active disease.  Stable compared to 2011. Electronically Signed   By: 2012 M.D.   On: 06/10/2020 11:02      PFT Results Latest Ref Rng & Units 12/01/2013  FVC-Pre L 3.13  FVC-Predicted Pre % 90  FVC-Post L 3.20  FVC-Predicted Post % 92  Pre FEV1/FVC % % 77  Post FEV1/FCV % % 79  FEV1-Pre L 2.40  FEV1-Predicted Pre % 83  FEV1-Post L 2.54    Lab Results  Component Value Date   NITRICOXIDE 42 10/07/2015        Assessment & Plan:   Asthma, intrinsic Recent asthma exacerbation now improved  and back to baseline. She is fully independent and back to exercising without difficulty.  We will continue at current dose of Symbicort.  Continue on trigger prevention. Check PFTs on return.  Plan  Patient Instructions  Continue on Symbicort 160 2 puffs Twice daily  , rinse after use.  Zyrtec 10mg  daily as needed.  Albuterol inhaler As needed   Asthma action plan  Follow up Dr. in 6 months with PFT and As needed  Please contact office for sooner follow up if symptoms do not improve or worsen or seek emergency care       Preoperative respiratory examination Pulmonary preop risk assessment.- Low to moderate risk  Recent asthma exacerbation now resolved and back to baseline.  Chest x-ray without any acute process noted. Would use Symbicort day of surgery.  Mobilization as soon as possible.  And aggressive mucociliary clearance as able.  Major Pulmonary risks identified in the multifactorial risk analysis are but not limited to a) pneumonia; b) recurrent intubation risk; c) prolonged or recurrent acute respiratory failure needing mechanical ventilation; d) prolonged hospitalization; e) DVT/Pulmonary embolism; f) Acute Pulmonary edema  Recommend 1. Short duration of surgery as much as possible and avoid paralytic if possible   DVT prophylaxis if indicated. ,  Aggressive pulmonary toilet with o2, bronchodilatation, and  incentive spirometry and early ambulation     1) RISK FOR PROLONGED MECHANICAL VENTILAION - > 48h  1A) Arozullah - Prolonged mech ventilation risk Arozullah Postperative Pulmonary Risk Score - for mech ventilation dependence >48h Marchelle Gearing, Ann Surg 2000, major non-cardiac surgery) Comment Score  Type of surgery - abd ao aneurysm (27), thoracic (21), neurosurgery / upper abdominal / vascular (21), neck (11) abd  14  Emergency Surgery - (11)  0  ALbumin < 3 or poor nutritional state - (9)  0  BUN > 30 -  (8)  0  Partial or completely dependent functional status - (7)  0  COPD -  (6) Asthma controlled  2  Age - 60 to 29 (4), > 70  (6)  0  TOTAL    Risk Stratifcation scores  - < 10 (0.5%), 11-19 (1.8%), 20-27 (4.2%), 28-40 (10.1%), >40 (26.6%)  16         Amous Crewe, NP 06/15/2020

## 2020-06-15 NOTE — Assessment & Plan Note (Addendum)
Pulmonary preop risk assessment.- Low to moderate risk  Recent asthma exacerbation now resolved and back to baseline.  Chest x-ray without any acute process noted. Would use Symbicort day of surgery.  Mobilization as soon as possible.  And aggressive mucociliary clearance as able.  Major Pulmonary risks identified in the multifactorial risk analysis are but not limited to a) pneumonia; b) recurrent intubation risk; c) prolonged or recurrent acute respiratory failure needing mechanical ventilation; d) prolonged hospitalization; e) DVT/Pulmonary embolism; f) Acute Pulmonary edema  Recommend 1. Short duration of surgery as much as possible and avoid paralytic if possible   DVT prophylaxis if indicated. ,  Aggressive pulmonary toilet with o2, bronchodilatation, and incentive spirometry and early ambulation     1) RISK FOR PROLONGED MECHANICAL VENTILAION - > 48h  1A) Arozullah - Prolonged mech ventilation risk Arozullah Postperative Pulmonary Risk Score - for mech ventilation dependence >48h [employer], Ann Surg 2000, major non-cardiac surgery) Comment Score  Type of surgery - abd ao aneurysm (27), thoracic (21), neurosurgery / upper abdominal / vascular (21), neck (11) abd  14  Emergency Surgery - (11)  0  ALbumin < 3 or poor nutritional state - (9)  0  BUN > 30 -  (8)  0  Partial or completely dependent functional status - (7)  0  COPD -  (6) Asthma controlled  2  Age - 60 to 38 (4), > 70  (6)  0  TOTAL    Risk Stratifcation scores  - < 10 (0.5%), 11-19 (1.8%), 20-27 (4.2%), 28-40 (10.1%), >40 (26.6%)  16

## 2020-06-15 NOTE — Telephone Encounter (Signed)
Nicole Owens calling from [facility] stating that she was speaking to someone a/b a surgical clearance and their power went out, she was actually speaking with girls up front who had just started taking msg she can be reached @ [phone].Caren Griffins

## 2020-06-15 NOTE — Patient Instructions (Signed)
Continue on Symbicort 160 2 puffs Twice daily  , rinse after use.  Zyrtec 10mg  daily as needed.  Albuterol inhaler As needed   Asthma action plan  Follow up Dr. in 6 months with PFT and As needed  Please contact office for sooner follow up if symptoms do not improve or worsen or seek emergency care

## 2020-06-15 NOTE — Assessment & Plan Note (Signed)
Recent asthma exacerbation now improved and back to baseline. She is fully independent and back to exercising without difficulty.  We will continue at current dose of Symbicort.  Continue on trigger prevention. Check PFTs on return.  Plan  Patient Instructions  Continue on Symbicort 160 2 puffs Twice daily  , rinse after use.  Zyrtec 10mg  daily as needed.  Albuterol inhaler As needed   Asthma action plan  Follow up Dr. in 6 months with PFT and As needed  Please contact office for sooner follow up if symptoms do not improve or worsen or seek emergency care

## 2020-06-15 NOTE — Telephone Encounter (Signed)
Called Shanna back but she did not answer. Will wait for her to call back.

## 2020-06-16 DIAGNOSIS — Z719 Counseling, unspecified: Secondary | ICD-10-CM

## 2020-06-16 NOTE — Telephone Encounter (Signed)
Spoke with Devonne Doughty  She states needing form for surgery clearance filled out  I advised that we no longer do this, we have the provider do a rick assessment  She already has TP's note with risk assessment  Will have the provider there review this again and see if anything further needed  Will go ahead and close encounter

## 2020-06-17 HISTORY — PX: HERNIA REPAIR: SHX51

## 2020-06-24 ENCOUNTER — Other Ambulatory Visit: Payer: Self-pay

## 2020-06-24 ENCOUNTER — Encounter: Payer: Self-pay | Admitting: Plastic Surgery

## 2020-06-24 ENCOUNTER — Ambulatory Visit (INDEPENDENT_AMBULATORY_CARE_PROVIDER_SITE_OTHER): Payer: Managed Care, Other (non HMO) | Admitting: Plastic Surgery

## 2020-06-24 VITALS — BP 142/89 | HR 75

## 2020-06-24 DIAGNOSIS — M6208 Separation of muscle (nontraumatic), other site: Secondary | ICD-10-CM

## 2020-06-24 NOTE — Progress Notes (Signed)
Patient is a 40 year old female here with her husband for follow-up on her abdominal surgery.  She has some swelling as expected.  No sign of infection.  She has the drain in place draining well.  All the dressings are in place and the TopiFoam is in place.  Her bowels are moving and her pain is controlled.  We will hopefully be able to remove the drain next week when we see her for her follow-up.

## 2020-06-25 ENCOUNTER — Encounter: Payer: Managed Care, Other (non HMO) | Admitting: Plastic Surgery

## 2020-07-02 ENCOUNTER — Encounter: Payer: Managed Care, Other (non HMO) | Admitting: Surgical

## 2020-07-05 ENCOUNTER — Ambulatory Visit (INDEPENDENT_AMBULATORY_CARE_PROVIDER_SITE_OTHER): Payer: Self-pay | Admitting: Surgical

## 2020-07-05 ENCOUNTER — Other Ambulatory Visit: Payer: Self-pay

## 2020-07-05 ENCOUNTER — Encounter: Payer: Self-pay | Admitting: Surgical

## 2020-07-05 VITALS — BP 136/87 | HR 95

## 2020-07-05 DIAGNOSIS — M6208 Separation of muscle (nontraumatic), other site: Secondary | ICD-10-CM

## 2020-07-05 NOTE — Progress Notes (Signed)
Patient is a 40 year old female here for follow-up after abdominal surgery.  She underwent abdominoplasty by Dr. Ulice Bold and hernia repair with mesh by Dr. Derrell Lolling on 06/17/2020.  Called the office today reporting that she has been having some chills, fatigue and increased pain.    She reports that she is not having any shortness of breath, chest pain, nausea, vomiting.  She denies any urinary symptoms.  She reports she is moving her bowels normally.  She is drinking and eating normally.  She reports that on average she is having about 10cc/day from her right JP drain.  Chaperone present on exam On exam abdominal incision is intact and healing very nicely.  No wounds are noted.  Umbilicus incision is intact and healing very nicely.  Umbilicus is viable.  No subcutaneous fluid collections are noted with palpation.  Right JP drain with serosanguineous drainage in bulb.  There is no erythema of the abdominal incision.  There is some surrounding redness of the PERI drain site, but it appears more irritated than infected.  It is not cellulitic.  Some tenderness noted with palpation of her abdomen.  Recommend continue with compressive garment 24/7 for another 3 to 4 weeks.  Recommend avoiding strenuous activities.  We did remove the right JP drain today due to low output and patient tolerated this fine.  I recommend if her symptoms do not resolve should be evaluated by her primary care provider as I do not notice anything on exam in regards to her abdomen that would be suggestive of an infection.    Recommend following up in 1 to 2 weeks for reevaluation.  Recommend calling with questions or concerns.  Recommend Vaseline and gauze to right JP drain wound site.

## 2020-07-06 ENCOUNTER — Encounter: Payer: Managed Care, Other (non HMO) | Admitting: Surgical

## 2020-07-07 ENCOUNTER — Telehealth: Payer: Self-pay

## 2020-07-07 ENCOUNTER — Other Ambulatory Visit: Payer: Self-pay | Admitting: Surgical

## 2020-07-07 MED ORDER — SULFAMETHOXAZOLE-TRIMETHOPRIM 800-160 MG PO TABS: 1.0000 | ORAL_TABLET | Freq: Two times a day (BID) | ORAL | 0 refills | Status: AC

## 2020-07-07 NOTE — Telephone Encounter (Signed)
Responded via MyChart.

## 2020-07-07 NOTE — Telephone Encounter (Signed)
Patient called to say that it's very warm above her incision and tender.  Patient said on her right side, it's very sore, swollen, pink, looks a little bruised, and it indents a little bit.  Patient said that this is the same side that she had the drain removed from on Monday.  Patient is wondering if she needs to be seen.  Please call.

## 2020-07-23 ENCOUNTER — Ambulatory Visit (INDEPENDENT_AMBULATORY_CARE_PROVIDER_SITE_OTHER): Payer: Managed Care, Other (non HMO) | Admitting: Surgical

## 2020-07-23 ENCOUNTER — Other Ambulatory Visit: Payer: Self-pay

## 2020-07-23 DIAGNOSIS — M6208 Separation of muscle (nontraumatic), other site: Secondary | ICD-10-CM

## 2020-07-23 NOTE — Progress Notes (Signed)
Patient is a 40 year old female here for follow-up after abdominoplasty by Dr. Ulice Bold and hernia repair with mesh by Dr. Derrell Lolling on 06/17/2020.  She is 5 weeks postop.  She reports overall she is doing really well.  She reports that she is scheduled for hip surgery and 10 days.  She reports that she has seen Dr. Derrell Lolling.  She is not having any infectious symptoms.  She has some questions about her bellybutton and some residual sutures.  Chaperone present on exam On exam abdominal incision is intact, umbilicus incision is healing nicely.  There is no wounds noted.  She has some residual swelling still present.  No tenderness noted with palpation.  No erythema or cellulitic changes noted.  In regards to postop restrictions from our office, recommend continue to wear compressive garment or spanks 24/7 for 2 more weeks.  Recommend avoiding any strenuous activities for 2 more weeks.  Recommend avoiding any abdominal exercises for 3-4 more weeks.  Slowly progress and increase as able.    Picture was taken and placed in the patient's chart with patient permission.  Recommend following up in 5 to 6 weeks for reevaluation.  Call with questions or concerns.

## 2020-07-28 ENCOUNTER — Other Ambulatory Visit: Payer: Self-pay | Admitting: Internal Medicine

## 2020-07-28 MED ORDER — ALBUTEROL SULFATE HFA 108 (90 BASE) MCG/ACT IN AERS: 2.0000 | INHALATION_SPRAY | Freq: Four times a day (QID) | RESPIRATORY_TRACT | 5 refills | Status: DC | PRN

## 2020-07-29 NOTE — Pre-Procedure Instructions (Signed)
Surgical Instructions    Your procedure is scheduled on Tuesday, May 17th.  Report to [facility] Main Entrance "A" at 10:30 A.M., then check in with the Admitting office.  Call this number if you have problems the morning of surgery:  [phone]   If you have any questions prior to your surgery date call [phone]: Open Monday-Friday 8am-4pm    Remember:  Do not eat after midnight the night before your surgery  You may drink clear liquids until 9:30 a.m. the morning of your surgery.   Clear liquids allowed are: Water, Non-Citrus Juices (without pulp), Carbonated Beverages, Clear Tea, Black Coffee Only, and Gatorade.  Patient Instructions  . The night before surgery:  o No food after midnight. ONLY clear liquids after midnight   . The day of surgery (if you do NOT have diabetes):  o Drink ONE (1) Pre-Surgery Clear Ensure by 9:30 am the morning of surgery   o This drink was given to you during your hospital  pre-op appointment visit. o Nothing else to drink after completing the  Pre-Surgery Clear Ensure.         If you have questions, please contact your surgeon's office.     Take these medicines the morning of surgery with A SIP OF WATER  cetirizine (ZYRTEC) budesonide-formoterol (SYMBICORT)  Albuterol inhaler-as needed; please bring with you on the day of surgery.  As of today, STOP taking any Aspirin (unless otherwise instructed by your surgeon) Aleve, Naproxen, Ibuprofen, Motrin, Advil, Goody's, BC's, all herbal medications, fish oil, and all vitamins.                     Do NOT Smoke (Tobacco/Vaping) or drink Alcohol 24 hours prior to your procedure.  If you use a CPAP at night, you may bring all equipment for your overnight stay.   Contacts, glasses, piercing's, hearing aid's, dentures or partials may not be worn into surgery, please bring cases for these belongings.    For patients admitted to the hospital, discharge time will be determined by your treatment  team.   Patients discharged the day of surgery will not be allowed to drive home, and someone needs to stay with them for 24 hours.    Special instructions:   [place]- Preparing For Surgery  Before surgery, you can play an important role. Because skin is not sterile, your skin needs to be as free of germs as possible. You can reduce the number of germs on your skin by washing with CHG (chlorahexidine gluconate) Soap before surgery.  CHG is an antiseptic cleaner which kills germs and bonds with the skin to continue killing germs even after washing.    Oral Hygiene is also important to reduce your risk of infection.  Remember - BRUSH YOUR TEETH THE MORNING OF SURGERY WITH YOUR REGULAR TOOTHPASTE  Please do not use if you have an allergy to CHG or antibacterial soaps. If your skin becomes reddened/irritated stop using the CHG.  Do not shave (including legs and underarms) for at least 48 hours prior to first CHG shower. It is OK to shave your face.  Please follow these instructions carefully.   1. Shower the NIGHT BEFORE SURGERY and the MORNING OF SURGERY  2. If you chose to wash your hair, wash your hair first as usual with your normal shampoo.  3. After you shampoo, rinse your hair and body thoroughly to remove the shampoo.  4. Use CHG Soap as you would any other  liquid soap. You can apply CHG directly to the skin and wash gently with a scrungie or a clean washcloth.   5. Apply the CHG Soap to your body ONLY FROM THE NECK DOWN.  Do not use on open wounds or open sores. Avoid contact with your eyes, ears, mouth and genitals (private parts). Wash Face and genitals (private parts)  with your normal soap.   6. Wash thoroughly, paying special attention to the area where your surgery will be performed.  7. Thoroughly rinse your body with warm water from the neck down.  8. DO NOT shower/wash with your normal soap after using and rinsing off the CHG Soap.  9. Pat yourself dry with a CLEAN  TOWEL.  10. Wear CLEAN PAJAMAS to bed the night before surgery  11. Place CLEAN SHEETS on your bed the night before your surgery  12. DO NOT SLEEP WITH PETS.   Day of Surgery: Shower with CHG soap. Do not wear jewelry, make up, or nail polish Do not wear lotions, powders, perfumes, or deodorant. Do not shave 48 hours prior to surgery.  Do not bring valuables to the hospital. [facility] is not responsible for any belongings or valuables. Wear Clean/Comfortable clothing the morning of surgery Remember to brush your teeth WITH YOUR REGULAR TOOTHPASTE.   Please read over the following fact sheets that you were given.

## 2020-07-30 ENCOUNTER — Encounter (HOSPITAL_COMMUNITY)
Admission: RE | Admit: 2020-07-30 | Discharge: 2020-07-30 | Disposition: A | Discharge status: Home / Self Care | Payer: Managed Care, Other (non HMO) | Source: Ambulatory Visit | Attending: Orthopedic Surgery | Admitting: Orthopedic Surgery

## 2020-07-30 ENCOUNTER — Encounter (HOSPITAL_COMMUNITY): Payer: Self-pay

## 2020-07-30 ENCOUNTER — Other Ambulatory Visit: Payer: Self-pay

## 2020-07-30 DIAGNOSIS — Z01812 Encounter for preprocedural laboratory examination: Secondary | ICD-10-CM | POA: Insufficient documentation

## 2020-07-30 DIAGNOSIS — Z7951 Long term (current) use of inhaled steroids: Secondary | ICD-10-CM | POA: Diagnosis not present

## 2020-07-30 DIAGNOSIS — S73191A Other sprain of right hip, initial encounter: Secondary | ICD-10-CM | POA: Insufficient documentation

## 2020-07-30 DIAGNOSIS — Y939 Activity, unspecified: Secondary | ICD-10-CM | POA: Diagnosis not present

## 2020-07-30 DIAGNOSIS — Z8616 Personal history of COVID-19: Secondary | ICD-10-CM | POA: Insufficient documentation

## 2020-07-30 DIAGNOSIS — Y929 Unspecified place or not applicable: Secondary | ICD-10-CM | POA: Diagnosis not present

## 2020-07-30 DIAGNOSIS — J45909 Unspecified asthma, uncomplicated: Secondary | ICD-10-CM | POA: Insufficient documentation

## 2020-07-30 LAB — CBC
HCT: 37.2 % (ref 36.0–46.0)
Hemoglobin: 12.3 g/dL (ref 12.0–15.0)
MCH: 29.3 pg (ref 26.0–34.0)
MCHC: 33.1 g/dL (ref 30.0–36.0)
MCV: 88.6 fL (ref 80.0–100.0)
Platelets: 242 10*3/uL (ref 150–400)
RBC: 4.2 MIL/uL (ref 3.87–5.11)
RDW: 12.9 % (ref 11.5–15.5)
WBC: 7 10*3/uL (ref 4.0–10.5)
nRBC: 0 % (ref 0.0–0.2)

## 2020-07-30 NOTE — Progress Notes (Signed)
PCP - Denies Cardiologist - Denies  PPM/ICD - Denies   Chest x-ray - 03/24/202 EKG - N/A Stress Test - Denies ECHO - Denies Cardiac Cath - Denies  Sleep Study - Denies  Denies hx of Diabetes  Blood Thinner Instructions: N/A Aspirin Instructions: Stop unless MD instructed otherwise.  ERAS Protcol - Yes PRE-SURGERY Ensure or G2- Pre-Surgery  Ensure  COVID TEST- Scheduled 08/01/2020   Anesthesia review: Yes, recent hx of asthma exacerbation.   Patient denies shortness of breath, fever, cough and chest pain at PAT appointment   All instructions explained to the patient, with a verbal understanding of the material. Patient agrees to go over the instructions while at home for a better understanding. Patient also instructed to self quarantine after being tested for COVID-19. The opportunity to ask questions was provided.

## 2020-08-02 ENCOUNTER — Other Ambulatory Visit: Payer: Self-pay

## 2020-08-02 ENCOUNTER — Other Ambulatory Visit (HOSPITAL_COMMUNITY): Payer: Managed Care, Other (non HMO)

## 2020-08-02 ENCOUNTER — Other Ambulatory Visit
Admission: RE | Admit: 2020-08-02 | Discharge: 2020-08-02 | Disposition: A | Discharge status: Home / Self Care | Payer: Managed Care, Other (non HMO) | Source: Ambulatory Visit | Attending: Orthopedic Surgery | Admitting: Orthopedic Surgery

## 2020-08-02 DIAGNOSIS — Z20822 Contact with and (suspected) exposure to covid-19: Secondary | ICD-10-CM | POA: Insufficient documentation

## 2020-08-02 DIAGNOSIS — Z01812 Encounter for preprocedural laboratory examination: Secondary | ICD-10-CM | POA: Diagnosis not present

## 2020-08-02 LAB — SARS CORONAVIRUS 2 (TAT 6-24 HRS): SARS Coronavirus 2: NEGATIVE

## 2020-08-02 NOTE — Progress Notes (Signed)
Anesthesia Chart Review:  Case: [id number] Date/Time: 08/03/20 1220   Procedure: Rt hip arthroscopic labrum repair (Right Hip)   Anesthesia type: Choice   Pre-op diagnosis: Right hip labrum tear   Location: [facility] ROOM 04 / [facility]   Surgeons: Yolonda Kida, MD      DISCUSSION: Patient is a 40 year old female scheduled for the above procedure.  History includes never smoker, asthma, Rh negative, abdominoplasty with umbilical transposition/hernia repair (06/17/20, Dr. Alan Ripper [place] with Dr. Axel Filler, [facility]), + COVID-19 (03/29/20).   She had a preoperative pulmonology evaluation on 06/15/20 with Rubye Oaks, NP prior to undergoing abdominoplasty/hernia repair. She had recovered from an asthma exacerbation and felt "Low to moderate risk" for that procedure. PAT RN communicated that patient without any acute respiratory issues. She tolerated recent out-patient surgery as mentioned.   She is for urine pregnancy test on arrival. 08/02/20 presurgical COVID-19 test is in process. Anesthesia team to evaluate on the day of surgery.   VS: BP (!) 142/89   Pulse 87   Temp 36.8 C (Oral)   Resp 18   Ht 5\' 2"  (1.575 m)   Wt 72.2 kg   LMP 07/18/2020 (Exact Date)   SpO2 100%   BMI 29.12 kg/m     PROVIDERS: Patient, No Pcp Per (Inactive) 09/17/2020, MD is pulmonologist   LABS: Labs reviewed: Acceptable for surgery. (all labs ordered are listed, but only abnormal results are displayed)  Labs Reviewed  CBC    Spirometry 12/01/13:  Ref. Range 12/01/2013 15:58  FVC-Pre Latest Units: L 3.13  FVC-%Pred-Pre Latest Units: % 90  FEV1-Pre Latest Units: L 2.40  FEV1-%Pred-Pre Latest Units: % 83  Pre FEV1/FVC ratio Latest Units: % 77  FEV1FVC-%Pred-Pre Latest Units: % 91  FEF 25-75 Pre Latest Units: L/sec 1.93  FEF2575-%Pred-Pre Latest Units: % 60  FEV6-Pre Latest Units: L 3.11  FEV6-%Pred-Pre Latest Units: % 90  Pre FEV6/FVC Ratio Latest Units: % 99   FEV6FVC-%Pred-Pre Latest Units: % 100  FVC-Post Latest Units: L 3.20  FVC-%Pred-Post Latest Units: % 92  FVC-%Change-Post Latest Units: % 2  FEV1-Post Latest Units: L 2.54  FEV1-%Pred-Post Latest Units: % 87  FEV1-%Change-Post Latest Units: % 5  Post FEV1/FVC ratio Latest Units: % 79  FEV1FVC-%Change-Post Latest Units: % 3  FEF 25-75 Post Latest Units: L/sec 2.40  FEF2575-%Pred-Post Latest Units: % 75  FEF2575-%Change-Post Latest Units: % 24  FEV6-Post Latest Units: L 3.20  FEV6-%Pred-Post Latest Units: % 93  FEV6-%Change-Post Latest Units: % 2  Post FEV6/FVC ratio Latest Units: % 100  FEV6FVC-%Pred-Post Latest Units: % 100  FEV6FVC-%Change-Post Latest Units: % 0    IMAGES: CXR 06/10/20: FINDINGS: Generous lung volumes and mild interstitial reticulation that is stable from 2011. Normal heart size and mediastinal contours. No osseous findings. IMPRESSION: No evidence of active disease.  Stable compared to 2011.   EKG: N/A   CV: N/A  Past Medical History:  Diagnosis Date  . Asthma   . Asthma, intermittent    mild  . Carpal tunnel syndrome on both sides    with pregnancy  . Female infertility 04/30/2010   history restored after error with record merge  . Heartburn in pregnancy    nausea  . 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 Postpartum care following cesarean delivery 02/17/2012  . Rh negative, maternal 02/17/2012  . S/P cesarean section - triplets 11/29 02/17/2012    Past Surgical History:  Procedure Laterality Date  . CESAREAN SECTION  02/16/2012   Procedure: CESAREAN SECTION;  Surgeon: Lenoard Aden, MD;  Location: [facility];  Service: Obstetrics;  Laterality: N/A;  . HERNIA REPAIR  06/17/2020   hernia repair with mesh and tummy tuck  . NO PAST SURGERIES    . WISDOM TOOTH EXTRACTION      MEDICATIONS: . albuterol (VENTOLIN HFA) 108 (90 Base) MCG/ACT inhaler  . albuterol (VENTOLIN HFA) 108 (90 Base) MCG/ACT inhaler  . budesonide-formoterol  (SYMBICORT) 160-4.5 MCG/ACT inhaler  . cetirizine (ZYRTEC) 10 MG tablet  . Multiple Vitamin (MULTIVITAMIN WITH MINERALS) TABS tablet   No current facility-administered medications for this encounter.    Shonna Chock, PA-C Surgical Short Stay/Anesthesiology [facility] Phone [phone] [facility] Phone [phone] 08/02/2020 12:23 PM

## 2020-08-02 NOTE — Anesthesia Preprocedure Evaluation (Addendum)
Anesthesia Evaluation  Patient identified by MRN, date of birth, ID band Patient awake    Reviewed: Allergy & Precautions, NPO status , Patient's Chart, lab work & pertinent test results  Airway Mallampati: II  TM Distance: >3 FB Neck ROM: Full    Dental no notable dental hx.    Pulmonary asthma ,    Pulmonary exam normal breath sounds clear to auscultation       Cardiovascular negative cardio ROS Normal cardiovascular exam Rhythm:Regular Rate:Normal     Neuro/Psych negative neurological ROS  negative psych ROS   GI/Hepatic negative GI ROS, Neg liver ROS,   Endo/Other  negative endocrine ROS  Renal/GU negative Renal ROS  negative genitourinary   Musculoskeletal negative musculoskeletal ROS (+)   Abdominal   Peds negative pediatric ROS (+)  Hematology negative hematology ROS (+)   Anesthesia Other Findings   Reproductive/Obstetrics negative OB ROS                            Anesthesia Physical Anesthesia Plan  ASA: II  Anesthesia Plan: General   Post-op Pain Management:    Induction: Intravenous  PONV Risk Score and Plan: 3 and Scopolamine patch - Pre-op, Midazolam, Treatment may vary due to age or medical condition, Ondansetron and Dexamethasone  Airway Management Planned: Oral ETT  Additional Equipment: None  Intra-op Plan:   Post-operative Plan: Extubation in OR  Informed Consent: I have reviewed the patients History and Physical, chart, labs and discussed the procedure including the risks, benefits and alternatives for the proposed anesthesia with the patient or authorized representative who has indicated his/her understanding and acceptance.     Dental advisory given  Plan Discussed with: CRNA and Anesthesiologist  Anesthesia Plan Comments: ( )      Anesthesia Quick Evaluation

## 2020-08-03 ENCOUNTER — Ambulatory Visit (HOSPITAL_COMMUNITY): Payer: Managed Care, Other (non HMO) | Admitting: Vascular Surgery

## 2020-08-03 ENCOUNTER — Encounter (HOSPITAL_COMMUNITY)
Admission: RE | Disposition: A | Discharge status: Home / Self Care | Payer: Self-pay | Source: Home / Self Care | Attending: Orthopedic Surgery

## 2020-08-03 ENCOUNTER — Ambulatory Visit (HOSPITAL_COMMUNITY): Payer: Managed Care, Other (non HMO) | Admitting: Anesthesiology

## 2020-08-03 ENCOUNTER — Ambulatory Visit (HOSPITAL_COMMUNITY)
Admission: RE | Admit: 2020-08-03 | Discharge: 2020-08-03 | Disposition: A | Discharge status: Home / Self Care | Payer: Managed Care, Other (non HMO) | Attending: Orthopedic Surgery | Admitting: Orthopedic Surgery

## 2020-08-03 ENCOUNTER — Encounter (HOSPITAL_COMMUNITY): Payer: Self-pay | Admitting: Orthopedic Surgery

## 2020-08-03 ENCOUNTER — Ambulatory Visit (HOSPITAL_COMMUNITY): Payer: Managed Care, Other (non HMO)

## 2020-08-03 DIAGNOSIS — S73191A Other sprain of right hip, initial encounter: Secondary | ICD-10-CM | POA: Insufficient documentation

## 2020-08-03 DIAGNOSIS — J4520 Mild intermittent asthma, uncomplicated: Secondary | ICD-10-CM | POA: Insufficient documentation

## 2020-08-03 DIAGNOSIS — Z79899 Other long term (current) drug therapy: Secondary | ICD-10-CM | POA: Insufficient documentation

## 2020-08-03 DIAGNOSIS — Z7951 Long term (current) use of inhaled steroids: Secondary | ICD-10-CM | POA: Insufficient documentation

## 2020-08-03 DIAGNOSIS — Z419 Encounter for procedure for purposes other than remedying health state, unspecified: Secondary | ICD-10-CM

## 2020-08-03 DIAGNOSIS — Z886 Allergy status to analgesic agent status: Secondary | ICD-10-CM | POA: Insufficient documentation

## 2020-08-03 DIAGNOSIS — Z8349 Family history of other endocrine, nutritional and metabolic diseases: Secondary | ICD-10-CM | POA: Diagnosis not present

## 2020-08-03 DIAGNOSIS — M25851 Other specified joint disorders, right hip: Secondary | ICD-10-CM | POA: Diagnosis present

## 2020-08-03 DIAGNOSIS — X58XXXA Exposure to other specified factors, initial encounter: Secondary | ICD-10-CM | POA: Insufficient documentation

## 2020-08-03 DIAGNOSIS — Z8249 Family history of ischemic heart disease and other diseases of the circulatory system: Secondary | ICD-10-CM | POA: Insufficient documentation

## 2020-08-03 HISTORY — PX: HIP ARTHROSCOPY: SHX668

## 2020-08-03 LAB — POCT PREGNANCY, URINE: Preg Test, Ur: NEGATIVE

## 2020-08-03 SURGERY — ARTHROSCOPY HIP
Anesthesia: General | Site: Hip | Laterality: Right

## 2020-08-03 MED ORDER — FENTANYL CITRATE (PF) 250 MCG/5ML IJ SOLN
INTRAMUSCULAR | Status: AC
  Filled 2020-08-03: qty 5

## 2020-08-03 MED ORDER — OXYCODONE HCL 5 MG PO TABS
ORAL_TABLET | ORAL | Status: AC
  Filled 2020-08-03: qty 1

## 2020-08-03 MED ORDER — DOXYCYCLINE HYCLATE 20 MG PO TABS: 20.0000 mg | ORAL_TABLET | Freq: Every day | ORAL | 0 refills | Status: AC

## 2020-08-03 MED ORDER — ORAL CARE MOUTH RINSE: 15.0000 mL | Freq: Once | OROMUCOSAL | Status: AC

## 2020-08-03 MED ORDER — OXYCODONE HCL 5 MG PO TABS: 5.0000 mg | ORAL_TABLET | Freq: Three times a day (TID) | ORAL | 0 refills | Status: DC | PRN

## 2020-08-03 MED ORDER — OXYCODONE HCL 5 MG PO TABS
5.0000 mg | ORAL_TABLET | Freq: Once | ORAL | Status: AC | PRN
  Administered 2020-08-03: 5 mg via ORAL

## 2020-08-03 MED ORDER — ACETAMINOPHEN 500 MG PO TABS
1000.0000 mg | ORAL_TABLET | Freq: Once | ORAL | Status: AC
  Administered 2020-08-03: 1000 mg via ORAL
  Filled 2020-08-03: qty 2

## 2020-08-03 MED ORDER — OXYCODONE HCL 5 MG/5ML PO SOLN: 5.0000 mg | Freq: Once | ORAL | Status: AC | PRN

## 2020-08-03 MED ORDER — DOCUSATE SODIUM 100 MG PO CAPS: 100.0000 mg | ORAL_CAPSULE | Freq: Every day | ORAL | 2 refills | Status: DC | PRN

## 2020-08-03 MED ORDER — PROPOFOL 10 MG/ML IV BOLUS
INTRAVENOUS | Status: DC | PRN
  Administered 2020-08-03: 150 mg via INTRAVENOUS

## 2020-08-03 MED ORDER — SODIUM CHLORIDE 0.9 % IR SOLN
Status: DC | PRN
  Administered 2020-08-03: 16000 mL

## 2020-08-03 MED ORDER — CEFAZOLIN SODIUM-DEXTROSE 2-4 GM/100ML-% IV SOLN
2.0000 g | INTRAVENOUS | Status: AC
  Administered 2020-08-03: 2 g via INTRAVENOUS
  Filled 2020-08-03: qty 100

## 2020-08-03 MED ORDER — FENTANYL CITRATE (PF) 100 MCG/2ML IJ SOLN
INTRAMUSCULAR | Status: AC
  Filled 2020-08-03: qty 2

## 2020-08-03 MED ORDER — ONDANSETRON HCL 4 MG/2ML IJ SOLN
INTRAMUSCULAR | Status: DC | PRN
  Administered 2020-08-03: 4 mg via INTRAVENOUS

## 2020-08-03 MED ORDER — ONDANSETRON 4 MG PO TBDP: 4.0000 mg | ORAL_TABLET | Freq: Three times a day (TID) | ORAL | 0 refills | Status: DC | PRN

## 2020-08-03 MED ORDER — PROPOFOL 10 MG/ML IV BOLUS
INTRAVENOUS | Status: AC
  Filled 2020-08-03: qty 20

## 2020-08-03 MED ORDER — CHLORHEXIDINE GLUCONATE 0.12 % MT SOLN
15.0000 mL | Freq: Once | OROMUCOSAL | Status: AC
  Administered 2020-08-03: 15 mL via OROMUCOSAL
  Filled 2020-08-03: qty 15

## 2020-08-03 MED ORDER — FENTANYL CITRATE (PF) 100 MCG/2ML IJ SOLN
INTRAMUSCULAR | Status: DC | PRN
  Administered 2020-08-03: 50 ug via INTRAVENOUS
  Administered 2020-08-03: 100 ug via INTRAVENOUS

## 2020-08-03 MED ORDER — LACTATED RINGERS IV SOLN: INTRAVENOUS | Status: DC

## 2020-08-03 MED ORDER — SCOPOLAMINE 1 MG/3DAYS TD PT72
1.0000 | MEDICATED_PATCH | TRANSDERMAL | Status: DC
  Administered 2020-08-03: 1.5 mg via TRANSDERMAL
  Filled 2020-08-03: qty 1

## 2020-08-03 MED ORDER — MIDAZOLAM HCL 5 MG/5ML IJ SOLN
INTRAMUSCULAR | Status: DC | PRN
  Administered 2020-08-03: 2 mg via INTRAVENOUS

## 2020-08-03 MED ORDER — DICLOFENAC SODIUM 50 MG PO TBEC: 50.0000 mg | DELAYED_RELEASE_TABLET | Freq: Two times a day (BID) | ORAL | 0 refills | Status: AC

## 2020-08-03 MED ORDER — 0.9 % SODIUM CHLORIDE (POUR BTL) OPTIME
TOPICAL | Status: DC | PRN
  Administered 2020-08-03: 1000 mL

## 2020-08-03 MED ORDER — BUPIVACAINE-EPINEPHRINE (PF) 0.25% -1:200000 IJ SOLN
INTRAMUSCULAR | Status: AC
  Filled 2020-08-03: qty 30

## 2020-08-03 MED ORDER — AMISULPRIDE (ANTIEMETIC) 5 MG/2ML IV SOLN
10.0000 mg | Freq: Once | INTRAVENOUS | Status: DC | PRN
Start: 2020-08-03 — End: 2020-08-03

## 2020-08-03 MED ORDER — LACTATED RINGERS IV SOLN: INTRAVENOUS | Status: DC | PRN

## 2020-08-03 MED ORDER — BUPIVACAINE-EPINEPHRINE 0.25% -1:200000 IJ SOLN
INTRAMUSCULAR | Status: DC | PRN
  Administered 2020-08-03: 20 mL

## 2020-08-03 MED ORDER — ROCURONIUM BROMIDE 10 MG/ML (PF) SYRINGE
PREFILLED_SYRINGE | INTRAVENOUS | Status: DC | PRN
  Administered 2020-08-03: 10 mg via INTRAVENOUS
  Administered 2020-08-03: 50 mg via INTRAVENOUS
  Administered 2020-08-03: 10 mg via INTRAVENOUS

## 2020-08-03 MED ORDER — FENTANYL CITRATE (PF) 100 MCG/2ML IJ SOLN
25.0000 ug | INTRAMUSCULAR | Status: DC | PRN
  Administered 2020-08-03 (×3): 50 ug via INTRAVENOUS

## 2020-08-03 MED ORDER — METHOCARBAMOL 500 MG PO TABS: 500.0000 mg | ORAL_TABLET | Freq: Four times a day (QID) | ORAL | 1 refills | Status: DC | PRN

## 2020-08-03 MED ORDER — SUGAMMADEX SODIUM 200 MG/2ML IV SOLN
INTRAVENOUS | Status: DC | PRN
  Administered 2020-08-03: 150 mg via INTRAVENOUS

## 2020-08-03 MED ORDER — MIDAZOLAM HCL 2 MG/2ML IJ SOLN
INTRAMUSCULAR | Status: AC
  Filled 2020-08-03: qty 2

## 2020-08-03 MED ORDER — PROMETHAZINE HCL 25 MG/ML IJ SOLN: 6.2500 mg | INTRAMUSCULAR | Status: DC | PRN

## 2020-08-03 SURGICAL SUPPLY — 45 items
ANCHOR SUT 2.4X8.9 MINI PEEK (Anchor) ×2 IMPLANT
ANCHOR SUT HIP FBRTK #2 KNTLS (Anchor) ×8 IMPLANT
BLADE STRT W/HANDLE 4 (BLADE) ×2 IMPLANT
BLADE SURG 11 STRL SS (BLADE) ×2 IMPLANT
BUR ROUND HI FLUTE 8 4X19 (BURR) ×1 IMPLANT
BURR ROUND HI FLUTE 8 4X19 (BURR) ×2
CHLORAPREP W/TINT 26 (MISCELLANEOUS) ×2 IMPLANT
COVER WAND RF STERILE (DRAPES) IMPLANT
DECANTER SPIKE VIAL GLASS SM (MISCELLANEOUS) ×2 IMPLANT
DISSECTOR 4.2MMX19CM CVD HL (ORTHOPEDIC DISPOSABLE SUPPLIES) ×2 IMPLANT
DISSECTOR 4.2MMX19CM HL (MISCELLANEOUS) ×2 IMPLANT
DRAPE C-ARM 42X72 X-RAY (DRAPES) ×2 IMPLANT
DRAPE STERI IOBAN 125X83 (DRAPES) ×2 IMPLANT
DRAPE U-SHAPE 47X51 STRL (DRAPES) ×4 IMPLANT
DRSG TEGADERM 4X4.75 (GAUZE/BANDAGES/DRESSINGS) ×4 IMPLANT
GAUZE SPONGE 4X4 12PLY STRL (GAUZE/BANDAGES/DRESSINGS) ×2 IMPLANT
GLOVE BIO SURGEON STRL SZ7.5 (GLOVE) ×4 IMPLANT
GLOVE SRG 8 PF TXTR STRL LF DI (GLOVE) ×2 IMPLANT
GLOVE SURG UNDER POLY LF SZ8 (GLOVE) ×2
GOWN STRL REUS W/ TWL LRG LVL3 (GOWN DISPOSABLE) ×1 IMPLANT
GOWN STRL REUS W/ TWL XL LVL3 (GOWN DISPOSABLE) ×1 IMPLANT
GOWN STRL REUS W/TWL LRG LVL3 (GOWN DISPOSABLE) ×1
GOWN STRL REUS W/TWL XL LVL3 (GOWN DISPOSABLE) ×1
KIT ANCHOR SUT FBRTK CVD (ORTHOPEDIC DISPOSABLE SUPPLIES) ×2 IMPLANT
KIT BASIN OR (CUSTOM PROCEDURE TRAY) ×2 IMPLANT
KIT HIP ARTHROSCOPY (SET/KITS/TRAYS/PACK) ×2 IMPLANT
KIT TURNOVER KIT B (KITS) ×2 IMPLANT
MANIFOLD NEPTUNE II (INSTRUMENTS) ×4 IMPLANT
MARKER SKIN DUAL TIP RULER LAB (MISCELLANEOUS) ×2 IMPLANT
PACK SURGICAL SETUP 50X90 (CUSTOM PROCEDURE TRAY) ×2 IMPLANT
PAD ARMBOARD 7.5X6 YLW CONV (MISCELLANEOUS) ×4 IMPLANT
PASSER SUT SWIFTSTITCH HIP CRT (INSTRUMENTS) ×2 IMPLANT
SPONGE LAP 18X18 RF (DISPOSABLE) ×2 IMPLANT
SUT ETHILON 4 0 PS 2 18 (SUTURE) ×2 IMPLANT
SUT FIBERWIRE #2 38 T-5 BLUE (SUTURE)
SUT MNCRL AB 3-0 PS2 27 (SUTURE) ×2 IMPLANT
SUTURE FIBERWR #2 38 T-5 BLUE (SUTURE) IMPLANT
SYR 50ML LL SCALE MARK (SYRINGE) IMPLANT
TAPE CLOTH 4X10 WHT NS (GAUZE/BANDAGES/DRESSINGS) ×2 IMPLANT
TOWEL GREEN STERILE (TOWEL DISPOSABLE) ×2 IMPLANT
TUBE CONNECTING 12X1/4 (SUCTIONS) ×4 IMPLANT
TUBING ARTHROSCOPY IRRIG 16FT (MISCELLANEOUS) ×2 IMPLANT
WAND APOLLO RF 50D ABLATOR (BUR) ×2 IMPLANT
WATER STERILE IRR 1000ML POUR (IV SOLUTION) ×2 IMPLANT
YANKAUER SUCT BULB TIP NO VENT (SUCTIONS) ×2 IMPLANT

## 2020-08-03 NOTE — Op Note (Signed)
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  She has had exhaustive conservative management to include steroid injection as well as physical therapy and oral medications.  She is here today for arthroscopic surgery.  We have previously discussed the risk, benefits, and indications of this procedure including but not limited to bleeding, infection, damage to surrounding neurovascular structures, heterotropic ossification, the risk of bleeding, the risk of stiffness, and need for further surgery as well as the risk of anesthesia. presents today for hip arthroscopy with possible loose body resection as well as femoroplasty to address his underlying hip pathology.;  The patient did consent to the procedure after discussion of the risks and benefits.   PREOPERATIVE DIAGNOSIS:  1. Right hip femoral acetabular impingement 2.  Right hip labrum tear   POSTOPERATIVE DIAGNOSIS:  1.  Right hip femoral acetabular impingement 2.  Right hip anterior superior labral tearing   PROCEDURE:  1.    Right hip arthroscopic labral repair 2.  Right hip arthroscopic acetabuloplasty   SURGEON: Maryan Rued, M.D.   ASSIST: Dion Saucier, PA-C  Assistant attestation:  PA Mcclung was present for the entire procedure.  He was critical in positioning patient, manipulating the hip and implantation of arthroscopic anchors.  He was also utilized for closure of wounds and transported to PACU.Marland Kitchen   ANESTHESIA:  general   IV FLUIDS AND URINE: See anesthesia.   ESTIMATED BLOOD LOSS: 10 mL.   IMPLANTS:  Arthrex 1.8 mm Fibertak all suture anchor x 2   DRAINS: None   COMPLICATIONS: None.   DESCRIPTION OF PROCEDURE:  The patient was on a hand fracture table.  A well-padded oversized peroneal post was placed with well-padded traction boots applied to both right and left leg.  SCD was on the contralateral limb.  Timeout was performed by myself, the surgeon, to confirm  correct site of surgery and confirm the procedure to be performed, and confirm IV antibiotics had been given.   Provisional traction was applied to the hip placing adequate distraction to into the joint.  The right hip was then prepped and draped in the usual standard sterile fashion.  Femoral traction was reapplied to the hip placing it in the position of slight flexion with neutral rotation and neutral abduction.    Next, standard standard anterior peritrochanteric portal was established just superior and anterior to the superior tip of the greater trochanter.  Needle localization with the aid of image intensification and air contrast arthrogram confirmed intra-articular placement.  A Nitinol wire was then placed, 11 blade used to make a stab incision and then a 4.5 mm cannula was inserted atraumatically.  Next a mid anterior portal was established via direct visualization in the anterior triangle.  It was established approximately 7 cm distant at about a 45 angle off of the axial plane from the previously established anterior peritrochanteric portal.  Needle was inserted under direct visualization.  A Nitinol wire was then placed an 11 blade used to make a stab incision and a 5.0 cannula was inserted atraumatically.     Operative findings: On the diagnostic arthroscopy of this right hip there was An area of chondral labral dissociation consistent with labrum tear.  This was noted to be at approximately from 9:00 to about 12:00 on this right hip.  There was no cartilage delamination.  There were no loose bodies. Peripherally there was moderate synovitis.  Femoral head Cartage was normal.  The acetabular Cartledge was normal.  Colloid fossa, ligament teres, and fovea capitis were normal.  Of note, she did have very diminutive labral tissue with some free edge degeneration noted throughout the visualized portion.  However, no true acetabular detachment other than at that 9:00 to 12 o'clock position.      At this time, we did an intraportal capsulotomy with a Beaver blade.  This was utilized to enlarge the capsular incision to allow for larger cannula insertion into work lateral and medial.  Motorized shaver was used to perform a gentle synovectomy.  The labrum was then debrided with the motorized shaver and ArthroCare wand.    Based on preoperative CT scan and x-rays, we identified the pincer lesion at the level of the tear.  We utilized a motorized bur to perform an acetabuloplasty and resect about 4 mm of lateral overhang.  Next we then prepared the torn labrum for its repair.  Utilizing the motorized bur the acetabular rim was gently decorticated to allow for good bleeding bone.  Once this was accomplished we then maneuvered the drill guide into place at the 10 o'clock position.  We drilled the pilot hole for the 1.8 mm fiber tach.  The anchor was then placed into the pilot hole.  Care was taken not to penetrate the articular cartilage.  We then placed the anchor which had good bony fixation.  We then passed a simple repair stitch around the labrum with the soft tissue penetrator.  This was then passed through the self locking mechanism of the anchor and showed excellent tension and a nice rolled repair of the labrum.  This was then repeated at the 11 o'clock position.    A third anchor was placed in the 9 o'clock position.  Unfortunately, the knotless mechanism did not work as it typically does.  Therefore we loaded the passing stitch into a 2.4 mm push lock anchor.  This was utilized to secure that third anchor.    At this juncture we were satisfied that the intra-articular portion of the hip arthroscopy was complete.  We then removed gross traction placed a traction stitch in the inferior leaflet of the capsulotomy.  This allowed improved peripheral inspection of the junction of the anterior lateral and medial head neck junction.  On dynamic examination there was no obvious cam deformity.   Furthermore, this correlated with preoperative 3D CT scan.     Next, arthroscopic cannulas were removed.  Skin incision was closed with 3-0 Monocryl, and Steri-Strips.  Wounds were injected with quarter percent plain Marcaine with epinephrine.  The wounds were then dressed with Steri-Strips, Adaptic, 4 x 4's, and a Tegaderm.   Postop plan:   Touchdown weightbearing 3 weeks.  He will begin physical therapy next week.  Shee will be on the standard hip arthroscopy postoperative medication protocol to prevent heterotropic ossification and gastric ulcer.  I will see her in the office in 2 weeks.

## 2020-08-03 NOTE — Discharge Instructions (Signed)
Orthopedic surgery discharge instructions:  -You will be touchdown weightbearing to the operative lower extremity for 3 weeks.   utilizing crutches for mobilization.  You may begin moving your hip in a rotational fashion as soon as possible.  -You should apply ice to the hip for 30 minutes out of each hour that you are awake.  -Maintain your postoperative bandage until follow-up appointment.  This is a waterproof bandage and you may shower with this on as soon as you feel able.  Do not submerge underwater.  -Over-the-counter medications you should take:   -Zantac 150 mg twice per day while taking the NSAID, which will be for 28 total days.   -At the completion of your 14 days of Diclofenac you should take an 81 mg aspirin once per day x4 weeks to prevent blood clots.    -You should take Colace 100 mg tablet twice per day to prevent constipation.  -Follow-up with Dr. Aundria Rud in 2 weeks.

## 2020-08-03 NOTE — Anesthesia Procedure Notes (Signed)
Procedure Name: Intubation Date/Time: 08/03/2020 1:40 PM Performed by: Eligha Bridegroom, CRNA Pre-anesthesia Checklist: Patient identified, Emergency Drugs available, Suction available, Timeout performed and Patient being monitored Patient Re-evaluated:Patient Re-evaluated prior to induction Oxygen Delivery Method: Circle system utilized Induction Type: IV induction Ventilation: Mask ventilation without difficulty Laryngoscope Size: Mac Grade View: Grade II Tube type: Oral Number of attempts: 1 Airway Equipment and Method: Stylet Placement Confirmation: ETT inserted through vocal cords under direct vision,  positive ETCO2 and breath sounds checked- equal and bilateral Secured at: 21 cm Tube secured with: Tape Dental Injury: Teeth and Oropharynx as per pre-operative assessment

## 2020-08-03 NOTE — Progress Notes (Signed)
Orthopedic Tech Progress Note Patient Details:  Nicole Owens 15-Oct-1980 [id number] PACU RN called requesting a PAIR OF THE CRUTCHES Ortho Devices Type of Ortho Device: Crutches Ortho Device/Splint Interventions: Ordered,Application,Adjustment   Post Interventions Patient Tolerated: Well,Ambulated well Instructions Provided: Poper ambulation with device,Care of device   Donald Pore 08/03/2020, 4:32 PM

## 2020-08-03 NOTE — Brief Op Note (Signed)
08/03/2020  3:20 PM  PATIENT:  Nicole Owens  40 y.o. female  PRE-OPERATIVE DIAGNOSIS:  Right hip labrum tear  POST-OPERATIVE DIAGNOSIS:  Right hip labrum tear  PROCEDURE:  Procedure(s): Rt hip arthroscopic labrum repair (Right), right hip arthroscopic acetabuloplasty  SURGEON:  Surgeon(s) and Role:    * Yolonda Kida, MD - Primary  PHYSICIAN ASSISTANT: Dion Saucier, PA-C  ANESTHESIA:   local and general  EBL: 5 cc  BLOOD ADMINISTERED:none  DRAINS: none   LOCAL MEDICATIONS USED:  MARCAINE     SPECIMEN:  No Specimen  DISPOSITION OF SPECIMEN:  N/A  COUNTS:  YES  TOURNIQUET:  * No tourniquets in log *  DICTATION: .Note written in EPIC  PLAN OF CARE: Discharge to home after PACU  PATIENT DISPOSITION:  PACU - hemodynamically stable.   Delay start of Pharmacological VTE agent (>24hrs) due to surgical blood loss or risk of bleeding: not applicable

## 2020-08-03 NOTE — Transfer of Care (Signed)
Immediate Anesthesia Transfer of Care Note  Patient: Nicole Owens  Procedure(s) Performed: Rt hip arthroscopic labrum repair (Right Hip)  Patient Location: PACU  Anesthesia Type:General  Level of Consciousness: awake, alert  and oriented  Airway & Oxygen Therapy: Patient Spontanous Breathing and Patient connected to nasal cannula oxygen  Post-op Assessment: Report given to RN and Post -op Vital signs reviewed and stable  Post vital signs: Reviewed and stable  Last Vitals:  Vitals Value Taken Time  BP 148/122 08/03/20 1530  Temp 36.7 C 08/03/20 1530  Pulse 95 08/03/20 1531  Resp 17 08/03/20 1531  SpO2 100 % 08/03/20 1531  Vitals shown include unvalidated device data.  Last Pain:  Vitals:   08/03/20 1056  TempSrc:   PainSc: 0-No pain      Patients Stated Pain Goal: 4 (08/03/20 1056)  Complications: No complications documented.

## 2020-08-03 NOTE — H&P (Signed)
ORTHOPAEDIC H and P  REQUESTING PHYSICIAN: Yolonda Kida, MD  PCP:  Patient, No Pcp Per (Inactive)  Chief Complaint: Right hip pain  HPI: Nicole Owens is a 40 y.o. female who complains of recalcitrant right hip pain despite conservative treatment with therapy and injections.  She is here today for right hip arthroscopy with labral repair possible osteoplasty's.  No new complaints.  We just saw her in the office last week for preoperative evaluation.  Past Medical History:  Diagnosis Date  . Asthma   . Asthma, intermittent    mild  . Carpal tunnel syndrome on both sides    with pregnancy  . Female infertility 04/30/2010   history restored after error with record merge  . Heartburn in pregnancy    nausea  . 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 Postpartum care following cesarean delivery 02/17/2012  . Rh negative, maternal 02/17/2012  . S/P cesarean section - triplets 11/29 02/17/2012   Past Surgical History:  Procedure Laterality Date  . CESAREAN SECTION  02/16/2012   Procedure: CESAREAN SECTION;  Surgeon: Lenoard Aden, MD;  Location: [facility];  Service: Obstetrics;  Laterality: N/A;  . HERNIA REPAIR  06/17/2020   hernia repair with mesh and tummy tuck  . NO PAST SURGERIES    . WISDOM TOOTH EXTRACTION     Social History   Socioeconomic History  . Marital status: Married    Spouse name: Abbegail Matuska  . Number of children: 0  . Years of education: Not on file  . Highest education level: Not on file  Occupational History  . Occupation: Personal assistant: [employer]  Tobacco Use  . Smoking status: Never Smoker  . Smokeless tobacco: Never Used  Vaping Use  . Vaping Use: Never used  Substance and Sexual Activity  . Alcohol use: Yes    Comment: social but none while pregnant  . Drug use: No  . Sexual activity: Yes    Birth control/protection: None    Comment: pregnant  Other Topics Concern  . Not on file  Social History Narrative    ** Merged History Encounter **       Social Determinants of Health   Financial Resource Strain: Not on file  Food Insecurity: Not on file  Transportation Needs: Not on file  Physical Activity: Not on file  Stress: Not on file  Social Connections: Not on file   Family History  Problem Relation Age of Onset  . Hypertension Father   . Obesity Sister    Allergies  Allergen Reactions  . Aspirin Shortness Of Breath and Other (See Comments)    Can trigger asthma, told nurse ibuprofen makes her feel odd and only takes one 200mg  tablet if needed   Prior to Admission medications   Medication Sig Start Date End Date Taking? Authorizing Provider  albuterol (VENTOLIN HFA) 108 (90 Base) MCG/ACT inhaler INHALE TWO PUFFS BY MOUTH THREE TIMES DAILY AS NEEDED FOR WHEEZING OR  SHORTNESS  OF  BREATH Patient taking differently: Inhale 2 puffs into the lungs 3 (three) times daily as needed for shortness of breath or wheezing. 06/10/20  Yes Parrett, Tammy S, NP  albuterol (VENTOLIN HFA) 108 (90 Base) MCG/ACT inhaler Inhale 2 puffs into the lungs every 6 (six) hours as needed for wheezing or shortness of breath. 07/28/20  Yes 09/27/20, MD  budesonide-formoterol (SYMBICORT) 160-4.5 MCG/ACT inhaler Inhale 2 puffs into the lungs 2 (two) times daily. 06/08/20  Yes  Kalman Shan, MD  cetirizine (ZYRTEC) 10 MG tablet Take 10 mg by mouth daily.   Yes [provider]  Multiple Vitamin (MULTIVITAMIN WITH MINERALS) TABS tablet Take 1 tablet by mouth daily.   Yes [provider]   No results found.  Positive ROS: All other systems have been reviewed and were otherwise negative with the exception of those mentioned in the HPI and as above.  Physical Exam: General: Alert, no acute distress Cardiovascular: No pedal edema Respiratory: No cyanosis, no use of accessory musculature GI: No organomegaly, abdomen is soft and non-tender Skin: No lesions in the area of chief  complaint Neurologic: Sensation intact distally Psychiatric: Patient is competent for consent with normal mood and affect Lymphatic: No axillary or cervical lymphadenopathy  MUSCULOSKELETAL:  Right lower extremity is warm and well-perfused.  No open wounds or skin lesions.  She is neurovascular intact distal.  Assessment: 1.  Right hip labrum tear 2.  Right hip femoral acetabular impingement  Plan: -Our plan is to proceed with right hip arthroscopy with interventions to include possible labrum repair with possible osteoplasty femur and or acetabulum.  -We again reviewed the risk of bleeding, infection, damage to surrounding nerves and vessels, stiffness, heterotropic bone, progression of disease, DVT, and the risk of anesthesia.  She has provided informed consent.  -Plan will be to discharge home postoperatively from PACU with crutches.  She will be touchdown weightbearing.    Yolonda Kida, MD Cell [phone]    08/03/2020 12:28 PM

## 2020-08-04 ENCOUNTER — Encounter (HOSPITAL_COMMUNITY): Payer: Self-pay | Admitting: Orthopedic Surgery

## 2020-08-04 NOTE — Anesthesia Postprocedure Evaluation (Signed)
Anesthesia Post Note  Patient: Nicole Owens  Procedure(s) Performed: Rt hip arthroscopic labrum repair (Right Hip)     Patient location during evaluation: PACU Anesthesia Type: General Level of consciousness: sedated Pain management: pain level controlled Vital Signs Assessment: post-procedure vital signs reviewed and stable Respiratory status: spontaneous breathing and respiratory function stable Cardiovascular status: stable Postop Assessment: no apparent nausea or vomiting Anesthetic complications: no   No complications documented.  Last Vitals:  Vitals:   08/03/20 1611 08/03/20 1627  BP: (!) 142/89   Pulse: 76   Resp: 11   Temp:  (!) 36.4 C  SpO2: 100%     Last Pain:  Vitals:   08/03/20 1558  TempSrc:   PainSc: 3    Pain Goal: Patients Stated Pain Goal: 4 (08/03/20 1056)                 Mellody Dance

## 2020-08-27 ENCOUNTER — Ambulatory Visit: Payer: Managed Care, Other (non HMO) | Admitting: Plastic Surgery

## 2020-09-02 ENCOUNTER — Ambulatory Visit: Payer: Managed Care, Other (non HMO) | Admitting: Plastic Surgery

## 2020-09-14 ENCOUNTER — Encounter: Payer: Self-pay | Admitting: Plastic Surgery

## 2020-09-14 ENCOUNTER — Other Ambulatory Visit: Payer: Self-pay

## 2020-09-14 ENCOUNTER — Ambulatory Visit (INDEPENDENT_AMBULATORY_CARE_PROVIDER_SITE_OTHER): Payer: Managed Care, Other (non HMO) | Admitting: Plastic Surgery

## 2020-09-14 DIAGNOSIS — M6208 Separation of muscle (nontraumatic), other site: Secondary | ICD-10-CM

## 2020-09-14 NOTE — Progress Notes (Signed)
   Subjective:    Patient ID: Nicole Owens, female    DOB: Sep 02, 1980, 40 y.o.   MRN: [id number]  The patient is a 40 yrs old female here for follow up after her abdominoplasty and hernia repair.  She is doing a great job with healing.  There is no sign of infection or seroma.  The incision is together and healed.  The patient had her right hip operated on a few weeks ago.  As a result she has not been able to work out or gotten back to her normal routine.  She is planning on getting back in shape and losing more weight.  She has slight excess tissue on the lateral aspect on both sides.  She mentioned feeling some tightness in the left abdominal area after hard cough or sneeze.  I think this could be the mesh and have encouraged her to talk with Dr. Derrell Lolling if it continues.     Review of Systems  Constitutional: Negative.   HENT: Negative.    Eyes: Negative.   Respiratory: Negative.    Cardiovascular: Negative.   Gastrointestinal: Negative.   Genitourinary: Negative.   Neurological: Negative.   Hematological: Negative.   Psychiatric/Behavioral: Negative.        Objective:   Physical Exam Vitals and nursing note reviewed.  Constitutional:      Appearance: Normal appearance.  HENT:     Head: Normocephalic and atraumatic.  Cardiovascular:     Rate and Rhythm: Normal rate.     Pulses: Normal pulses.  Pulmonary:     Effort: Pulmonary effort is normal.  Abdominal:     General: Abdomen is flat.  Skin:    General: Skin is warm.     Capillary Refill: Capillary refill takes less than 2 seconds.  Neurological:     Mental Status: She is alert. Mental status is at baseline.       Assessment & Plan:     ICD-10-CM   1. Diastasis of rectus abdominis  M62.08        Recommend giving it a little time to see if she would like to lose a little bit of weight.  This could change the appearance of the lateral scars.  Small excision could be done in the office.  If she wants like though  that would be something with be done in the operating room. Pictures were obtained of the patient and placed in the chart with the patient's or guardian's permission.

## 2020-10-27 ENCOUNTER — Telehealth: Payer: Self-pay

## 2020-10-27 NOTE — Telephone Encounter (Signed)
I know this patient personally. Would be OK to accept as new patient - can schedule September/October

## 2020-10-27 NOTE — Telephone Encounter (Signed)
Orilla called in wanted to know if she could restablish care but with Dr. Selena Batten I did explain we are not taking anyone until November but wanted to just ask.

## 2020-12-02 ENCOUNTER — Other Ambulatory Visit: Payer: Self-pay

## 2020-12-02 ENCOUNTER — Ambulatory Visit (INDEPENDENT_AMBULATORY_CARE_PROVIDER_SITE_OTHER): Payer: Managed Care, Other (non HMO) | Admitting: Family Medicine

## 2020-12-02 ENCOUNTER — Encounter: Payer: Self-pay | Admitting: Family Medicine

## 2020-12-02 VITALS — BP 130/82 | HR 76 | Temp 97.0°F | Ht 62.25 in | Wt 172.4 lb

## 2020-12-02 DIAGNOSIS — R232 Flushing: Secondary | ICD-10-CM

## 2020-12-02 DIAGNOSIS — Z0000 Encounter for general adult medical examination without abnormal findings: Secondary | ICD-10-CM | POA: Diagnosis not present

## 2020-12-02 LAB — COMPREHENSIVE METABOLIC PANEL
ALT: 12 U/L (ref 0–35)
AST: 12 U/L (ref 0–37)
Albumin: 4.3 g/dL (ref 3.5–5.2)
Alkaline Phosphatase: 55 U/L (ref 39–117)
BUN: 13 mg/dL (ref 6–23)
CO2: 26 mEq/L (ref 19–32)
Calcium: 8.9 mg/dL (ref 8.4–10.5)
Chloride: 103 mEq/L (ref 96–112)
Creatinine, Ser: 0.7 mg/dL (ref 0.40–1.20)
GFR: 107.77 mL/min (ref >60.00)
Glucose, Bld: 92 mg/dL (ref 70–99)
Potassium: 4.3 mEq/L (ref 3.5–5.1)
Sodium: 136 mEq/L (ref 135–145)
Total Bilirubin: 0.7 mg/dL (ref 0.2–1.2)
Total Protein: 7.1 g/dL (ref 6.0–8.3)

## 2020-12-02 LAB — LIPID PANEL
Cholesterol: 206 mg/dL — ABNORMAL HIGH (ref 0–200)
HDL: 74.3 mg/dL (ref >39.00)
LDL Cholesterol: 122 mg/dL — ABNORMAL HIGH (ref 0–99)
NonHDL: 131.68
Total CHOL/HDL Ratio: 3
Triglycerides: 46 mg/dL (ref 0.0–149.0)
VLDL: 9.2 mg/dL (ref 0.0–40.0)

## 2020-12-02 LAB — TSH: TSH: 0.95 u[IU]/mL (ref 0.35–5.50)

## 2020-12-02 LAB — CBC WITH DIFFERENTIAL/PLATELET
Basophils Absolute: 0 10*3/uL (ref 0.0–0.1)
Basophils Relative: 0.4 % (ref 0.0–3.0)
Eosinophils Absolute: 0.3 10*3/uL (ref 0.0–0.7)
Eosinophils Relative: 8.2 % — ABNORMAL HIGH (ref 0.0–5.0)
HCT: 40.9 % (ref 36.0–46.0)
Hemoglobin: 13.8 g/dL (ref 12.0–15.0)
Lymphocytes Relative: 31.8 % (ref 12.0–46.0)
Lymphs Abs: 1.1 10*3/uL (ref 0.7–4.0)
MCHC: 33.8 g/dL (ref 30.0–36.0)
MCV: 87.2 fl (ref 78.0–100.0)
Monocytes Absolute: 0.3 10*3/uL (ref 0.1–1.0)
Monocytes Relative: 8.2 % (ref 3.0–12.0)
Neutro Abs: 1.8 10*3/uL (ref 1.4–7.7)
Neutrophils Relative %: 51.4 % (ref 43.0–77.0)
Platelets: 232 10*3/uL (ref 150.0–400.0)
RBC: 4.69 Mil/uL (ref 3.87–5.11)
RDW: 14.1 % (ref 11.5–15.5)
WBC: 3.4 10*3/uL — ABNORMAL LOW (ref 4.0–10.5)

## 2020-12-02 NOTE — Patient Instructions (Addendum)
Please call the location of your choice from the menu below to schedule your Mammogram and/or Bone Density appointment.    [facility]   [facility]                      Phone:  [phone] [address]                               [place], [place] [address]                                                             Services: Traditional and 3D Mammogram, Bone Density    [facility]   Phone:  [phone]   [address]                                                                            [place], [place] [address]                                            Services: 3D Mammogram and Bone Density  Continue exercise and healthy eating

## 2020-12-02 NOTE — Progress Notes (Signed)
Annual Exam   Chief Complaint:  Chief Complaint  Patient presents with   Establish Care    Last Pap: 2021/ Requesting records    History of Present Illness:  Ms. Nicole Owens is a 40 y.o. G1P0103 who LMP was Patient's last menstrual period was 11/26/2020 (exact date)., presents today for her annual examination.     Nutrition Diet: generally good Exercise: 5 days a week She does not get adequate calcium and Vitamin D in her diet.   Social History   Tobacco Use  Smoking Status Never  Smokeless Tobacco Never   Social History   Substance and Sexual Activity  Alcohol Use Yes   Comment: 1-2 times a week, 1 glass   Social History   Substance and Sexual Activity  Drug Use No    Safety The patient wears seatbelts: yes.     The patient feels safe at home and in their relationships: yes.  General Health Dentist in the last year: Yes Eye doctor: yes  Menstrual Her menses are regular every 28-30 days, lasting 6 day(s).   Dysmenorrhea none. She does not have intermenstrual bleeding.  Notes some Hot Flashes, ?mood swings   GYN She is not sexually active.    Cervical Cancer Screening:   Last Pap:   2021 Results were: no abnormalities /neg HPV DNA    Breast Cancer Screening There is no FH of breast cancer. There is no FH of ovarian cancer. BRCA screening Not Indicated.  Discussed that for average risk women between age 61-49 screening may reduce the risk of breast cancer death, however, at a lower rate than those over age 96. And that the the false-positive rates resulting in unnecessary biopsies with more screening is higher. The balance of benefits vs harms likely improves as you progress through your 40s. The patient does want a mammogram this year.     Weight Wt Readings from Last 3 Encounters:  12/02/20 172 lb 6 oz (78.2 kg)  08/03/20 159 lb 2.8 oz (72.2 kg)  07/30/20 159 lb 3 oz (72.2 kg)   Patient has high BMI  BMI Readings from Last 1 Encounters:   12/02/20 31.28 kg/m     Chronic disease screening Blood pressure monitoring:  BP Readings from Last 3 Encounters:  12/02/20 130/82  08/03/20 (!) 142/89  07/30/20 (!) 142/89    Lipid Monitoring: Indication for screening: age >73, obesity, diabetes, family hx, CV risk factors.  Lipid screening: Yes  No results found for: CHOL, HDL, LDLCALC, LDLDIRECT, TRIG, CHOLHDL   Diabetes Screening: age >24, overweight, family hx, PCOS, hx of gestational diabetes, at risk ethnicity Diabetes Screening screening: Yes  No results found for: HGBA1C   Past Medical History:  Diagnosis Date   Asthma    Asthma, intermittent    mild   Carpal tunnel syndrome on both sides    with pregnancy   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   Female infertility 04/30/2010   history restored after error with record merge   [place] 07/17/2008   Qualifier: Diagnosis of  By: Diona Browner MD, Amy     Heartburn in pregnancy    nausea   Multiple gestation - triplets 08/10/2011   Postpartum care following cesarean delivery 02/17/2012   Rh negative, maternal 02/17/2012   S/P cesarean section - triplets 11/29 02/17/2012    Past Surgical History:  Procedure Laterality Date   CESAREAN SECTION  02/16/2012   Procedure: CESAREAN SECTION;  Surgeon: Lovenia Kim, MD;  Location:  [facility];  Service: Obstetrics;  Laterality: N/A;   HERNIA REPAIR  06/17/2020   hernia repair with mesh and tummy tuck   HIP ARTHROSCOPY Right 08/03/2020   Procedure: Rt hip arthroscopic labrum repair;  Surgeon: Nicholes Stairs, MD;  Location: [facility];  Service: Orthopedics;  Laterality: Right;   WISDOM TOOTH EXTRACTION      Prior to Admission medications   Medication Sig Start Date End Date Taking? Authorizing Provider  albuterol (VENTOLIN HFA) 108 (90 Base) MCG/ACT inhaler Inhale 2 puffs into the lungs every 6 (six) hours as needed for wheezing or shortness of breath. 07/28/20  Yes Brand Males, MD   budesonide-formoterol (SYMBICORT) 160-4.5 MCG/ACT inhaler Inhale 2 puffs into the lungs 2 (two) times daily. 06/08/20  Yes Brand Males, MD  cetirizine (ZYRTEC) 10 MG tablet Take 10 mg by mouth as needed.   Yes [provider]  docusate sodium (COLACE) 100 MG capsule Take 1 capsule (100 mg total) by mouth daily as needed. 08/03/20 08/03/21 Yes Nicholes Stairs, MD  ELDERBERRY PO Take 1 each by mouth as needed.   Yes [provider]  Multiple Vitamin (MULTIVITAMIN WITH MINERALS) TABS tablet Take 1 tablet by mouth daily.   Yes [provider]    Allergies  Allergen Reactions   Aspirin Shortness Of Breath and Other (See Comments)    Can trigger asthma, told nurse ibuprofen makes her feel odd and only takes one 225m tablet if needed    Gynecologic History: Patient's last menstrual period was 11/26/2020 (exact date).  Obstetric History: G1P0103  Social History   Socioeconomic History   Marital status: Married    Spouse name: RMaeven Mcdougall  Number of children: 3   Years of education: college   Highest education level: Not on file  Occupational History   Occupation: mAgricultural engineer [employer]  Tobacco Use   Smoking status: Never   Smokeless tobacco: Never  Vaping Use   Vaping Use: Never used  Substance and Sexual Activity   Alcohol use: Yes    Comment: 1-2 times a week, 1 glass   Drug use: No   Sexual activity: Not Currently  Other Topics Concern   Not on file  Social History Narrative   12/02/20   From: the area   Living: with husband Ron (2010) and 3 children   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 - MMeta AMitzi Hansen TMarcello Moores(2013)      Enjoys: work-out, running, bake, crafts      Exercise: 5 days a week   Diet: not bad, does snack at night      Safety   Seat belts: Yes    Guns: No   Safe in relationships: Yes       Social Determinants of HRadio broadcast assistantStrain: Not on  file  Food Insecurity: Not on file  Transportation Needs: Not on file  Physical Activity: Not on file  Stress: Not on file  Social Connections: Not on file  Intimate Partner Violence: Not on file    Family History  Problem Relation Age of Onset   Hypertension Father    Diabetes Father    Obesity Sister    Heart attack Maternal Grandfather    Stroke Paternal Grandfather     Review of Systems  Constitutional:  Negative for chills and fever.  HENT:  Negative for congestion and sore throat.   Eyes:  Negative for blurred  vision and double vision.  Respiratory:  Negative for shortness of breath.   Cardiovascular:  Negative for chest pain.  Gastrointestinal:  Negative for heartburn, nausea and vomiting.  Genitourinary: Negative.   Musculoskeletal: Negative.  Negative for myalgias.  Skin:  Negative for rash.  Neurological:  Negative for dizziness and headaches.  Endo/Heme/Allergies:  Does not bruise/bleed easily.  Psychiatric/Behavioral:  Negative for depression. The patient is not nervous/anxious.     Physical Exam BP 130/82   Pulse 76   Temp (!) 97 F (36.1 C) (Temporal)   Ht 5' 2.25" (1.581 m)   Wt 172 lb 6 oz (78.2 kg)   LMP 11/26/2020 (Exact Date)   SpO2 98%   BMI 31.28 kg/m    BP Readings from Last 3 Encounters:  12/02/20 130/82  08/03/20 (!) 142/89  07/30/20 (!) 142/89      Physical Exam Constitutional:      General: She is not in acute distress.    Appearance: She is well-developed. She is not diaphoretic.  HENT:     Head: Normocephalic and atraumatic.     Right Ear: External ear normal.     Left Ear: External ear normal.     Nose: Nose normal.  Eyes:     General: No scleral icterus.    Extraocular Movements: Extraocular movements intact.     Conjunctiva/sclera: Conjunctivae normal.  Cardiovascular:     Rate and Rhythm: Normal rate and regular rhythm.     Heart sounds: No murmur heard. Pulmonary:     Effort: Pulmonary effort is normal. No  respiratory distress.     Breath sounds: Normal breath sounds. No wheezing.  Abdominal:     General: Bowel sounds are normal. There is no distension.     Palpations: Abdomen is soft. There is no mass.     Tenderness: There is no abdominal tenderness. There is no guarding or rebound.  Musculoskeletal:        General: Normal range of motion.     Cervical back: Neck supple.  Lymphadenopathy:     Cervical: No cervical adenopathy.  Skin:    General: Skin is warm and dry.     Capillary Refill: Capillary refill takes less than 2 seconds.  Neurological:     Mental Status: She is alert and oriented to person, place, and time.     Deep Tendon Reflexes: Reflexes normal.  Psychiatric:        Mood and Affect: Mood normal.        Behavior: Behavior normal.     Results:  PHQ-9: no depression     Assessment: 40 y.o. G105P0103 female here for routine annual physical examination.  Plan: Problem List Items Addressed This Visit   None Visit Diagnoses     Annual physical exam    -  Primary   Relevant Orders   Comprehensive metabolic panel   Lipid panel   TSH   CBC with Differential   Hot flashes       Relevant Orders   TSH       Screening: -- Blood pressure screen normal -- cholesterol screening: will obtain -- Weight screening: overweight: continue to monitor -- Diabetes Screening: will obtain -- Nutrition: Encouraged healthy diet  The ASCVD Risk score (Arnett DK, et al., 2019) failed to calculate for the following reasons:   Cannot find a previous HDL lab   Cannot find a previous total cholesterol lab  -- Statin therapy for Age 26-75 with CVD risk >7.5%  Psych --  Depression screening (PHQ-9): low risk    Safety -- tobacco screening: not using -- alcohol screening:  low-risk usage. -- no evidence of domestic violence or intimate partner violence.   Cancer Screening -- pap smear not collected per ASCCP guidelines -- family history of breast cancer screening: done.  not at high risk. -- Mammogram -  information provided to schedule -- Colon cancer (age 50+)-- not indicated  Immunizations Immunization History  Administered Date(s) Administered   Influenza,inj,Quad PF,6+ Mos 03/11/2013, 04/16/2015   PFIZER(Purple Top)SARS-COV-2 Vaccination 06/07/2019, 06/28/2019   Rho (D) Immune Globulin 02/18/2012   Td 06/17/2008   Varicella 12/07/2010    -- flu vaccine up to date -- TDAP q10 years up to date -- Covid-19 Vaccine up to date   Encouraged healthy diet and exercise. Encouraged regular vision and dental care.   Lesleigh Noe, MD

## 2020-12-21 ENCOUNTER — Other Ambulatory Visit: Payer: Self-pay | Admitting: Family Medicine

## 2020-12-21 DIAGNOSIS — Z1231 Encounter for screening mammogram for malignant neoplasm of breast: Secondary | ICD-10-CM

## 2021-01-18 ENCOUNTER — Other Ambulatory Visit: Payer: Self-pay | Admitting: General Surgery

## 2021-01-18 DIAGNOSIS — S3981XD Other specified injuries of abdomen, subsequent encounter: Secondary | ICD-10-CM

## 2021-01-19 ENCOUNTER — Ambulatory Visit
Admission: RE | Admit: 2021-01-19 | Discharge: 2021-01-19 | Disposition: A | Discharge status: Home / Self Care | Payer: Managed Care, Other (non HMO) | Source: Ambulatory Visit | Attending: Family Medicine | Admitting: Family Medicine

## 2021-01-19 ENCOUNTER — Other Ambulatory Visit: Payer: Self-pay

## 2021-01-19 DIAGNOSIS — Z1231 Encounter for screening mammogram for malignant neoplasm of breast: Secondary | ICD-10-CM

## 2021-01-27 ENCOUNTER — Ambulatory Visit
Admission: RE | Admit: 2021-01-27 | Discharge: 2021-01-27 | Disposition: A | Discharge status: Home / Self Care | Payer: Managed Care, Other (non HMO) | Source: Ambulatory Visit | Attending: General Surgery | Admitting: General Surgery

## 2021-01-27 ENCOUNTER — Other Ambulatory Visit: Payer: Self-pay

## 2021-01-27 DIAGNOSIS — S3981XD Other specified injuries of abdomen, subsequent encounter: Secondary | ICD-10-CM

## 2021-01-27 MED ORDER — GADOBENATE DIMEGLUMINE 529 MG/ML IV SOLN
13.0000 mL | Freq: Once | INTRAVENOUS | Status: AC | PRN
  Administered 2021-01-27: 13 mL via INTRAVENOUS

## 2021-02-25 ENCOUNTER — Ambulatory Visit: Payer: Managed Care, Other (non HMO) | Admitting: Internal Medicine

## 2021-03-07 ENCOUNTER — Encounter: Payer: Self-pay | Admitting: Nurse Practitioner

## 2021-03-07 ENCOUNTER — Other Ambulatory Visit: Payer: Self-pay

## 2021-03-07 ENCOUNTER — Ambulatory Visit (INDEPENDENT_AMBULATORY_CARE_PROVIDER_SITE_OTHER): Payer: Managed Care, Other (non HMO) | Admitting: Internal Medicine

## 2021-03-07 ENCOUNTER — Ambulatory Visit (INDEPENDENT_AMBULATORY_CARE_PROVIDER_SITE_OTHER): Payer: Managed Care, Other (non HMO) | Admitting: Nurse Practitioner

## 2021-03-07 VITALS — BP 136/76 | HR 90 | Temp 98.3°F | Ht 61.5 in | Wt 164.0 lb

## 2021-03-07 DIAGNOSIS — J45909 Unspecified asthma, uncomplicated: Secondary | ICD-10-CM | POA: Diagnosis not present

## 2021-03-07 LAB — PULMONARY FUNCTION TEST
DL/VA % pred: 142 %
DL/VA: 6.44 ml/min/mmHg/L
DLCO cor % pred: 136 %
DLCO cor: 27.27 ml/min/mmHg
DLCO unc % pred: 136 %
DLCO unc: 27.27 ml/min/mmHg
FEF 25-75 Post: 2 L/sec
FEF 25-75 Pre: 1.71 L/sec
FEF2575-%Change-Post: 16 %
FEF2575-%Pred-Post: 67 %
FEF2575-%Pred-Pre: 57 %
FEV1-%Change-Post: 5 %
FEV1-%Pred-Post: 78 %
FEV1-%Pred-Pre: 73 %
FEV1-Post: 2.16 L
FEV1-Pre: 2.04 L
FEV1FVC-%Change-Post: 0 %
FEV1FVC-%Pred-Pre: 92 %
FEV6-%Change-Post: 5 %
FEV6-%Pred-Post: 84 %
FEV6-%Pred-Pre: 80 %
FEV6-Post: 2.81 L
FEV6-Pre: 2.67 L
FEV6FVC-%Pred-Post: 101 %
FEV6FVC-%Pred-Pre: 101 %
FVC-%Change-Post: 5 %
FVC-%Pred-Post: 82 %
FVC-%Pred-Pre: 78 %
FVC-Post: 2.81 L
FVC-Pre: 2.67 L
Post FEV1/FVC ratio: 77 %
Post FEV6/FVC ratio: 100 %
Pre FEV1/FVC ratio: 76 %
Pre FEV6/FVC Ratio: 100 %
RV % pred: 128 %
RV: 1.9 L
TLC % pred: 98 %
TLC: 4.59 L

## 2021-03-07 LAB — POCT EXHALED NITRIC OXIDE: FeNO level (ppb): 31

## 2021-03-07 MED ORDER — MONTELUKAST SODIUM 10 MG PO TABS: 10.0000 mg | ORAL_TABLET | Freq: Every day | ORAL | 3 refills | Status: DC

## 2021-03-07 MED ORDER — ALBUTEROL SULFATE HFA 108 (90 BASE) MCG/ACT IN AERS: 1.0000 | INHALATION_SPRAY | Freq: Four times a day (QID) | RESPIRATORY_TRACT | 6 refills | Status: DC | PRN

## 2021-03-07 NOTE — Patient Instructions (Addendum)
-  Continue Symbicort 160 2 puffs Twice daily, brush tongue and rinse mouth afterwards.  -Continue albuterol inhaler 1-2 puffs every 6 hours as needed for shortness of breath or wheezing. Take 1-2 puffs 15 minutes before exercise -Continue Zyrtec 10 mg daily as needed for allergies/environmental triggers.   -Singulair 10 mg At bedtime   Asthma Action Plan in place Rinse mouth after inhaled corticosteroid use.  Avoid triggers, when able.  Exercise encouraged. Notify if worsening symptoms upon exertion.  Notify and seek help if symptoms unrelieved by rescue inhaler.   Follow up in 3 months with Dr. Marchelle Gearing, Katie Karalynn Cottone,NP or APP. If symptoms do not improve or worsen, please contact office for sooner follow up or seek emergency care.

## 2021-03-07 NOTE — Progress Notes (Signed)
Full PFT performed today. °

## 2021-03-07 NOTE — Assessment & Plan Note (Signed)
Breathing stable. SOB likely multifactorial given recent surgeries, deconditioning and asthma. PFTs relatively normal with a possible mild airflow obstruction on flow loop and FEV1 78% predicted. FeNO 31 ppb. Added singulair 10 mg At bedtime for asthma/allergies with Eos 300. Continue on Symbicort 160 Twice daily and albuterol as needed. Change to ProAir from Ventolin for insurance approval per pt. Advised albuterol 15 min prior to activity. Plans to resume regular gym activity in January. Advised taking Zyrtec on days when exposed to known triggers (clearning out attic, etc).   Patient Instructions  -Continue Symbicort 160 2 puffs Twice daily, brush tongue and rinse mouth afterwards.  -Continue albuterol inhaler 1-2 puffs every 6 hours as needed for shortness of breath or wheezing. Take 1-2 puffs 15 minutes before exercise -Continue Zyrtec 10 mg daily as needed for allergies/environmental triggers.   -Singulair 10 mg At bedtime   Asthma Action Plan in place Rinse mouth after inhaled corticosteroid use.  Avoid triggers, when able.  Exercise encouraged. Notify if worsening symptoms upon exertion.  Notify and seek help if symptoms unrelieved by rescue inhaler.   Follow up in 3 months with Dr. Marchelle Gearing, Katie Djuan Talton,NP or APP. If symptoms do not improve or worsen, please contact office for sooner follow up or seek emergency care.

## 2021-03-07 NOTE — Patient Instructions (Signed)
Full PFT performed today. °

## 2021-03-07 NOTE — Progress Notes (Signed)
@Patient  ID: , female    DOB: [birth year], 40 y.o.   MRN: [id number]  Chief Complaint  Patient presents with   Follow-up    Asthma is hit or miss most times. Pt states that she feels fine today.     Referring provider: [id number], MD  HPI: 40 year old female, never smoker followed for moderate persistent asthma.  She is a patient of Dr. 41 and was last seen in office on 06/15/2020 by 06/17/2020, NP.  Past medical history significant for ventral hernia status post repair.  TEST/EVENTS:  11/2013 PFTs: Normal pulmonary function testing. 03/07/2021 PFTs: FVC 2.81 (82), FEV1 2.16 (78), ratio 77 (93), TLC 4.59 (98), DLCO 27.27 (136). Very minimal airflow obstruction.   06/10/2020: OV with Parrett, NP. Previous 3 weeks with increased SOB, wheezing, and decreased activity tolerance. Increasing rescue inhaler needs. Prednisone burst. Increase ICS dose.   06/15/2020: OV with Parrett, NP. Follow up after asthma exacerbation. Maintained on Symbicort 160 Twice daily. PFTs scheduled. CXR no acute process. Pulmonary risk assessment for preop clearance (ventral hernia repair) - low moderate risk.   03/07/2021: Today - follow up after PFTs Patient presents today for follow up after PFTs which were relatively normal with a possible mild airflow obstruction and no significant BD. She was last seen in March with an asthma exacerbation and increased Symbicort to 160. She reports feeling better since her last visit and that her breathing has been stable. She does feel that she experiences more shortness of breath with strenuous activity since undergoing her 3 surgeries. She thinks that this is likely related to deconditioning and less her asthma, but does not want to decrease her ICS dose at this time. She occasionally experiences wheezing, which she correlates more so to environmental triggers especially dust versus activity. She denies cough, orthopnea, PND, leg swelling or chest pain. She  continues on Symbicort Twice daily and PRN Zyrtec. She uses her rescue inhaler once a week or less. Her ACT is 20 today. She will be cleared to go back to the gym and be off weight restriction in January. She does continue to walk for exercise in the interim. Overall, she feels well and offers no further complaints.   FeNO 31 ppb  Allergies  Allergen Reactions   Aspirin Shortness Of Breath and Other (See Comments)    Can trigger asthma, told nurse ibuprofen makes her feel odd and only takes one 200mg  tablet if needed    Immunization History  Administered Date(s) Administered   Influenza,inj,Quad PF,6+ Mos 03/11/2013, 04/16/2015   PFIZER(Purple Top)SARS-COV-2 Vaccination 06/07/2019, 06/28/2019   Rho (D) Immune Globulin 02/18/2012   Td 06/17/2008   Varicella 12/07/2010    Past Medical History:  Diagnosis Date   Asthma    Asthma, intermittent    mild   Carpal tunnel syndrome on both sides    with pregnancy   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   Female infertility 04/30/2010   history restored after error with record merge   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 1 each 6   cetirizine (ZYRTEC) 10 MG tablet Take 10 mg by mouth as needed.     docusate sodium (COLACE) 100 MG capsule Take 1 capsule (100 mg total) by mouth daily as needed. 30 capsule 2   ELDERBERRY PO Take 1 each by mouth as needed.      Multiple Vitamin (MULTIVITAMIN WITH MINERALS) TABS tablet Take 1 tablet by mouth daily.     albuterol (VENTOLIN HFA) 108 (90 Base) MCG/ACT inhaler Inhale 2 puffs into the lungs every 6 (six) hours as needed for wheezing or shortness of breath. (Patient not taking: Reported on 03/07/2021) 1 each 5   No facility-administered medications prior to visit.     Review of Systems:   Constitutional: No weight loss or gain, night sweats, fevers, chills, fatigue, or lassitude. HEENT: No headaches, difficulty swallowing, tooth/dental problems, or sore throat. No sneezing, itching, ear ache, nasal congestion, or post nasal drip CV:  No chest pain, orthopnea, PND, swelling in lower extremities, anasarca, dizziness, palpitations, syncope Resp: +shortness of breath with exertion (strenuous exercise); occasional wheeze (noted related to environmental triggers such as dust). No excess mucus or change in color of mucus. No productive or non-productive. No hemoptysis. No chest wall deformity GI:  No heartburn, indigestion, abdominal pain, nausea, vomiting, diarrhea, change in bowel habits, loss of appetite, bloody stools.  GU: No dysuria, change in color of urine, urgency or frequency.  No flank pain, no hematuria  Skin: No rash, lesions, ulcerations MSK:  No joint pain or swelling.  No decreased range of motion.  No back pain. Neuro: No dizziness or lightheadedness.  Psych: No depression or anxiety. Mood stable.     Physical Exam:  BP 136/76 (BP Location: Left Arm, Patient Position: Sitting, Cuff Size: Normal)    Pulse 90    Temp 98.3 F (36.8 C) (Oral)    Ht 5' 1.5" (1.562 m)    Wt 164 lb (74.4 kg)    SpO2 98%    BMI 30.49 kg/m   GEN: Pleasant, interactive, well-nourished; in no acute distress. HEENT:  Normocephalic and atraumatic. EACs patent bilaterally. TM pearly gray with present light reflex bilaterally. PERRLA. Sclera white. Nasal turbinates pink, moist and patent bilaterally. No rhinorrhea  present. Oropharynx pink and moist, without exudate or edema. No lesions, ulcerations, or postnasal drip.  NECK:  Supple w/ fair ROM. No JVD present. Normal carotid impulses w/o bruits. Thyroid symmetrical with no goiter or nodules palpated. No lymphadenopathy.   CV: RRR, no m/r/g, no peripheral edema. Pulses intact, +2 bilaterally. No cyanosis, pallor or clubbing. PULMONARY:  Unlabored, regular breathing. Clear bilaterally A&P w/o wheezes/rales/rhonchi. No accessory muscle use. No dullness to percussion. GI: BS present and normoactive. Soft, non-tender to palpation. No organomegaly or masses detected. No CVA tenderness. MSK: No erythema, warmth or tenderness. Cap refil <2 sec all extrem. No deformities or joint swelling noted.  Neuro: A/Ox3. No focal deficits noted.   Skin: Warm, no lesions or rashe Psych: Normal affect and behavior. Judgement and thought content appropriate.     Lab Results:  CBC    Component Value Date/Time   WBC 3.4 (L) 12/02/2020 0854   RBC 4.69 12/02/2020 0854   HGB 13.8 12/02/2020 0854   HCT 40.9 12/02/2020 0854   PLT 232.0 12/02/2020 0854   MCV 87.2 12/02/2020 0854   MCH  29.3 07/30/2020 1400   MCHC 33.8 12/02/2020 0854   RDW 14.1 12/02/2020 0854   LYMPHSABS 1.1 12/02/2020 0854   MONOABS 0.3 12/02/2020 0854   EOSABS 0.3 12/02/2020 0854   BASOSABS 0.0 12/02/2020 0854    BMET    Component Value Date/Time   NA 136 12/02/2020 0854   K 4.3 12/02/2020 0854   CL 103 12/02/2020 0854   CO2 26 12/02/2020 0854   GLUCOSE 92 12/02/2020 0854   BUN 13 12/02/2020 0854   CREATININE 0.70 12/02/2020 0854   CALCIUM 8.9 12/02/2020 0854    BNP No results found for: BNP   Imaging:  No results found.    PFT Results Latest Ref Rng & Units 03/07/2021 12/01/2013  FVC-Pre L 2.67 3.13  FVC-Predicted Pre % 78 90  FVC-Post L 2.81 3.20  FVC-Predicted Post % 82 92  Pre FEV1/FVC % % 76 77  Post FEV1/FCV % % 77 79  FEV1-Pre L 2.04 2.40  FEV1-Predicted Pre % 73 83   FEV1-Post L 2.16 2.54  DLCO uncorrected ml/min/mmHg 27.27 -  DLCO UNC% % 136 -  DLCO corrected ml/min/mmHg 27.27 -  DLCO COR %Predicted % 136 -  DLVA Predicted % 142 -  TLC L 4.59 -  TLC % Predicted % 98 -  RV % Predicted % 128 -    Lab Results  Component Value Date   NITRICOXIDE 42 10/07/2015        Assessment & Plan:   Asthma, intrinsic Breathing stable. SOB likely multifactorial given recent surgeries, deconditioning and asthma. PFTs relatively normal with a possible mild airflow obstruction on flow loop and FEV1 78% predicted. FeNO 31 ppb. Added singulair 10 mg At bedtime for asthma/allergies with Eos 300. Continue on Symbicort 160 Twice daily and albuterol as needed. Change to ProAir from Ventolin for insurance approval per pt. Advised albuterol 15 min prior to activity. Plans to resume regular gym activity in January. Advised taking Zyrtec on days when exposed to known triggers (clearning out attic, etc).   Patient Instructions  -Continue Symbicort 160 2 puffs Twice daily, brush tongue and rinse mouth afterwards.  -Continue albuterol inhaler 1-2 puffs every 6 hours as needed for shortness of breath or wheezing. Take 1-2 puffs 15 minutes before exercise -Continue Zyrtec 10 mg daily as needed for allergies/environmental triggers.   -Singulair 10 mg At bedtime   Asthma Action Plan in place Rinse mouth after inhaled corticosteroid use.  Avoid triggers, when able.  Exercise encouraged. Notify if worsening symptoms upon exertion.  Notify and seek help if symptoms unrelieved by rescue inhaler.   Follow up in 3 months with Dr. Chase Caller, Katie Mileidy Atkin,NP or APP. If symptoms do not improve or worsen, please contact office for sooner follow up or seek emergency care.    Clayton Bibles, NP 03/07/2021  Pt aware and understands NP's role.

## 2021-04-08 ENCOUNTER — Ambulatory Visit: Payer: Managed Care, Other (non HMO) | Admitting: Internal Medicine

## 2021-06-06 ENCOUNTER — Encounter: Payer: Self-pay | Admitting: Internal Medicine

## 2021-06-06 ENCOUNTER — Ambulatory Visit: Payer: Managed Care, Other (non HMO) | Admitting: Internal Medicine

## 2021-06-06 ENCOUNTER — Other Ambulatory Visit: Payer: Self-pay

## 2021-06-06 VITALS — BP 126/74 | HR 82 | Temp 98.0°F | Ht 61.5 in | Wt 169.0 lb

## 2021-06-06 DIAGNOSIS — J4540 Moderate persistent asthma, uncomplicated: Secondary | ICD-10-CM | POA: Diagnosis not present

## 2021-06-06 NOTE — Progress Notes (Signed)
? ? ? ?OV 04/16/2015 ? ?Chief Complaint  ?Patient presents with  ? Follow-up  ?  Former [facility] pt. Pt states her breathing is doing well. Pt states she only gets SOB if she is exposed to her "normal" triggers - smoke, cold, fragrences. Pt denies cough and CP/tightness.   ? ?41 year old mother of triplets were 45 years old she has a history of asthma and used to be followed by Dr. Jonathon Bellows. She is here for routine one-year follow-up. Background history of asthma since middle school. Last admission for asthma or emergency room visit was several years ago. Last prednisone use was a few years ago. She has cat sensitivity but not to the cat she has in her house but only to cats outside her house. She is on Symbicort for a few years. Compliance is around 50-60 percent. Because of triplet she forgets to take them at night. Overall she is doing well with her asthma. She is no longer running because of her busy life with triplets. Asthma control question score is 0 out of 6. In particular she does not wake up at night because of asthma. When she wakes up in the morning she has no symptoms. She is not limited in her activities because of asthma. She does not expands dyspnea because of asthma. And she has not had any wheeze. She has not used albuterol in the last 1 week. In total she is use albuterol 3 times in the last few months. ? ?Exhaled nitric oxide is 21 ppb and is normal. ? ? ?She has reluctantly agreed to have flu shot  ? ? ?OV 10/07/2015 ? ?Chief Complaint  ?Patient presents with  ? Follow-up  ?  Pt reports she is unable to do any running w/o having to use her rescue inhaler.   ? ? ?Follow-up moderate persistent asthma on Symbicort ? ?Last seen early 2017. This is a routine 6 month follow-up. She says that now she's been running several times a week 3 miles as part of her routine exercise/marathon preparation. She feels that after I reduced his Symbicort dosage based on 50% compliance and normal exhaled nitric oxide, she feels  that she's having increased symptomatology. In fact asthma control question shows score of 1.2 whereas it was 0 before. She feels that her runs of more difficult. This is despite the fact she warms up. She is also saying that she feels the radius Symbicort is responsible despite factoring in the current heat and humidity and taking albuterol before the runs. Otherwise feels fine no problems. ? ? ?OV 08/01/2017 ? ?Chief Complaint  ?Patient presents with  ? Follow-up  ?  Last seen 10/07/15. Pt states she has been doing good and denies any complaints or concerns.  ? ?Follow-up moderate persistent asthma on Symbicort ? ?Last seen July 2017.  She then did not follow-up because she has been doing well.  She made this appointment after she started realizing she was running out of Symbicort and call for a refill in the office asked her to make a follow-up.  She tells me that she is 100% compliant with Symbicort 160/4 0.5 to 2 puffs twice daily.  Her albuterol use is rare.  She works out at [employer] and also does running.  She does high intensity interval training.  For all this she does warm up and cool down as advised her at the previous visit.  She also takes albuterol preexercise and during exercise if needed.  Her asthma control  questionnaire is 0.6 on a five-point scale but all of the symptoms are only at exercise.  At baseline she does not wake up at night and when she wakes up she has no symptoms.  She feels very slightly limited because of her asthma and she is a little short of breath because of asthma and it is totally during sprints and workouts.  She does not wheeze and she uses her albuterol 1 or 2 times a week for high intensity workout only.  I have told her that she would need at least annual follow-up ? ? ? ?OV 10/08/2019 ? ? ?Subjective:  ?Patient ID: Nicole Owens, female , DOB: 03-Nov-1980, age 41 y.o. years. , MRN: [id number],  ADDRESS: [address] ?[place] [address] ?PCP  Patient, No Pcp  Per ?Providers : Treatment Team:  ?Attending Provider: Kalman Shan, MD ? ? ?Chief Complaint  ?Patient presents with  ? Follow-up  ?  No complaints  ? ? ?Moderate persistent asthma on Symbicort ? ? ?HPI ?Nicole Owens 41 y.o. -returns for follow-up.  She continues her Symbicort daily.  Not seen her in 2 years.  She says that she called for a refill and because it has been 2 years the office asked her to make a follow-up visit.  She uses her albuterol for rescue when the weather changes.  She then wakes up in the middle of the night with chest tightness and she needs albuterol.  This happens approximately every 6 weeks or so.  She is only used albuterol for rescue 1 time in the week during the daytime.  She does use albuterol as part of her preexercise regimen.  She feels overall that her asthma is extremely well controlled.  She is willing to go down to the lower dose of Symbicort because she is on the full dose of 160/4.52 puff 2 times daily. ? ? ? ?41 year old female, never smoker followed for moderate persistent asthma.  She is a patient of Dr. Jane Canary and was last seen in office on 06/15/2020 by Clent Ridges, NP.  Past medical history significant for ventral hernia status post repair. ? ?TEST/EVENTS:  ? ? ? ?06/10/2020: OV with Parrett, NP. Previous 3 weeks with increased SOB, wheezing, and decreased activity tolerance. Increasing rescue inhaler needs. Prednisone burst. Increase ICS dose.  ? ?06/15/2020: OV with Parrett, NP. Follow up after asthma exacerbation. Maintained on Symbicort 160 Twice daily. PFTs scheduled. CXR no acute process. Pulmonary risk assessment for preop clearance (ventral hernia repair) - low moderate risk. ? ?03/07/2021: Today - follow up after PFTs ?Patient presents today for follow up after PFTs which were relatively normal with a possible mild airflow obstruction and no significant BD. She was last seen in March with an asthma exacerbation and increased Symbicort to 160. She reports  feeling better since her last visit and that her breathing has been stable. She does feel that she experiences more shortness of breath with strenuous activity since undergoing her 3 surgeries. She thinks that this is likely related to deconditioning and less her asthma, but does not want to decrease her ICS dose at this time. She occasionally experiences wheezing, which she correlates more so to environmental triggers especially dust versus activity. She denies cough, orthopnea, PND, leg swelling or chest pain. She continues on Symbicort Twice daily and PRN Zyrtec. She uses her rescue inhaler once a week or less. Her ACT is 20 today. She will be cleared to go back to the gym and be off  weight restriction in January. She does continue to walk for exercise in the interim. Overall, she feels well and offers no further complaints.  ? ?FeNO 31 ppb ? ? ?11/2013 PFTs: Normal pulmonary function testing. ?03/07/2021 PFTs: FVC 2.81 (82), FEV1 2.16 (78), ratio 77 (93), TLC 4.59 (98), DLCO 27.27 (136). Very minimal airflow obstruction.  ?  ? ?OV 06/06/2021 ? ?Subjective:  ?Patient ID: Nicole Owens, female , DOB: 1980-10-30 , age 41 y.o. , MRN: [id number] , ADDRESS: [address] ?[place] [id number] ?PCP Lynnda Childody, Jessica R, MD ?Patient Care Team: ?Lynnda Childody, Jessica R, MD as PCP - General (Family Medicine) ?Olivia Mackieaavon, Richard, MD as Attending Physician (Obstetrics and Gynecology) ?Parrett, Virgel Bouquetammy S, NP as Nurse Practitioner (Pulmonary Disease) ? ?This Provider for this visit: Treatment Team:  ?Attending Provider: Kalman Shanamaswamy, Justice Milliron, MD ? ? ? ?06/06/2021 -   ?Chief Complaint  ?Patient presents with  ? Follow-up  ?  Pt states she becomes winded carrying things upstairs and winded at other times that she used to not get winded during. Also states that some things she used to be able to do while exercising, she is not able to do as well.  ? ?Moderate persistent asthma on Symbicort.  Not fully compliant with Singulair because of nighttime  schedule ? ?HPI ?Nicole Owens 41 y.o. -returns for follow-up.  I personally not seen her since summer 2021.  She says she is doing "okay".  However she feels that after switching to [employer] based on insurance

## 2021-06-06 NOTE — Patient Instructions (Addendum)
?    ICD-10-CM   1. Asthma, moderate persistent, poorly-controlled  J45.40       Symptoms suggest asthma control is not optimal and noted that singulair schedule is preventing full complianc  plan -Continue Symbicort 160 2 puffs Twice daily, brush tongue and rinse mouth afterwards.  -Continue albuterol inhaler 1-2 puffs every 6 hours as needed for shortness of breath or wheezing. Take 1-2 puffs 15 minutes before exercise -Continue Zyrtec 10 mg daily as needed for allergies/environmental triggers.  -Singulair 10 mg change from bed time to morning  Followup  - 3 months or sooner if needed  - ACT test and feno test at followup; might consider RAST allergy test at followup if poor control continues 

## 2021-07-11 ENCOUNTER — Ambulatory Visit: Payer: Managed Care, Other (non HMO) | Admitting: Family Medicine

## 2021-07-11 VITALS — BP 120/80 | HR 78 | Temp 97.9°F | Wt 172.1 lb

## 2021-07-11 DIAGNOSIS — R200 Anesthesia of skin: Secondary | ICD-10-CM | POA: Diagnosis not present

## 2021-07-11 DIAGNOSIS — R202 Paresthesia of skin: Secondary | ICD-10-CM | POA: Insufficient documentation

## 2021-07-11 DIAGNOSIS — R140 Abdominal distension (gaseous): Secondary | ICD-10-CM

## 2021-07-11 NOTE — Assessment & Plan Note (Signed)
Suspect some possible constipation.  Handout to increase fiber provided to patient.  Symptoms do not resolve we will consider chest x-ray to evaluate further and labs including CMP and CBC.  However given improvement in symptoms will watch and wait for now. ?
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
## 2021-07-11 NOTE — Assessment & Plan Note (Signed)
Exam benign without cause. Given positioning suspect some ulnar nerve. Advised avoiding bending at night. If no improvement or worsening return to see Dr. Patsy Lager ?

## 2021-07-11 NOTE — Patient Instructions (Addendum)
Elbow ?- try to lay at night with your arms straight ?- if no improvement, see Dr. Patsy Lager ? ? ?Constipation  ? ?Constipation is a common issue. Often it is related to diet and occasionally medications.  ? ?What you can do to treat your symptoms ?1) Fiber ?-- Eat more fiber rich foods: beans, broccoli, berries, avocados, popcorn, pear/apple, green peas, turnip greens, brussels sprouts, whole grains (barley, bran, quinoa, oatmeal) ?-- Take a Fiber supplement: Psyllium (Metamucil)  ?-- Could also eat Prunes daily ? ?2) Hydration  ?-- Drink more water: Try to drink 64 oz of water per day ? ?3) Exercise ?-- Moderate exercise (walking, jogging, biking) for 30 minutes, 5 days a week ? ?4) Dedicate time for Bowel movements - do not delay ? ?5) Stool Softener  ?- Docusate Sodium (Colace) 100 mg daily or twice daily as needed ? ?

## 2021-07-13 ENCOUNTER — Encounter: Payer: Self-pay | Admitting: Family Medicine

## 2021-07-21 ENCOUNTER — Encounter: Payer: Self-pay | Admitting: Family Medicine

## 2021-07-21 ENCOUNTER — Other Ambulatory Visit: Payer: Self-pay | Admitting: Internal Medicine

## 2021-08-23 ENCOUNTER — Encounter: Payer: Self-pay | Admitting: Internal Medicine

## 2021-08-23 DIAGNOSIS — J45909 Unspecified asthma, uncomplicated: Secondary | ICD-10-CM

## 2021-08-23 MED ORDER — MONTELUKAST SODIUM 10 MG PO TABS: 10.0000 mg | ORAL_TABLET | Freq: Every day | ORAL | 3 refills | Status: DC

## 2021-09-08 ENCOUNTER — Other Ambulatory Visit: Payer: Self-pay | Admitting: *Deleted

## 2021-09-08 DIAGNOSIS — J4540 Moderate persistent asthma, uncomplicated: Secondary | ICD-10-CM

## 2021-09-08 NOTE — Progress Notes (Signed)
OV 04/16/2015  Chief Complaint  Patient presents with   Follow-up    Former [facility] pt. Pt states her breathing is doing well. Pt states she only gets SOB if she is exposed to her "normal" triggers - smoke, cold, fragrences. Pt denies cough and CP/tightness.    40 year old mother of triplets were 37 years old she has a history of asthma and used to be followed by Dr. Jonathon Bellows. She is here for routine one-year follow-up. Background history of asthma since middle school. Last admission for asthma or emergency room visit was several years ago. Last prednisone use was a few years ago. She has cat sensitivity but not to the cat she has in her house but only to cats outside her house. She is on Symbicort for a few years. Compliance is around 50-60 percent. Because of triplet she forgets to take them at night. Overall she is doing well with her asthma. She is no longer running because of her busy life with triplets. Asthma control question score is 0 out of 6. In particular she does not wake up at night because of asthma. When she wakes up in the morning she has no symptoms. She is not limited in her activities because of asthma. She does not expands dyspnea because of asthma. And she has not had any wheeze. She has not used albuterol in the last 1 week. In total she is use albuterol 3 times in the last few months.  Exhaled nitric oxide is 21 ppb and is normal.   She has reluctantly agreed to have flu shot    OV 10/07/2015  Chief Complaint  Patient presents with   Follow-up    Pt reports she is unable to do any running w/o having to use her rescue inhaler.     Follow-up moderate persistent asthma on Symbicort  Last seen early 2017. This is a routine 6 month follow-up. She says that now she's been running several times a week 3 miles as part of her routine exercise/marathon preparation. She feels that after I reduced his Symbicort dosage based on 50% compliance and normal exhaled nitric oxide, she feels  that she's having increased symptomatology. In fact asthma control question shows score of 1.2 whereas it was 0 before. She feels that her runs of more difficult. This is despite the fact she warms up. She is also saying that she feels the radius Symbicort is responsible despite factoring in the current heat and humidity and taking albuterol before the runs. Otherwise feels fine no problems.   OV 08/01/2017  Chief Complaint  Patient presents with   Follow-up    Last seen 10/07/15. Pt states she has been doing good and denies any complaints or concerns.   Follow-up moderate persistent asthma on Symbicort  Last seen July 2017.  She then did not follow-up because she has been doing well.  She made this appointment after she started realizing she was running out of Symbicort and call for a refill in the office asked her to make a follow-up.  She tells me that she is 100% compliant with Symbicort 160/4 0.5 to 2 puffs twice daily.  Her albuterol use is rare.  She works out at [employer] and also does running.  She does high intensity interval training.  For all this she does warm up and cool down as advised her at the previous visit.  She also takes albuterol preexercise and during exercise if needed.  Her asthma control  questionnaire is 0.6 on a five-point scale but all of the symptoms are only at exercise.  At baseline she does not wake up at night and when she wakes up she has no symptoms.  She feels very slightly limited because of her asthma and she is a little short of breath because of asthma and it is totally during sprints and workouts.  She does not wheeze and she uses her albuterol 1 or 2 times a week for high intensity workout only.  I have told her that she would need at least annual follow-up    OV 10/08/2019   Subjective:  Patient ID: Nicole Owens, female , DOB: 05-06-1980, age 33 y.o. years. , MRN: [id number],  ADDRESS: [address] [place] [address] PCP  Patient, No Pcp  Per Providers : Treatment Team:  Attending Provider: Kalman Shan, MD   Chief Complaint  Patient presents with   Follow-up    No complaints    Moderate persistent asthma on Symbicort   HPI Nicole Owens 41 y.o. -returns for follow-up.  She continues her Symbicort daily.  Not seen her in 2 years.  She says that she called for a refill and because it has been 2 years the office asked her to make a follow-up visit.  She uses her albuterol for rescue when the weather changes.  She then wakes up in the middle of the night with chest tightness and she needs albuterol.  This happens approximately every 6 weeks or so.  She is only used albuterol for rescue 1 time in the week during the daytime.  She does use albuterol as part of her preexercise regimen.  She feels overall that her asthma is extremely well controlled.  She is willing to go down to the lower dose of Symbicort because she is on the full dose of 160/4.52 puff 2 times daily.    41 year old female, never smoker followed for moderate persistent asthma.  She is a patient of Dr. Jane Canary and was last seen in office on 06/15/2020 by Clent Ridges, NP.  Past medical history significant for ventral hernia status post repair.  TEST/EVENTS:     06/10/2020: Sudie Grumbling with Parrett, NP. Previous 3 weeks with increased SOB, wheezing, and decreased activity tolerance. Increasing rescue inhaler needs. Prednisone burst. Increase ICS dose.   06/15/2020: OV with Parrett, NP. Follow up after asthma exacerbation. Maintained on Symbicort 160 Twice daily. PFTs scheduled. CXR no acute process. Pulmonary risk assessment for preop clearance (ventral hernia repair) - low moderate risk.  03/07/2021: Today - follow up after PFTs Patient presents today for follow up after PFTs which were relatively normal with a possible mild airflow obstruction and no significant BD. She was last seen in March with an asthma exacerbation and increased Symbicort to 160. She reports  feeling better since her last visit and that her breathing has been stable. She does feel that she experiences more shortness of breath with strenuous activity since undergoing her 3 surgeries. She thinks that this is likely related to deconditioning and less her asthma, but does not want to decrease her ICS dose at this time. She occasionally experiences wheezing, which she correlates more so to environmental triggers especially dust versus activity. She denies cough, orthopnea, PND, leg swelling or chest pain. She continues on Symbicort Twice daily and PRN Zyrtec. She uses her rescue inhaler once a week or less. Her ACT is 20 today. She will be cleared to go back to the gym and be off  weight restriction in January. She does continue to walk for exercise in the interim. Overall, she feels well and offers no further complaints.   FeNO 31 ppb   11/2013 PFTs: Normal pulmonary function testing. 03/07/2021 PFTs: FVC 2.81 (82), FEV1 2.16 (78), ratio 77 (93), TLC 4.59 (98), DLCO 27.27 (136). Very minimal airflow obstruction.     OV 06/06/2021  Subjective:  Patient ID: Nicole Owens, female , DOB: Mar 27, 1980 , age 69 y.o. , MRN: [id number] , ADDRESS: [address] [place] [address] PCP Lynnda Child, MD Patient Care Team: Lynnda Child, MD as PCP - General (Family Medicine) Olivia Mackie, MD as Attending Physician (Obstetrics and Gynecology) Parrett, Virgel Bouquet, NP as Nurse Practitioner (Pulmonary Disease)  This Provider for this visit: Treatment Team:  Attending Provider: Kalman Shan, MD    06/06/2021 -   Chief Complaint  Patient presents with   Follow-up    Pt states she becomes winded carrying things upstairs and winded at other times that she used to not get winded during. Also states that some things she used to be able to do while exercising, she is not able to do as well.   Moderate persistent asthma on Symbicort.  Not fully compliant with Singulair because of nighttime  schedule  HPI Nicole Owens 41 y.o. -returns for follow-up.  I personally not seen her since summer 2021.  She says she is doing "okay".  However she feels that after switching to ProAir based on insurance reasons instead of Ventolin her asthma is not as good as before.  However when I spoke to her I found out that she is not taking her Singulair as directed because she finds it inconvenient to do it at nighttime.  She says she sleeps well but in the daytime is using albuterol at least 2 or 3 times a week.  In addition she is having to do albuterol preexercise.  She does exercise a lot at burn [facility].  She says in 2022 she had 2 visits with nurse practitioners both acute low she did not get steroids.  1 in March 2022 and then again later in 2022.  She also had some surgeries in 2022 namely labrum repair, inguinal hernia repair and sports hernia repair.  She did have COVID in the summer 2022.  However there is no active wheezing or nocturnal symptoms.  ACT score has dropped to 17 today.   Asthma Control Test ACT Total Score  06/06/2021 17  03/07/2021 20  06/15/2020 23     Lab Results  Component Value Date   NITRICOXIDE 42 10/07/2015       OV 09/09/2021  Subjective:  Patient ID: Nicole Owens, female , DOB: 1980/06/12 , age 87 y.o. , MRN: [id number] , ADDRESS: [address] [place] [address] PCP Lynnda Child, MD Patient Care Team: Lynnda Child, MD as PCP - General (Family Medicine) Olivia Mackie, MD as Attending Physician (Obstetrics and Gynecology) Parrett, Virgel Bouquet, NP as Nurse Practitioner (Pulmonary Disease)  This Provider for this visit: Treatment Team:  Attending Provider: Kalman Shan, MD    09/09/2021 -   Chief Complaint  Patient presents with   Follow-up    PT states her SOB has been increased the last couple morning, some wheezing   Moderate persistent asthma on Symbicort and singular HPI Nicole Owens 41 y.o. -last visit March 2023.  At  that visit we asked for increase Singulair compliance which she says  she is compliant.  She is also taking as Symbicort on regular basis.  She tells me that with this she thought her asthma was better controlled.  However she feels the higher dose Symbicort is caused some weight gain.  She also tells me that she ran out of Singulair a few days ago and during this time when she stopped it some abdominal bloating that she had resolved.  Therefore she thinks the Singulair was causing this.  She is back on Singulair for the last few to several days.  This does not cause the return of the abdominal bloating but she is worried that this could return.  She continues on Zyrtec.  She also continues her burn [facility].  She is willing to get RAST allergy panel and blood IgE checked with also blood C. difficile for eosinophils.     Asthma Control Panel 04/16/15 10/07/2015  08/01/2017  05/2920 and 12/19/233 06/06/2021  09/09/2021   Current Med Regimen   symbicort at 50% compliance symbicort 2puff once dail symbicort 160 at 2 puff bid  Symbicort but not taking Singulair because of nighttime inconvenience Symbicort and sigulair  ACT    20 -> 23 17 18   ACQ 5 point- 1 week. wtd avg score. <1.0 is good control 0.75-1.25 is grey zone. >1.25 poor control. Delta 0.5 is clinically meaningful 0 1.2 0.6 and only with exdrcise     FeNO ppB 21ppb 42 25 ppb   28 ppb  FeV1         Planned intervention  for visit  Increase symbi Continue symbi        Asthma Control Test ACT Total Score  09/09/2021  9:45 AM 18  06/06/2021  2:19 PM 17  03/07/2021  2:54 PM 20     PFT     Latest Ref Rng & Units 03/07/2021    1:56 PM 12/01/2013    3:58 PM  PFT Results  FVC-Pre L 2.67  3.13   FVC-Predicted Pre % 78  90   FVC-Post L 2.81  3.20   FVC-Predicted Post % 82  92   Pre FEV1/FVC % % 76  77   Post FEV1/FCV % % 77  79   FEV1-Pre L 2.04  2.40   FEV1-Predicted Pre % 73  83   FEV1-Post L 2.16  2.54   DLCO uncorrected  ml/min/mmHg 27.27    DLCO UNC% % 136    DLCO corrected ml/min/mmHg 27.27    DLCO COR %Predicted % 136    DLVA Predicted % 142    TLC L 4.59    TLC % Predicted % 98    RV % Predicted % 128         has a past medical history of Asthma, Asthma, intermittent, Carpal tunnel syndrome on both sides, 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   reports that she has never smoked. She has never used smokeless tobacco.  Past Surgical History:  Procedure Laterality Date   CESAREAN SECTION  02/16/2012   Procedure: CESAREAN SECTION;  Surgeon: Lenoard Aden, MD;  Location: [facility];  Service: Obstetrics;  Laterality: N/A;   HERNIA REPAIR  06/17/2020   hernia repair with mesh and tummy tuck   HIP ARTHROSCOPY Right 08/03/2020   Procedure: Rt hip arthroscopic labrum repair;  Surgeon:  Yolonda Kida, MD;  Location: [facility] OR;  Service: Orthopedics;  Laterality: Right;   WISDOM TOOTH EXTRACTION      Allergies  Allergen Reactions   Aspirin Shortness Of Breath and Other (See Comments)    Can trigger asthma, told nurse ibuprofen makes her feel odd and only takes one 200mg  tablet if needed    Immunization History  Administered Date(s) Administered   Influenza,inj,Quad PF,6+ Mos 03/11/2013, 04/16/2015   PFIZER(Purple Top)SARS-COV-2 Vaccination 06/07/2019, 06/28/2019   Rho (D) Immune Globulin 02/18/2012   Td 06/17/2008   Varicella 12/07/2010    Family History  Problem Relation Age of Onset   Hypertension Father    Diabetes Father    Obesity Sister    Heart attack Maternal Grandfather    Stroke Paternal Grandfather      Current Outpatient Medications:    albuterol (PROAIR HFA) 108 (90 Base) MCG/ACT  inhaler, Inhale 1-2 puffs into the lungs every 6 (six) hours as needed for wheezing or shortness of breath. 1-2 puffs 15 min prior to exercise., Disp: 8 g, Rfl: 6   cetirizine (ZYRTEC) 10 MG tablet, Take 10 mg by mouth as needed., Disp: , Rfl:    ELDERBERRY PO, Take 1 each by mouth as needed., Disp: , Rfl:    montelukast (SINGULAIR) 10 MG tablet, Take 1 tablet (10 mg total) by mouth at bedtime., Disp: 30 tablet, Rfl: 3   Multiple Vitamin (MULTIVITAMIN WITH MINERALS) TABS tablet, Take 1 tablet by mouth daily., Disp: , Rfl:    SYMBICORT 160-4.5 MCG/ACT inhaler, Inhale 2 puffs by mouth twice daily, Disp: 11 g, Rfl: 5      Objective:   Vitals:   09/09/21 0940  BP: 130/82  Pulse: 72  SpO2: 97%  Weight: 173 lb 3.2 oz (78.6 kg)  Height: 5\' 2"  (1.575 m)    Estimated body mass index is 31.68 kg/m as calculated from the following:   Height as of this encounter: 5\' 2"  (1.575 m).   Weight as of this encounter: 173 lb 3.2 oz (78.6 kg).  @WEIGHTCHANGE @  [employer]   09/09/21 0940  Weight: 173 lb 3.2 oz (78.6 kg)     Physical Exam    General: No distress. Looks well Neuro: Alert and Oriented x 3. GCS 15. Speech normal Psych: Pleasant Resp:  Barrel Chest - no.  Wheeze - no, Crackles - no, No overt respiratory distress CVS: Normal heart sounds. Murmurs - no Ext: Stigmata of Connective Tissue Disease - no HEENT: Normal upper airway. PEERL +. No post nasal drip        Assessment:       ICD-10-CM   1. Asthma, moderate persistent, poorly-controlled  J45.40          Plan:     Patient Instructions     ICD-10-CM   1. Asthma, moderate persistent, poorly-controlled  J45.40       Symptoms suggest asthma control is better  but might NOT be optimal   - singulair helping but seems to have caused bloating and constipation in past  - symbicort high dose helping but seems to be causing weight gain   Plan - check RAST allergy panel and blood IgE  - to see if other agents can  work -[employer]  but reduce to 80/4.5  2 puffs Twice daily - , brush tongue and rinse mouth afterwards.  - monitor control and weight gain -Continue albuterol inhaler 1-2 puffs every 6 hours as needed for shortness of breath or  wheezing. Take 1-2 puffs 15 minutes before exercise -Continue Zyrtec 10 mg daily as needed for allergies/environmental triggers.  -Continue Singulair 10 mg daily  - monitor for gi symptoms - call if recurs  Followup  - 3 months or sooner if needed  - ACT test and feno test at followup    SIGNATURE    Dr. Kalman Shan, M.D., F.C.C.P,  Pulmonary and Critical Care Medicine Staff Physician, [facility] Center Director - Interstitial Lung Disease  Program  Pulmonary Fibrosis [facility] at [facility] [place], [place], [address]  Pager: [phone], If no answer or between  15:00h - 7:00h: call [phone] Telephone: [phone]  10:23 AM 09/09/2021

## 2021-09-08 NOTE — Patient Instructions (Signed)
    ICD-10-CM   1. Asthma, moderate persistent, poorly-controlled  J45.40       Symptoms suggest asthma control is not optimal and noted that singulair schedule is preventing full complianc  plan -Continue Symbicort 160 2 puffs Twice daily, brush tongue and rinse mouth afterwards.  -Continue albuterol inhaler 1-2 puffs every 6 hours as needed for shortness of breath or wheezing. Take 1-2 puffs 15 minutes before exercise -Continue Zyrtec 10 mg daily as needed for allergies/environmental triggers.  -Singulair 10 mg change from bed time to morning  Followup  - 3 months or sooner if needed  - ACT test and feno test at followup; might consider RAST allergy test at followup if poor control continues

## 2021-09-09 ENCOUNTER — Ambulatory Visit: Payer: Managed Care, Other (non HMO) | Admitting: Internal Medicine

## 2021-09-09 ENCOUNTER — Telehealth: Payer: Self-pay | Admitting: Internal Medicine

## 2021-09-09 ENCOUNTER — Encounter: Payer: Self-pay | Admitting: Internal Medicine

## 2021-09-09 VITALS — BP 130/82 | HR 72 | Ht 62.0 in | Wt 173.2 lb

## 2021-09-09 DIAGNOSIS — J4540 Moderate persistent asthma, uncomplicated: Secondary | ICD-10-CM

## 2021-09-09 LAB — NITRIC OXIDE: Nitric Oxide: 28

## 2021-09-12 LAB — RESPIRATORY ALLERGY PROFILE REGION II ~~LOC~~
Allergen, A. alternata, m6: <0.1 kU/L
Allergen, Cedar tree, t12: <0.1 kU/L
Allergen, Comm Silver Birch, t9: <0.1 kU/L
Allergen, Cottonwood, t14: <0.1 kU/L
Allergen, D pternoyssinus,d7: 0.27 kU/L — ABNORMAL HIGH
Allergen, Mouse Urine Protein, e78: <0.1 kU/L
Allergen, Mulberry, t76: <0.1 kU/L
Allergen, Oak,t7: <0.1 kU/L
Allergen, P. notatum, m1: <0.1 kU/L
Aspergillus fumigatus, m3: <0.1 kU/L
Bermuda Grass: <0.1 kU/L
Box Elder IgE: <0.1 kU/L
CLADOSPORIUM HERBARUM (M2) IGE: <0.1 kU/L
COMMON RAGWEED (SHORT) (W1) IGE: <0.1 kU/L
Cat Dander: 61.7 kU/L — ABNORMAL HIGH
Class: 0
Class: 0
Class: 0
Class: 0
Class: 0
Class: 0
Class: 0
Class: 0
Class: 0
Class: 0
Class: 0
Class: 0
Class: 0
Class: 0
Class: 0
Class: 0
Class: 0
Class: 0
Class: 0
Class: 2
Class: 5
Cockroach: <0.1 kU/L
D. farinae: 0.33 kU/L — ABNORMAL HIGH
Dog Dander: 2.56 kU/L — ABNORMAL HIGH
Elm IgE: <0.1 kU/L
IgE (Immunoglobulin E), Serum: 230 kU/L — ABNORMAL HIGH (ref <=114)
Johnson Grass: <0.1 kU/L
Pecan/Hickory Tree IgE: <0.1 kU/L
Rough Pigweed  IgE: <0.1 kU/L
Sheep Sorrel IgE: <0.1 kU/L
Timothy Grass: 0.2 kU/L — ABNORMAL HIGH

## 2021-09-12 LAB — INTERPRETATION:

## 2021-09-13 NOTE — Telephone Encounter (Signed)
Attempted to call pt but unable to reach. Left message for her to return call.  Will order lab after discussing this with pt.

## 2021-09-14 NOTE — Addendum Note (Signed)
Addended by: Maurene Capes on: 09/14/2021 04:13 PM   Modules accepted: Orders

## 2021-09-15 ENCOUNTER — Other Ambulatory Visit
Admission: RE | Admit: 2021-09-15 | Discharge: 2021-09-15 | Disposition: A | Discharge status: Home / Self Care | Payer: Managed Care, Other (non HMO) | Attending: Internal Medicine | Admitting: Internal Medicine

## 2021-09-15 DIAGNOSIS — J45909 Unspecified asthma, uncomplicated: Secondary | ICD-10-CM | POA: Insufficient documentation

## 2021-09-15 LAB — CBC WITH DIFFERENTIAL/PLATELET
Abs Immature Granulocytes: 0.01 10*3/uL (ref 0.00–0.07)
Basophils Absolute: 0 10*3/uL (ref 0.0–0.1)
Basophils Relative: 1 %
Eosinophils Absolute: 0.5 10*3/uL (ref 0.0–0.5)
Eosinophils Relative: 10 %
HCT: 39.8 % (ref 36.0–46.0)
Hemoglobin: 13.5 g/dL (ref 12.0–15.0)
Immature Granulocytes: 0 %
Lymphocytes Relative: 33 %
Lymphs Abs: 1.5 10*3/uL (ref 0.7–4.0)
MCH: 29.8 pg (ref 26.0–34.0)
MCHC: 33.9 g/dL (ref 30.0–36.0)
MCV: 87.9 fL (ref 80.0–100.0)
Monocytes Absolute: 0.4 10*3/uL (ref 0.1–1.0)
Monocytes Relative: 8 %
Neutro Abs: 2.2 10*3/uL (ref 1.7–7.7)
Neutrophils Relative %: 48 %
Platelets: 216 10*3/uL (ref 150–400)
RBC: 4.53 MIL/uL (ref 3.87–5.11)
RDW: 12.5 % (ref 11.5–15.5)
WBC: 4.5 10*3/uL (ref 4.0–10.5)
nRBC: 0 % (ref 0.0–0.2)

## 2021-09-15 NOTE — Telephone Encounter (Signed)
Called and spoke with pt about message from MR. Pt said she just went to have lab test done. Saw in chart where this had been taken care of. Nothing further needed.

## 2021-10-11 ENCOUNTER — Ambulatory Visit: Payer: Managed Care, Other (non HMO) | Admitting: Family Medicine

## 2021-10-11 VITALS — BP 128/86 | HR 80 | Temp 97.6°F | Ht 62.0 in | Wt 172.4 lb

## 2021-10-11 DIAGNOSIS — R140 Abdominal distension (gaseous): Secondary | ICD-10-CM

## 2021-10-11 DIAGNOSIS — K5900 Constipation, unspecified: Secondary | ICD-10-CM

## 2021-10-11 DIAGNOSIS — R197 Diarrhea, unspecified: Secondary | ICD-10-CM | POA: Diagnosis not present

## 2021-10-11 DIAGNOSIS — R5383 Other fatigue: Secondary | ICD-10-CM | POA: Diagnosis not present

## 2021-10-11 LAB — COMPREHENSIVE METABOLIC PANEL
ALT: 10 U/L (ref 0–35)
AST: 12 U/L (ref 0–37)
Albumin: 4.3 g/dL (ref 3.5–5.2)
Alkaline Phosphatase: 54 U/L (ref 39–117)
BUN: 13 mg/dL (ref 6–23)
CO2: 27 mEq/L (ref 19–32)
Calcium: 8.9 mg/dL (ref 8.4–10.5)
Chloride: 105 mEq/L (ref 96–112)
Creatinine, Ser: 0.69 mg/dL (ref 0.40–1.20)
GFR: 107.5 mL/min (ref >60.00)
Glucose, Bld: 79 mg/dL (ref 70–99)
Potassium: 4.2 mEq/L (ref 3.5–5.1)
Sodium: 138 mEq/L (ref 135–145)
Total Bilirubin: 0.7 mg/dL (ref 0.2–1.2)
Total Protein: 7.2 g/dL (ref 6.0–8.3)

## 2021-10-11 LAB — VITAMIN B12: Vitamin B-12: 215 pg/mL (ref 211–911)

## 2021-10-11 LAB — MAGNESIUM: Magnesium: 2.1 mg/dL (ref 1.5–2.5)

## 2021-10-11 LAB — TSH: TSH: 0.97 u[IU]/mL (ref 0.35–5.50)

## 2021-10-11 LAB — VITAMIN D 25 HYDROXY (VIT D DEFICIENCY, FRACTURES): VITD: 23.44 ng/mL — ABNORMAL LOW (ref 30.00–100.00)

## 2021-10-11 LAB — FERRITIN: Ferritin: 17.2 ng/mL (ref 10.0–291.0)

## 2021-10-11 NOTE — Assessment & Plan Note (Signed)
Patient notes generalized fatigue, worse in the setting of her abdominal symptoms.  Discussed getting blood work to evaluate for possible causes.

## 2021-10-11 NOTE — Patient Instructions (Addendum)
Trials 1) Low Fodmap 2) Omeprazole trial 3) GI referral   Constipation   Constipation is a common issue. Often it is related to diet and occasionally medications.    What you can do to treat your symptoms 1) Fiber -- Eat more fiber rich foods: beans, broccoli, berries, avocados, popcorn, pear/apple, green peas, turnip greens, brussels sprouts, whole grains (barley, bran, quinoa, oatmeal) -- Take a Fiber supplement: Psyllium (Metamucil)  -- Could also eat Prunes daily  2) Hydration  -- Drink more water: Try to drink 64 oz of water per day  3) Exercise -- Moderate exercise (walking, jogging, biking) for 30 minutes, 5 days a week  4) Dedicate time for Bowel movements - do not delay  5) Stool Softener  - Docusate Sodium (Colace) 100 mg daily or twice daily as needed   If 4-6 weeks have passed and the above has not helped then start the following 6) Laxatives -- Polyethylene Glycol (Miralax) - begin with once daily. After a few days can increase to twice daily Or -- Magnesium Citrate -- Common side effect is nausea and diarrhea -- can try if still not improved  If still not improved after 4-6 weeks 7) Stimulant Laxatives: can also be an option if doing above treatment and no bowel movement for 3 days --- Bisacodyl (Dulcolax) 5-15 mg daily --- Sennosides (Senokot)  Treating chronic constipation is often about finding the right amount of medication and fiber to keep you regular and comfortable. For some people that may be daily metamucil and colace every other day. For others it may be Metamucil and colace twice daily and Miralax 3 times a week. The goal is to go slow and listen to your body. And normal can be anywhere from 2-3 soft bowel movements a day to 1 bowel movement every 2-3 days.     Low-FODMAP Eating Plan  FODMAP stands for fermentable oligosaccharides, disaccharides, monosaccharides, and polyols. These are sugars that are hard for some people to digest. A low-FODMAP  eating plan may help some people who have irritable bowel syndrome (IBS) and certain other bowel (intestinal) diseases to manage their symptoms. This meal plan can be complicated to follow. Work with a diet and nutrition specialist (dietitian) to make a low-FODMAP eating plan that is right for you. A dietitian can help make sure that you get enough nutrition from this diet. What are tips for following this plan? Reading food labels Check labels for hidden FODMAPs such as: High-fructose syrup. Honey. Agave. Natural fruit flavors. Onion or garlic powder. Choose low-FODMAP foods that contain 3-4 grams of fiber per serving. Check food labels for serving sizes. Eat only one serving at a time to make sure FODMAP levels stay low. Shopping Shop with a list of foods that are recommended on this diet and make a meal plan. Meal planning Follow a low-FODMAP eating plan for up to 6 weeks, or as told by your health care provider or dietitian. To follow the eating plan: Eliminate high-FODMAP foods from your diet completely. Choose only low-FODMAP foods to eat. You will do this for 2-6 weeks. Gradually reintroduce high-FODMAP foods into your diet one at a time. Most people should wait a few days before introducing the next new high-FODMAP food into their meal plan. Your dietitian can recommend how quickly you may reintroduce foods. Keep a daily record of what and how much you eat and drink. Make note of any symptoms that you have after eating. Review your daily record with a dietitian  regularly to identify which foods you can eat and which foods you should avoid. General tips Drink enough fluid each day to keep your urine pale yellow. Avoid processed foods. These often have added sugar and may be high in FODMAPs. Avoid most dairy products, whole grains, and sweeteners. Work with a dietitian to make sure you get enough fiber in your diet. Avoid high FODMAP foods at meals to manage symptoms. Recommended  foods Fruits Bananas, oranges, tangerines, lemons, limes, blueberries, raspberries, strawberries, grapes, cantaloupe, honeydew melon, kiwi, papaya, passion fruit, and pineapple. Limited amounts of dried cranberries, banana chips, and shredded coconut. Vegetables Eggplant, zucchini, cucumber, peppers, green beans, bean sprouts, lettuce, arugula, kale, Swiss chard, spinach, collard greens, bok choy, summer squash, potato, and tomato. Limited amounts of corn, carrot, and sweet potato. Green parts of scallions. Grains Gluten-free grains, such as rice, oats, buckwheat, quinoa, corn, polenta, and millet. Gluten-free pasta, bread, or cereal. Rice noodles. Corn tortillas. Meats and other proteins Unseasoned beef, pork, poultry, or fish. Eggs. Tomasa Blase. Tofu (firm) and tempeh. Limited amounts of nuts and seeds, such as almonds, walnuts, [place] nuts, pecans, peanuts, nut butters, pumpkin seeds, chia seeds, and sunflower seeds. Dairy Lactose-free milk, yogurt, and kefir. Lactose-free cottage cheese and ice cream. Non-dairy milks, such as almond, coconut, hemp, and rice milk. Non-dairy yogurt. Limited amounts of goat cheese, brie, mozzarella, parmesan, swiss, and other hard cheeses. Fats and oils Butter-free spreads. Vegetable oils, such as olive, canola, and sunflower oil. Seasoning and other foods Artificial sweeteners with names that do not end in "ol," such as aspartame, saccharine, and stevia. Maple syrup, white table sugar, raw sugar, brown sugar, and molasses. Mayonnaise, soy sauce, and tamari. Fresh basil, coriander, parsley, rosemary, and thyme. Beverages Water and mineral water. Sugar-sweetened soft drinks. Small amounts of orange juice or cranberry juice. Black and green tea. Most dry wines. Coffee. The items listed above may not be a complete list of foods and beverages you can eat. Contact a dietitian for more information. Foods to avoid Fruits Fresh, dried, and juiced forms of apple, pear,  watermelon, peach, plum, cherries, apricots, blackberries, boysenberries, figs, nectarines, and mango. Avocado. Vegetables Chicory root, artichoke, asparagus, cabbage, snow peas, Brussels sprouts, broccoli, sugar snap peas, mushrooms, celery, and cauliflower. Onions, garlic, leeks, and the white part of scallions. Grains Wheat, including kamut, durum, and semolina. Barley and bulgur. Couscous. Wheat-based cereals. Wheat noodles, bread, crackers, and pastries. Meats and other proteins Fried or fatty meat. Sausage. Cashews and pistachios. Soybeans, baked beans, black beans, chickpeas, kidney beans, fava beans, navy beans, lentils, black-eyed peas, and split peas. Dairy Milk, yogurt, ice cream, and soft cheese. Cream and sour cream. Milk-based sauces. Custard. Buttermilk. Soy milk. Seasoning and other foods Any sugar-free gum or candy. Foods that contain artificial sweeteners such as sorbitol, mannitol, isomalt, or xylitol. Foods that contain honey, high-fructose corn syrup, or agave. Bouillon, vegetable stock, beef stock, and chicken stock. Garlic and onion powder. Condiments made with onion, such as hummus, chutney, pickles, relish, salad dressing, and salsa. Tomato paste. Beverages Chicory-based drinks. Coffee substitutes. Chamomile tea. Fennel tea. Sweet or fortified wines such as port or sherry. Diet soft drinks made with isomalt, mannitol, maltitol, sorbitol, or xylitol. Apple, pear, and mango juice. Juices with high-fructose corn syrup. The items listed above may not be a complete list of foods and beverages you should avoid. Contact a dietitian for more information. Summary FODMAP stands for fermentable oligosaccharides, disaccharides, monosaccharides, and polyols. These are sugars that are hard for some people  to digest. A low-FODMAP eating plan is a short-term diet that helps to ease symptoms of certain bowel diseases. The eating plan usually lasts up to 6 weeks. After that, high-FODMAP foods  are reintroduced gradually and one at a time. This can help you find out which foods may be causing symptoms. A low-FODMAP eating plan can be complicated. It is best to work with a dietitian who has experience with this type of plan. This information is not intended to replace advice given to you by your health care provider. Make sure you discuss any questions you have with your health care provider. Document Revised: 07/24/2019 Document Reviewed: 07/24/2019 Elsevier Patient Education  2023 [employer].

## 2021-10-11 NOTE — Progress Notes (Signed)
Subjective:     Nicole Owens is a 41 y.o. female presenting for GI Problem (Bloating, L lower abdominal pain, fatigue)     HPI  #Abdominal bloating - inconsistent - ate 3 salads last week - had large amounts of diarrhea - will keep building back up and then will have BM  - does feel she has some constipation - endorses ongoing fatigue - symptoms will last for days at a time - will have days where nothing seems to fit right - feels her abdomen is distended - in one day will be able to wear a small shirt and long shirt - will swing in sizes over the course of a week  Hernia repair last year - notes appetite has not really returned Will eat, but doesn't want to eat most day Will eat a normal meal sometimes and feel sick to her stomach  Has been working with a nurse with insurance for weight management If she eats small portions will feel better  No increased gas or belching  Endorses feeling hot  Will take a tums after over eating and it will help occasionally Not sure about heartburn   Review of Systems   Social History   Tobacco Use  Smoking Status Never  Smokeless Tobacco Never        Objective:    BP Readings from Last 3 Encounters:  10/11/21 128/86  09/09/21 130/82  07/11/21 120/80   Wt Readings from Last 3 Encounters:  10/11/21 172 lb 6 oz (78.2 kg)  09/09/21 173 lb 3.2 oz (78.6 kg)  07/11/21 172 lb 2 oz (78.1 kg)    BP 128/86   Pulse 80   Temp 97.6 F (36.4 C) (Temporal)   Ht 5\' 2"  (1.575 m)   Wt 172 lb 6 oz (78.2 kg)   LMP 10/09/2021 (Exact Date)   SpO2 98%   BMI 31.53 kg/m    Physical Exam Constitutional:      General: She is not in acute distress.    Appearance: She is well-developed. She is not diaphoretic.  HENT:     Right Ear: External ear normal.     Left Ear: External ear normal.     Nose: Nose normal.  Eyes:     Conjunctiva/sclera: Conjunctivae normal.  Cardiovascular:     Rate and Rhythm: Normal rate and regular  rhythm.     Heart sounds: No murmur heard. Pulmonary:     Effort: Pulmonary effort is normal. No respiratory distress.     Breath sounds: Normal breath sounds. No wheezing.  Abdominal:     General: Abdomen is flat. Bowel sounds are normal. There is no distension.     Palpations: Abdomen is soft.     Tenderness: There is abdominal tenderness in the epigastric area. There is no guarding or rebound. Negative signs include Murphy's sign.  Musculoskeletal:     Cervical back: Neck supple.  Skin:    General: Skin is warm and dry.     Capillary Refill: Capillary refill takes less than 2 seconds.  Neurological:     Mental Status: She is alert. Mental status is at baseline.  Psychiatric:        Mood and Affect: Mood normal.        Behavior: Behavior normal.           Assessment & Plan:   Problem List Items Addressed This Visit       Other   Bloating    Persistent and  recurrent symptoms, associated with both constipation and diarrhea.  We will get labs to evaluate for thyroid and celiac.  She also notes some early fullness question for poor emptying.  Suspect this may be IBS, handout for low FODMAP diet provided.  Patient is highly interested in natural approach, discussed we could try omeprazole or Reglan if symptoms or not improved given some epigastric tenderness.  Though she does not have typical heartburn symptoms.  If no improvement in approximately 1 month we will plan for referral to GI.      Relevant Orders   Celiac Pnl 2 rflx Endomysial Ab Ttr   Other fatigue    Patient notes generalized fatigue, worse in the setting of her abdominal symptoms.  Discussed getting blood work to evaluate for possible causes.      Relevant Orders   Ferritin   Vitamin B12   Vitamin D, 25-hydroxy   Other Visit Diagnoses     Constipation, unspecified constipation type    -  Primary   Relevant Orders   Celiac Pnl 2 rflx Endomysial Ab Ttr   Comprehensive metabolic panel   TSH   Magnesium    Diarrhea, unspecified type       Relevant Orders   Celiac Pnl 2 rflx Endomysial Ab Ttr   TSH        Return if symptoms worsen or fail to improve.  Lynnda Child, MD

## 2021-10-11 NOTE — Assessment & Plan Note (Signed)
Persistent and recurrent symptoms, associated with both constipation and diarrhea.  We will get labs to evaluate for thyroid and celiac.  She also notes some early fullness question for poor emptying.  Suspect this may be IBS, handout for low FODMAP diet provided.  Patient is highly interested in natural approach, discussed we could try omeprazole or Reglan if symptoms or not improved given some epigastric tenderness.  Though she does not have typical heartburn symptoms.  If no improvement in approximately 1 month we will plan for referral to GI.

## 2021-10-12 ENCOUNTER — Other Ambulatory Visit: Payer: Self-pay | Admitting: Family Medicine

## 2021-10-12 DIAGNOSIS — E559 Vitamin D deficiency, unspecified: Secondary | ICD-10-CM

## 2021-10-12 MED ORDER — VITAMIN D (ERGOCALCIFEROL) 1.25 MG (50000 UNIT) PO CAPS: 50000.0000 [IU] | ORAL_CAPSULE | ORAL | 1 refills | Status: DC

## 2021-10-20 LAB — CELIAC PNL 2 RFLX ENDOMYSIAL AB TTR
(tTG) Ab, IgA: <1 U/mL
(tTG) Ab, IgG: <1 U/mL
Endomysial Ab IgA: NEGATIVE
Gliadin IgA: 6.3 U/mL
Gliadin IgG: <1 U/mL
Immunoglobulin A: 255 mg/dL (ref 47–310)

## 2021-11-03 ENCOUNTER — Encounter: Payer: Self-pay | Admitting: Family Medicine

## 2021-11-19 NOTE — Progress Notes (Signed)
Mild abnormalities to [place] but signiica to cat and dog. Wil ld/w patient at nex visit sept 2023., Will not call with results

## 2021-12-02 ENCOUNTER — Encounter: Payer: Self-pay | Admitting: Internal Medicine

## 2021-12-08 ENCOUNTER — Encounter: Payer: Self-pay | Admitting: Family Medicine

## 2021-12-08 DIAGNOSIS — R140 Abdominal distension (gaseous): Secondary | ICD-10-CM

## 2021-12-08 DIAGNOSIS — R197 Diarrhea, unspecified: Secondary | ICD-10-CM

## 2021-12-08 DIAGNOSIS — K5900 Constipation, unspecified: Secondary | ICD-10-CM

## 2021-12-12 IMAGING — DX DG PELVIS 1-2V
1 series · 1 of 1 positions shown · non-contrast
Comparison: None

CLINICAL DATA: Sports hernia with pain for 3 months

EXAM:
PELVIS - 1-2 VIEW

[pelvis ap]
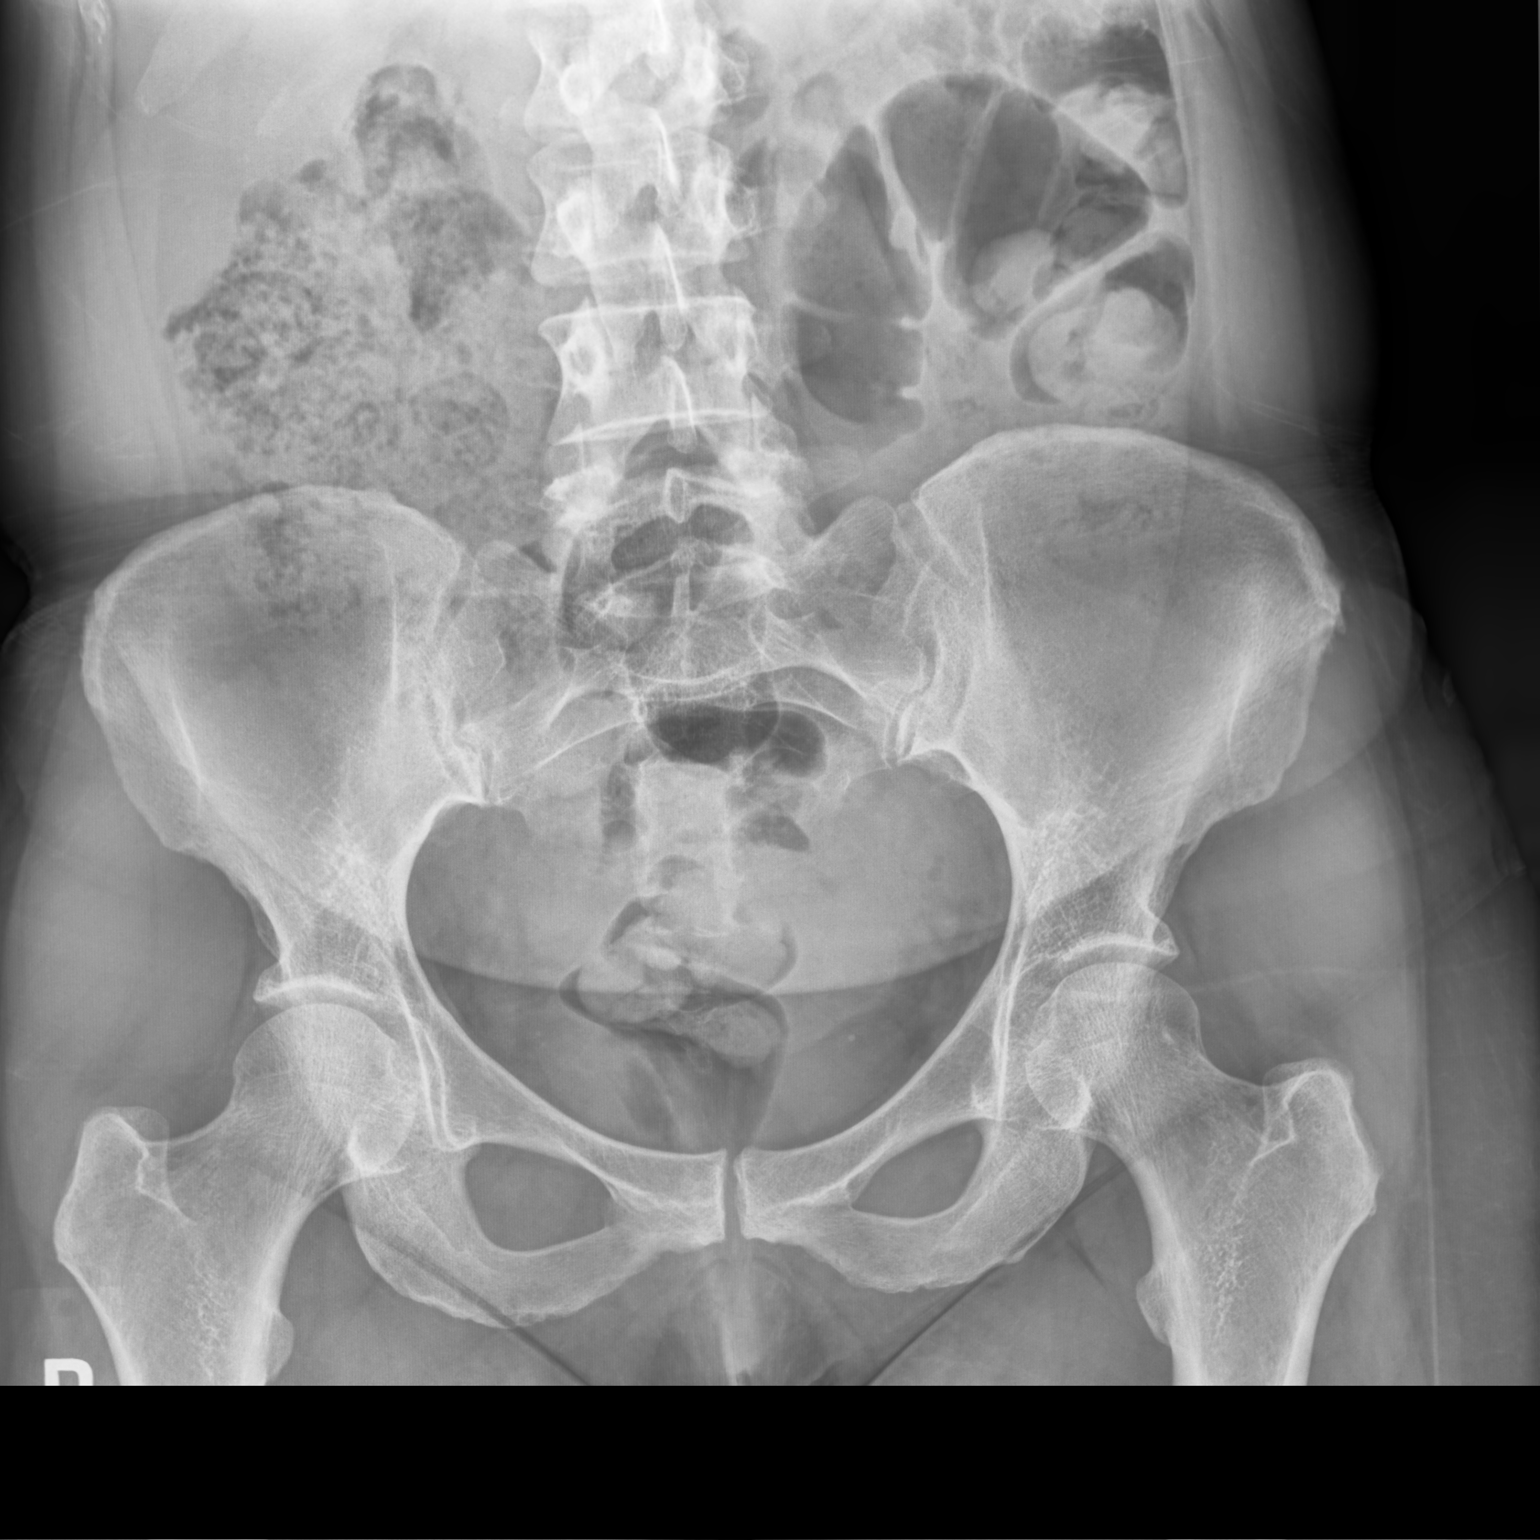

[1 of 1 positions shown; findings below may reference images not displayed]

FINDINGS: Normal bowel gas pattern.

Osseous mineralization normal.

Hip and SI joint spaces preserved.

No fracture, dislocation, or bone destruction.
IMPRESSION: Normal exam.

## 2021-12-12 NOTE — Telephone Encounter (Signed)
Please change referral to [facility] in [place]

## 2021-12-12 NOTE — Addendum Note (Signed)
Addended by: Kris Mouton on: 12/12/2021 12:27 PM   Modules accepted: Orders

## 2021-12-13 ENCOUNTER — Ambulatory Visit: Payer: Managed Care, Other (non HMO) | Admitting: Internal Medicine

## 2021-12-13 ENCOUNTER — Encounter: Payer: Self-pay | Admitting: Internal Medicine

## 2021-12-13 VITALS — BP 124/76 | HR 73 | Temp 98.1°F | Ht 62.0 in | Wt 171.0 lb

## 2021-12-13 DIAGNOSIS — J4540 Moderate persistent asthma, uncomplicated: Secondary | ICD-10-CM | POA: Diagnosis not present

## 2021-12-13 DIAGNOSIS — R768 Other specified abnormal immunological findings in serum: Secondary | ICD-10-CM

## 2021-12-13 DIAGNOSIS — D721 Eosinophilia, unspecified: Secondary | ICD-10-CM | POA: Diagnosis not present

## 2021-12-13 DIAGNOSIS — R140 Abdominal distension (gaseous): Secondary | ICD-10-CM | POA: Diagnosis not present

## 2021-12-13 LAB — NITRIC OXIDE: FeNO level (ppb): 55

## 2021-12-13 NOTE — Progress Notes (Signed)
OV 04/16/2015  Chief Complaint  Patient presents with   Follow-up    Former [facility] pt. Pt states her breathing is doing well. Pt states she only gets SOB if she is exposed to her "normal" triggers - smoke, cold, fragrences. Pt denies cough and CP/tightness.    41 year old mother of triplets were 30 years old she has a history of asthma and used to be followed by Dr. Lupita Leash. She is here for routine one-year follow-up. Background history of asthma since middle school. Last admission for asthma or emergency room visit was several years ago. Last prednisone use was a few years ago. She has cat sensitivity but not to the cat she has in her house but only to cats outside her house. She is on Symbicort for a few years. Compliance is around 50-60 percent. Because of triplet she forgets to take them at night. Overall she is doing well with her asthma. She is no longer running because of her busy life with triplets. Asthma control question score is 0 out of 6. In particular she does not wake up at night because of asthma. When she wakes up in the morning she has no symptoms. She is not limited in her activities because of asthma. She does not expands dyspnea because of asthma. And she has not had any wheeze. She has not used albuterol in the last 1 week. In total she is use albuterol 3 times in the last few months.  Exhaled nitric oxide is 21 ppb and is normal.   She has reluctantly agreed to have flu shot    OV 10/07/2015  Chief Complaint  Patient presents with   Follow-up    Pt reports she is unable to do any running w/o having to use her rescue inhaler.     Follow-up moderate persistent asthma on Symbicort  Last seen early 2017. This is a routine 6 month follow-up. She says that now she's been running several times a week 3 miles as part of her routine exercise/marathon preparation. She feels that after I reduced his Symbicort dosage based on 50% compliance and normal exhaled nitric oxide, she feels that  she's having increased symptomatology. In fact asthma control question shows score of 1.2 whereas it was 0 before. She feels that her runs of more difficult. This is despite the fact she warms up. She is also saying that she feels the radius Symbicort is responsible despite factoring in the current heat and humidity and taking albuterol before the runs. Otherwise feels fine no problems.   OV 08/01/2017  Chief Complaint  Patient presents with   Follow-up    Last seen 10/07/15. Pt states she has been doing good and denies any complaints or concerns.   Follow-up moderate persistent asthma on Symbicort  Last seen July 2017.  She then did not follow-up because she has been doing well.  She made this appointment after she started realizing she was running out of Symbicort and call for a refill in the office asked her to make a follow-up.  She tells me that she is 100% compliant with Symbicort 160/4 0.5 to 2 puffs twice daily.  Her albuterol use is rare.  She works out at [employer] and also does running.  She does high intensity interval training.  For all this she does warm up and cool down as advised her at the previous visit.  She also takes albuterol preexercise and during exercise if needed.  Her asthma control questionnaire is 0.6  on a five-point scale but all of the symptoms are only at exercise.  At baseline she does not wake up at night and when she wakes up she has no symptoms.  She feels very slightly limited because of her asthma and she is a little short of breath because of asthma and it is totally during sprints and workouts.  She does not wheeze and she uses her albuterol 1 or 2 times a week for high intensity workout only.  I have told her that she would need at least annual follow-up    OV 10/08/2019   Subjective:  Patient ID: Carola Rhine, female , DOB: [birth year], age 44 y.o. years. , MRN: [id number],  ADDRESS: [place] [address] PCP  Patient, No Pcp Per Providers :  Treatment Team:  Attending Provider: Brand Males, MD   Chief Complaint  Patient presents with   Follow-up    No complaints    Moderate persistent asthma on Symbicort   HPI BRITTINI HAYMER 41 y.o. -returns for follow-up.  She continues her Symbicort daily.  Not seen her in 2 years.  She says that she called for a refill and because it has been 2 years the office asked her to make a follow-up visit.  She uses her albuterol for rescue when the weather changes.  She then wakes up in the middle of the night with chest tightness and she needs albuterol.  This happens approximately every 6 weeks or so.  She is only used albuterol for rescue 1 time in the week during the daytime.  She does use albuterol as part of her preexercise regimen.  She feels overall that her asthma is extremely well controlled.  She is willing to go down to the lower dose of Symbicort because she is on the full dose of 160/4.52 puff 2 times daily.    41 year old female, never smoker followed for moderate persistent asthma.  She is a patient of Dr. Golden Pop and was last seen in office on 06/15/2020 by Royal Piedra, NP.  Past medical history significant for ventral hernia status post repair.  TEST/EVENTS:     06/10/2020: [name] with Parrett, NP. Previous 3 weeks with increased SOB, wheezing, and decreased activity tolerance. Increasing rescue inhaler needs. Prednisone burst. Increase ICS dose.   06/15/2020: OV with Parrett, NP. Follow up after asthma exacerbation. Maintained on Symbicort 160 Twice daily. PFTs scheduled. CXR no acute process. Pulmonary risk assessment for preop clearance (ventral hernia repair) - low moderate risk.  03/07/2021: Today - follow up after PFTs Patient presents today for follow up after PFTs which were relatively normal with a possible mild airflow obstruction and no significant BD. She was last seen in March with an asthma exacerbation and increased Symbicort to 160. She reports feeling better  since her last visit and that her breathing has been stable. She does feel that she experiences more shortness of breath with strenuous activity since undergoing her 3 surgeries. She thinks that this is likely related to deconditioning and less her asthma, but does not want to decrease her ICS dose at this time. She occasionally experiences wheezing, which she correlates more so to environmental triggers especially dust versus activity. She denies cough, orthopnea, PND, leg swelling or chest pain. She continues on Symbicort Twice daily and PRN Zyrtec. She uses her rescue inhaler once a week or less. Her ACT is 20 today. She will be cleared to go back to the gym and be off weight restriction in  January. She does continue to walk for exercise in the interim. Overall, she feels well and offers no further complaints.   FeNO 31 ppb   11/2013 PFTs: Normal pulmonary function testing. 03/07/2021 PFTs: FVC 2.81 (82), FEV1 2.16 (78), ratio 77 (93), TLC 4.59 (98), DLCO 27.27 (136). Very minimal airflow obstruction.     OV 06/06/2021  Subjective:  Patient ID: Carola Rhine, female , DOB: Nov 16, 1980 , age 61 y.o. , MRN: [id number] , ADDRESS: [place] [id number] PCP Lesleigh Noe, MD Patient Care Team: Lesleigh Noe, MD as PCP - General (Family Medicine) Brien Few, MD as Attending Physician (Obstetrics and Gynecology) Parrett, Fonnie Mu, NP as Nurse Practitioner (Pulmonary Disease)  This Provider for this visit: Treatment Team:  Attending Provider: Brand Males, MD    06/06/2021 -   Chief Complaint  Patient presents with   Follow-up    Pt states she becomes winded carrying things upstairs and winded at other times that she used to not get winded during. Also states that some things she used to be able to do while exercising, she is not able to do as well.   Moderate persistent asthma on Symbicort.  Not fully compliant with Singulair because of nighttime  schedule  HPI ARIEA ROCHIN 42 y.o. -returns for follow-up.  I personally not seen her since summer 2021.  She says she is doing "okay".  However she feels that after switching to ProAir based on insurance reasons instead of Ventolin her asthma is not as good as before.  However when I spoke to her I found out that she is not taking her Singulair as directed because she finds it inconvenient to do it at nighttime.  She says she sleeps well but in the daytime is using albuterol at least 2 or 3 times a week.  In addition she is having to do albuterol preexercise.  She does exercise a lot at [employer].  She says in 2022 she had 2 visits with nurse practitioners both acute low she did not get steroids.  1 in March 2022 and then again later in 2022.  She also had some surgeries in 2022 namely labrum repair, inguinal hernia repair and sports hernia repair.  She did have COVID in the summer 2022.  However there is no active wheezing or nocturnal symptoms.  ACT score has dropped to 17 today.   Asthma Control Test ACT Total Score  06/06/2021 17  03/07/2021 20  06/15/2020 23     Lab Results  Component Value Date   NITRICOXIDE 42 10/07/2015       OV 09/09/2021  Subjective:  Patient ID: Carola Rhine, female , DOB: Jul 24, 1980 , age 22 y.o. , MRN: [id number] , ADDRESS: [place] [id number] PCP Lesleigh Noe, MD Patient Care Team: Lesleigh Noe, MD as PCP - General (Family Medicine) Brien Few, MD as Attending Physician (Obstetrics and Gynecology) Parrett, Fonnie Mu, NP as Nurse Practitioner (Pulmonary Disease)  This Provider for this visit: Treatment Team:  Attending Provider: Brand Males, MD    09/09/2021 -   Chief Complaint  Patient presents with   Follow-up    PT states her SOB has been increased the last couple morning, some wheezing   Moderate persistent asthma on Symbicort and singular HPI MIANGEL FLOM 41 y.o. -last visit March 2023.  At  that visit we asked for increase Singulair compliance which she says she is compliant.  She is also taking as Symbicort on regular basis.  She tells me that with this she thought her asthma was better controlled.  However she feels the higher dose Symbicort is caused some weight gain.  She also tells me that she ran out of Singulair a few days ago and during this time when she stopped it some abdominal bloating that she had resolved.  Therefore she thinks the Singulair was causing this.  She is back on Singulair for the last few to several days.  This does not cause the return of the abdominal bloating but she is worried that this could return.  She continues on Zyrtec.  She also continues her burn [facility].  She is willing to get RAST allergy panel and blood IgE checked with also blood C. difficile for eosinophils.         OV 12/13/2021  Subjective:  Patient ID: Carola Rhine, female , DOB: 06-30-1980 , age 33 y.o. , MRN: [id number] , ADDRESS: [place] [id number] PCP Lesleigh Noe, MD Patient Care Team: Lesleigh Noe, MD as PCP - General (Family Medicine) Brien Few, MD as Attending Physician (Obstetrics and Gynecology) Parrett, Fonnie Mu, NP as Nurse Practitioner (Pulmonary Disease)  This Provider for this visit: Treatment Team:  Attending Provider: Brand Males, MD    12/13/2021 -   Chief Complaint  Patient presents with   Follow-up    Pt states that she has had some flare ups since last office visit.     HPI SAHNNON DEMSKE 41 y.o. -returns for follow-up.  In the interim she is a little more symptomatic.  She says randomly she gets more symptoms.  She does not feel like she needs to be on prednisone she is not taking any prednisone since last visit.  She did take some albuterol and she is better.  Her exhaled nitric oxide today is more elevated and is 55.  She not been on any prednisone no emergency room visits.  Last visit.  Blood work for  allergy profile and her IgE is elevated her blood eosinophils also elevated.  She seems to be quite allergic to cats.  She has 3 cats in the house and she is not able to part company with them.  She reports that she is also having GI issues.  She says when she has gluten her symptoms flareup.  She is try to get in with a gastroenterologist.  I have sent a secure chat to Dr. Collene Mares to see if she will be able to see her.  She did have ventral hernia repair in early 2022 and then an inguinal hernia repair later in 2022.  She is having difficulty getting to see a gastroenterologist.      Asthma Control Panel 04/16/15 10/07/2015  08/01/2017  05/2920 and 12/19/233 06/06/2021  09/09/2021  12/13/2021   Current Med Regimen   symbicort at 50% compliance symbicort 2puff once dail symbicort 160 at 2 puff bid  Symbicort but not taking Singulair because of nighttime inconvenience Symbicort and sigulair   ACT    20 -> 23 17 18 15   ACQ 5 point- 1 week. wtd avg score. <1.0 is good control 0.75-1.25 is grey zone. >1.25 poor control. Delta 0.5 is clinically meaningful 0 1.2 0.6 and only with exdrcise      FeNO ppB 21ppb 42 25 ppb   28 ppb 55  FeV1          Planned intervention  for visit  Increase symbi Continue symbi    Contnue symbicort, singulair and start dupixent   Asthma Control Test ACT Total Score  12/13/2021  3:32 PM 15  09/09/2021  9:45 AM 18  06/06/2021  2:19 PM 17     Latest Reference Range & Units 12/02/20 08:54 09/15/21 10:44  Eosinophils Absolute 0.0 - 0.5 K/uL 0.3 0.5    Latest Reference Range & Units 09/09/21 10:27  Interpretation  Pend  Sheep Sorrel IgE kU/L <0.10  Pecan/Hickory Tree IgE kU/L <0.10  IgE (Immunoglobulin E), Serum <OR=114 kU/L 230 (H)  Allergen, D pternoyssinus,d7 kU/L 0.27 (H)  Cat Dander kU/L 61.70 (H)  Dog Dander kU/L 2.56 (H)  [place] Grass kU/L <0.10  Johnson Grass kU/L <0.10  Timothy Grass kU/L 0.20 (H)  Cockroach kU/L <0.10  Aspergillus fumigatus, m3 kU/L <0.10   Allergen, Comm Silver Wendee Copp, t9 kU/L <0.10  Allergen, Cottonwood, t14 kU/L <0.10  Elm IgE kU/L <0.10  Allergen, Mulberry, t76 kU/L <0.10  Allergen, Oak,t7 kU/L <0.10  COMMON RAGWEED (SHORT) (W1) IGE kU/L <0.10  Allergen, Mouse Urine Protein, e78 kU/L <0.10  D. farinae kU/L 0.33 (H)  Allergen, Cedar tree, t12 kU/L <0.10  Box Elder IgE kU/L <0.10  Rough Pigweed  IgE kU/L <0.10  Allergen, A. alternata, m6 kU/L <0.10  Allergen, P. notatum, m1 kU/L <0.10  CLADOSPORIUM HERBARUM (M2) IGE kU/L <0.10  Class  0/1 0 0 0/1 0 0 0 0 5 2 0 0 0  0 0 0 0 0 0 0 0/1 0 0 0  (H): Data is abnormally high   PFT     Latest Ref Rng & Units 03/07/2021    1:56 PM 12/01/2013    3:58 PM  PFT Results  FVC-Pre L 2.67  3.13   FVC-Predicted Pre % 78  90   FVC-Post L 2.81  3.20   FVC-Predicted Post % 82  92   Pre FEV1/FVC % % 76  77   Post FEV1/FCV % % 77  79   FEV1-Pre L 2.04  2.40   FEV1-Predicted Pre % 73  83   FEV1-Post L 2.16  2.54   DLCO uncorrected ml/min/mmHg 27.27    DLCO UNC% % 136    DLCO corrected ml/min/mmHg 27.27    DLCO COR %Predicted % 136    DLVA Predicted % 142    TLC L 4.59    TLC % Predicted % 98    RV % Predicted % 128         has a past medical history of Asthma, Asthma, intermittent, Carpal tunnel syndrome on both sides, 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   reports that she has never smoked. She has never used smokeless tobacco.  Past Surgical History:  Procedure Laterality Date   CESAREAN SECTION  02/16/2012   Procedure: CESAREAN SECTION;  Surgeon: Lovenia Kim, MD;  Location: [facility];  Service: Obstetrics;  Laterality: N/A;   HERNIA REPAIR  06/17/2020    hernia repair with mesh and tummy tuck   HIP ARTHROSCOPY Right 08/03/2020   Procedure: Rt hip arthroscopic labrum repair;  Surgeon: Nicholes Stairs, MD;  Location: [facility];  Service: Orthopedics;  Laterality: Right;   WISDOM TOOTH EXTRACTION      Allergies  Allergen Reactions   Aspirin Shortness Of Breath  and Other (See Comments)    Can trigger asthma, told nurse ibuprofen makes her feel odd and only takes one 200mg  tablet if needed    Immunization History  Administered Date(s) Administered   Influenza,inj,Quad PF,6+ Mos 03/11/2013, 04/16/2015   PFIZER(Purple Top)SARS-COV-2 Vaccination 06/07/2019, 06/28/2019   Rho (D) Immune Globulin 02/18/2012   Td 06/17/2008   Varicella 12/07/2010    Family History  Problem Relation Age of Onset   Hypertension Father    Diabetes Father    Obesity Sister    Heart attack Maternal Grandfather    Stroke Paternal Grandfather      Current Outpatient Medications:    albuterol (PROAIR HFA) 108 (90 Base) MCG/ACT inhaler, Inhale 1-2 puffs into the lungs every 6 (six) hours as needed for wheezing or shortness of breath. 1-2 puffs 15 min prior to exercise., Disp: 8 g, Rfl: 6   cetirizine (ZYRTEC) 10 MG tablet, Take 10 mg by mouth as needed., Disp: , Rfl:    ELDERBERRY PO, Take 1 each by mouth as needed., Disp: , Rfl:    montelukast (SINGULAIR) 10 MG tablet, Take 1 tablet (10 mg total) by mouth at bedtime., Disp: 30 tablet, Rfl: 3   Multiple Vitamin (MULTIVITAMIN WITH MINERALS) TABS tablet, Take 1 tablet by mouth daily., Disp: , Rfl:    SYMBICORT 160-4.5 MCG/ACT inhaler, Inhale 2 puffs by mouth twice daily, Disp: 11 g, Rfl: 5      Objective:   Vitals:   12/13/21 1536  BP: 124/76  Pulse: 73  Temp: 98.1 F (36.7 C)  TempSrc: Oral  SpO2: 99%  Weight: 171 lb (77.6 kg)  Height: 5\' 2"  (1.575 m)    Estimated body mass index is 31.28 kg/m as calculated from the following:   Height as of this encounter: 5\' 2"  (1.575 m).   Weight as of this  encounter: 171 lb (77.6 kg).  @WEIGHTCHANGE @  [employer]   12/13/21 1536  Weight: 171 lb (77.6 kg)     Physical Exam General: No distress. Looks well Neuro: Alert and Oriented x 3. GCS 15. Speech normal Psych: Pleasant Resp:  Barrel Chest - no.  Wheeze - no, Crackles - no, No overt respiratory distress CVS: Normal heart sounds. Murmurs - no Ext: Stigmata of Connective Tissue Disease - no HEENT: Normal upper airway. PEERL +. No post nasal drip        Assessment:       ICD-10-CM   1. Asthma, moderate persistent, poorly-controlled  J45.40 Nitric oxide    2. 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 Elevated IgE level  R76.8     4. Bloating  R14.0          Plan:     Patient Instructions     ICD-10-CM   1. Asthma, moderate persistent, poorly-controlled  J45.40 Nitric oxide    2. 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 Elevated IgE level  R76.8     4. Cat allergies  J30.81         Symptoms suggest asthma control Is not optimal  - singulair helping but seems to have caused bloating and constipation in past  - symbicort high dose helping but seems to be causing weight gain  Blood wors shows eosinophilia, allergy to cats   Plan -Continue Symbicort at  80/4.5  2 puffs Twice daily - , brush tongue and rinse mouth afterwards.  - monitor control and weight gain -Continue albuterol inhaler 1-2 puffs every  6 hours as needed for shortness of breath or wheezing.  - Take 1-2 puffs 15 minutes before exercise -Continue Zyrtec 10 mg daily as needed for allergies/environmental triggers.  -Continue Singulair 10 mg daily  - monitor for gi symptoms - call if recurs - START DUPIXENT after counseling with [facility] our pharmacist - if symptoms get worse call for prednisone  Abdominal Bloating  Plan  - refer Dr Juanita Craver  Followup  - 3 months or sooner if needed  - ACT test and feno test at followup    SIGNATURE    Dr. Brand Males, M.D.,  F.C.C.P,  Pulmonary and Critical Care Medicine Staff Physician, [place] Director - Interstitial Lung Disease  Program  [facility] [place] [facility] [place], [place], [address]  Pager: [phone], If no answer or between  15:00h - 7:00h: call [phone] Telephone: [phone]  4:12 PM 12/13/2021

## 2021-12-13 NOTE — Patient Instructions (Addendum)
ICD-10-CM   1. Asthma, moderate persistent, poorly-controlled  J45.40 Nitric oxide    2. 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 Elevated IgE level  R76.8     4. Cat allergies  J30.81         Symptoms suggest asthma control Is not optimal  - singulair helping but seems to have caused bloating and constipation in past  - symbicort high dose helping but seems to be causing weight gain  Blood wors shows eosinophilia, allergy to cats   Plan -Continue Symbicort at  80/4.5  2 puffs Twice daily - , brush tongue and rinse mouth afterwards.  - monitor control and weight gain -Continue albuterol inhaler 1-2 puffs every 6 hours as needed for shortness of breath or wheezing.  - Take 1-2 puffs 15 minutes before exercise -Continue Zyrtec 10 mg daily as needed for allergies/environmental triggers.  -Continue Singulair 10 mg daily  - monitor for gi symptoms - call if recurs - START DUPIXENT after counseling with [facility] our pharmacist - if symptoms get worse call for prednisone  Abdominal Bloating  Plan  - refer Dr Juanita Craver  Followup  - 3 months or sooner if needed  - ACT test and feno test at followup

## 2021-12-14 ENCOUNTER — Encounter: Payer: Self-pay | Admitting: Internal Medicine

## 2021-12-14 DIAGNOSIS — J45909 Unspecified asthma, uncomplicated: Secondary | ICD-10-CM

## 2021-12-15 ENCOUNTER — Telehealth: Payer: Self-pay | Admitting: Internal Medicine

## 2021-12-15 MED ORDER — ALBUTEROL SULFATE HFA 108 (90 BASE) MCG/ACT IN AERS: 1.0000 | INHALATION_SPRAY | Freq: Four times a day (QID) | RESPIRATORY_TRACT | 6 refills | Status: AC | PRN

## 2021-12-15 MED ORDER — SYMBICORT 160-4.5 MCG/ACT IN AERO: 2.0000 | INHALATION_SPRAY | Freq: Two times a day (BID) | RESPIRATORY_TRACT | 5 refills | Status: AC

## 2021-12-15 NOTE — Telephone Encounter (Signed)
Anyone first available in [place] or [place]

## 2021-12-15 NOTE — Telephone Encounter (Signed)
I sent referral to Dr Collene Mares per your request.  I received a vm from [facility] in Dr [facility] office this morning stating Dr Collene Mares is no longer taking new [employer] patient's.  I know you spoke to Dr Collene Mares about this patient.  How do you want to proceed?

## 2021-12-16 NOTE — Telephone Encounter (Signed)
Please see Dr. Golden Pop response and follow up.  Thank you.

## 2021-12-19 ENCOUNTER — Telehealth: Payer: Managed Care, Other (non HMO) | Admitting: Pharmacist

## 2021-12-19 ENCOUNTER — Other Ambulatory Visit (HOSPITAL_COMMUNITY): Payer: Self-pay

## 2021-12-19 ENCOUNTER — Telehealth: Payer: Self-pay | Admitting: Pharmacist

## 2021-12-19 DIAGNOSIS — Z7189 Other specified counseling: Secondary | ICD-10-CM

## 2021-12-19 DIAGNOSIS — J4540 Moderate persistent asthma, uncomplicated: Secondary | ICD-10-CM

## 2021-12-19 NOTE — Telephone Encounter (Signed)
PA started for Dupixent 300MG /2ML pen-injectors loading dose.   PA has been submitted and is awaiting determination.   Key: [id number]

## 2021-12-19 NOTE — Telephone Encounter (Signed)
Patient is already scheduled with [facility] for 10/03. Will close encounter.

## 2021-12-19 NOTE — Telephone Encounter (Signed)
Referral that was placed specifically states to send to Dr Collene Mares.  Will need a new referral placed please to send to another provider.

## 2021-12-19 NOTE — Telephone Encounter (Signed)
Please start Purdin.  Dose: 600mg  SQ at Week 0 then 300mg  SQ every 14 days  Dx: Moderate persistent asthma (J45.40)  Knox Saliva, PharmD, MPH, BCPS, CPP Clinical Pharmacist (Rheumatology and Pulmonology)

## 2021-12-19 NOTE — Progress Notes (Unsigned)
HPI Patient called today by [facility] Pulmonary pharmacy team for Dupixent new start counseling.  Past medical history includes ***  Number of hospitalizations in past year: Number of ***COPD/asthma exacerbations in past year:   Respiratory Medications Current regimen: *** Tried in past: *** Patient reports {Adherence challenges yes no:3044014::"adherence challenges","no known adherence challenges"}  OBJECTIVE Allergies  Allergen Reactions   Aspirin Shortness Of Breath and Other (See Comments)    Can trigger asthma, told nurse ibuprofen makes her feel odd and only takes one 200mg  tablet if needed    Outpatient Encounter Medications as of 12/19/2021  Medication Sig   albuterol (PROAIR HFA) 108 (90 Base) MCG/ACT inhaler Inhale 1-2 puffs into the lungs every 6 (six) hours as needed for wheezing or shortness of breath. 1-2 puffs 15 min prior to exercise.   cetirizine (ZYRTEC) 10 MG tablet Take 10 mg by mouth as needed.   ELDERBERRY PO Take 1 each by mouth as needed.   montelukast (SINGULAIR) 10 MG tablet Take 1 tablet (10 mg total) by mouth at bedtime.   Multiple Vitamin (MULTIVITAMIN WITH MINERALS) TABS tablet Take 1 tablet by mouth daily.   SYMBICORT 160-4.5 MCG/ACT inhaler Inhale 2 puffs into the lungs 2 (two) times daily.   No facility-administered encounter medications on file as of 12/19/2021.     Immunization History  Administered Date(s) Administered   Influenza,inj,Quad PF,6+ Mos 03/11/2013, 04/16/2015   PFIZER(Purple Top)SARS-COV-2 Vaccination 06/07/2019, 06/28/2019   Rho (D) Immune Globulin 02/18/2012   Td 06/17/2008   Varicella 12/07/2010     PFTs    Latest Ref Rng & Units 03/07/2021    1:56 PM 12/01/2013    3:58 PM  PFT Results  FVC-Pre L 2.67  3.13   FVC-Predicted Pre % 78  90   FVC-Post L 2.81  3.20   FVC-Predicted Post % 82  92   Pre FEV1/FVC % % 76  77   Post FEV1/FCV % % 77  79   FEV1-Pre L 2.04  2.40   FEV1-Predicted Pre % 73  83   FEV1-Post L 2.16   2.54   DLCO uncorrected ml/min/mmHg 27.27    DLCO UNC% % 136    DLCO corrected ml/min/mmHg 27.27    DLCO COR %Predicted % 136    DLVA Predicted % 142    TLC L 4.59    TLC % Predicted % 98    RV % Predicted % 128       Eosinophils Most recent blood eosinophil count was 500 cells/microL taken on 09/15/21, 300 on 12/02/2020.   IgE: 230 on 09/09/21   Assessment   Biologics training for dupilumab (Dupixent)  Goals of therapy: Mechanism: human monoclonal IgG4 antibody that inhibits interleukin-4 and interleukin-13 cytokine-induced responses, including release of proinflammatory cytokines, chemokines, and IgE Reviewed that Dupixent is add-on medication and patient must continue maintenance inhaler regimen. Response to therapy: may take 4 months to determine efficacy. Discussed that patients generally feel improvement sooner than 4 months.  Side effects: injection site reaction (6-18%), antibody development (5-16%), ophthalmic conjunctivitis (2-16%), transient blood eosinophilia (1-2%)  Dose: 600mg  at Week 0 (administered today in clinic) followed by 300mg  every 14 days thereafter  Administration/Storage:  Reviewed administration sites of thigh or abdomen (at least 2-3 inches away from abdomen). Reviewed the upper arm is only appropriate if caregiver is administering injection  Do not shake pen/syringe as this could lead to product foaming or precipitation. Do not use if solution is discolored or contains particulate matter  or if window on prefilled pen is yellow (indicates pen has been used).  Reviewed storage of medication in refrigerator. Reviewed that [place] can be stored at room temperature in unopened carton for up to 14 days.  Access: Approval pending through insurance Once approved, will need to enroll patient into copay card.  Medication Reconciliation  A drug regimen assessment was performed, including review of allergies, interactions, disease-state management, dosing and  immunization history. Medications were reviewed with the patient, including name, instructions, indication, goals of therapy, potential side effects, importance of adherence, and safe use.  Drug interaction(s): none noted  Immunizations  Reviewed importance of staying UTD on vaccinations including flu, pneumonia, meningococcal  PLAN Pharmacy team to start [place]. Once approved, will schedule Dupixent new start visit with 30 minute monitoring period.  All questions encouraged and answered.  Instructed patient to reach out with any further questions or concerns.  Thank you for allowing pharmacy to participate in this patient's care.  This appointment required 20 minutes of patient care (this includes precharting, chart review, review of results, face-to-face care, etc.).  Knox Saliva, PharmD, MPH, BCPS, CPP Clinical Pharmacist (Rheumatology and Pulmonology)

## 2021-12-20 ENCOUNTER — Ambulatory Visit: Payer: Managed Care, Other (non HMO) | Admitting: Internal Medicine

## 2021-12-20 ENCOUNTER — Encounter: Payer: Self-pay | Admitting: Internal Medicine

## 2021-12-20 VITALS — BP 140/90 | HR 80 | Ht 62.5 in | Wt 170.0 lb

## 2021-12-20 DIAGNOSIS — R140 Abdominal distension (gaseous): Secondary | ICD-10-CM

## 2021-12-20 DIAGNOSIS — E669 Obesity, unspecified: Secondary | ICD-10-CM | POA: Diagnosis not present

## 2021-12-20 DIAGNOSIS — R1032 Left lower quadrant pain: Secondary | ICD-10-CM

## 2021-12-20 MED ORDER — RIFAXIMIN 550 MG PO TABS: 550.0000 mg | ORAL_TABLET | Freq: Three times a day (TID) | ORAL | 0 refills | Status: DC

## 2021-12-20 NOTE — Progress Notes (Signed)
HISTORY OF PRESENT ILLNESS:  Nicole Owens is a 41 y.o. 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  Patient dates her problems to prior surgeries.  Specifically, concurrent ventral hernia repair with abdominoplasty around March 2022.  Prior CT scan from January 14, 2020 revealed diastases of the midline ventral musculature with a broad-based ventral hernia containing a loop of small bowel and a segment of transverse colon without evidence of obstruction.  Patient reports varying degrees of bloating discomfort at variable times throughout the day.  May or may not be exacerbated by meals.  Results and significant variability in terms of the type of close that she can wear comfortably.  She is a vigorous exerciser.  She has had 20 pound weight gain over the past 3 years.  She has daily bowel movements.  She does report a sensation of decreased appetite.  However, no nausea or vomiting.  There is occasional fleeting left lower quadrant pain.  She was tested for celiac disease.  Serologies were negative.  She tried a FODMAP diet, but this did not help.  She is wondering about food allergies.  She is wondering about SIBO.  Review of other blood work from October 11, 2021 shows normal comprehensive metabolic panel and normal CBC with hemoglobin 13.5.  B12 level 215.  Vitamin D level low at 23.44.  Ferritin level 17.2.  Normal TSH.  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   Female infertility 04/30/2010   history restored after error with record merge   [place] 07/17/2008   Qualifier: Diagnosis of  By: Diona Browner MD, Amy     Heartburn  in pregnancy    nausea   Multiple gestation - triplets 08/10/2011   Postpartum care following cesarean delivery 02/17/2012   Rh negative, maternal 02/17/2012   S/P cesarean section - triplets 11/29 02/17/2012    Past Surgical History:  Procedure Laterality Date   CESAREAN SECTION  02/16/2012   Procedure: CESAREAN SECTION;  Surgeon: Lovenia Kim, MD;  Location: [facility];  Service: Obstetrics;  Laterality: N/A;   HERNIA REPAIR  06/17/2020   hernia repair with mesh and tummy tuck   HIP ARTHROSCOPY Right 08/03/2020   Procedure: Rt hip arthroscopic labrum repair;  Surgeon: Nicholes Stairs, MD;  Location: [facility];  Service: Orthopedics;  Laterality: Right;   WISDOM TOOTH EXTRACTION      Social History Nicole Owens  reports that she has never smoked. She has never used smokeless tobacco. She reports current alcohol use. She reports that she does not use drugs.  family history includes Diabetes in her father; Heart attack in her maternal grandfather; Hypertension in her father; Obesity in her sister; Stroke in her paternal grandfather.  Allergies  Allergen Reactions   Aspirin Shortness Of Breath and Other (See Comments)    Can trigger asthma, told nurse ibuprofen makes her feel odd and only takes one 200mg  tablet if needed       PHYSICAL EXAMINATION: Vital signs: BP (!) 140/90   Pulse 80   Ht 5' 2.5" (1.588 m)   Wt 170 lb (77.1 kg)  BMI 30.60 kg/m   Constitutional: generally well-appearing, no acute distress Psychiatric: alert and oriented x3, cooperative Eyes: extraocular movements intact, anicteric, conjunctiva pink Mouth: oral pharynx moist, no lesions Neck: supple no lymphadenopathy Cardiovascular: heart regular rate and rhythm, no murmur Lungs: clear to auscultation bilaterally Abdomen: soft, nontender, nondistended, no obvious ascites, no peritoneal signs, normal bowel sounds, no organomegaly surgical incisions well-healed Rectal: Omitted Extremities: no  clubbing, cyanosis, or lower extremity edema bilaterally Skin: no lesions on visible extremities Neuro: No focal deficits.  Cranial nerves intact  ASSESSMENT:  1.  Chronic abdominal bloating discomfort as described.  May be related to altered anatomy status post ventral hernia repair with mesh and concurrent abdominoplasty.  Timing supports this notion.  Consider other etiologies.  No alarm features.  Blood work and celiac testing unremarkable. 2.  BMI of 30.6.  20 pound weight gain over the past 3 years 3.  Asthma   PLAN:  1.  Empiric trial of Xifaxan 550 mg p.o. 3 times daily for 2 weeks for possible SIBO 2.  Abdominal ultrasound to evaluate chronic abdominal bloating discomfort. 3.  Weight loss.  10 to 20 pound weight loss may nonspecifically improve her degree of discomfort.  We discussed this 4.  GI office follow-up in 2 months. A total time of 60 minutes was spent preparing to see the patient, reviewing outside laboratories, office notes, operative information.  Also, attaining comprehensive history, performing medically appropriate physical examination, counseling and educating the patient regarding the above listed issues, ordering medication, ordering advanced radiology, arranging follow-up, and documenting clinical information in the health record.

## 2021-12-20 NOTE — Patient Instructions (Addendum)
_______________________________________________________  If you are age 42 or older, your body mass index should be between 23-30. Your Body mass index is 30.6 kg/m. If this is out of the aforementioned range listed, please consider follow up with your Primary Care Provider.  If you are age 65 or younger, your body mass index should be between 19-25. Your Body mass index is 30.6 kg/m. If this is out of the aformentioned range listed, please consider follow up with your Primary Care Provider.   ________________________________________________________  The [facility] providers would like to encourage you to use [facility] to communicate with providers for non-urgent requests or questions.  Due to long hold times on the telephone, sending your provider a message by [facility] may be a faster and more efficient way to get a response.  Please allow 48 business hours for a response.  Please remember that this is for non-urgent requests.  _______________________________________________________  We have sent the following medications to your pharmacy for you to pick up at your convenience:  Roena Malady will be contacted by [facility] in the next 2 days to arrange an abdominal ultrasound  The number on your caller ID will be [phone], please answer when they call.  If you have not heard from them in 2 days please call [phone] to schedule.    Please follow up on ____________________

## 2021-12-26 ENCOUNTER — Other Ambulatory Visit (HOSPITAL_COMMUNITY): Payer: Self-pay

## 2021-12-26 NOTE — Telephone Encounter (Signed)
No response from previous PA. New case created and submitted.  Received notification from [facility] regarding a prior authorization for Nicole Owens.   Authorization has been APPROVED from 12/26/2021 to 12/26/2022.   KeyRhetta Mura PA Case ID: [id number]

## 2021-12-31 ENCOUNTER — Other Ambulatory Visit: Payer: Self-pay | Admitting: Nurse Practitioner

## 2021-12-31 DIAGNOSIS — J45909 Unspecified asthma, uncomplicated: Secondary | ICD-10-CM

## 2022-01-06 ENCOUNTER — Encounter: Payer: Self-pay | Admitting: Internal Medicine

## 2022-01-06 ENCOUNTER — Ambulatory Visit
Admission: RE | Admit: 2022-01-06 | Discharge: 2022-01-06 | Disposition: A | Discharge status: Home / Self Care | Payer: Managed Care, Other (non HMO) | Source: Ambulatory Visit | Attending: Internal Medicine | Admitting: Internal Medicine

## 2022-01-06 DIAGNOSIS — R140 Abdominal distension (gaseous): Secondary | ICD-10-CM

## 2022-01-12 ENCOUNTER — Other Ambulatory Visit (HOSPITAL_COMMUNITY): Payer: Self-pay

## 2022-01-12 NOTE — Telephone Encounter (Signed)
Appears test claim was not processed. Unable to run test claim. Patient will have to likely fill with [facility].  Activated Dupixent copay card today: ID: [id number] Group: [id number] BIN: [id number] PCN: [id number]  Spoke with patient. Scheduled for Dupixent new start on 01/19/22  Knox Saliva, PharmD, MPH, BCPS, CPP Clinical Pharmacist (Rheumatology and Pulmonology)

## 2022-01-19 ENCOUNTER — Ambulatory Visit: Payer: Managed Care, Other (non HMO) | Admitting: Pharmacist

## 2022-01-19 ENCOUNTER — Other Ambulatory Visit: Payer: Managed Care, Other (non HMO) | Admitting: Pharmacist

## 2022-01-19 DIAGNOSIS — Z7189 Other specified counseling: Secondary | ICD-10-CM

## 2022-01-19 DIAGNOSIS — J4540 Moderate persistent asthma, uncomplicated: Secondary | ICD-10-CM

## 2022-01-19 MED ORDER — DUPIXENT 300 MG/2ML ~~LOC~~ SOAJ: 300.0000 mg | SUBCUTANEOUS | 1 refills | Status: DC

## 2022-01-19 NOTE — Patient Instructions (Signed)
Your next [place] dose is due on 02/02/22, 02/16/22, and every 14 days thereafter  CONTINUE Symbicort (2 puffs twice daily) and montelukast 10mg  nightly . You will have to stay on a maintenance inhaler to remain on Dupixent. This is required by insurance and how Dupixent is FDA-approved.  Your prescription will be shipped from [facility]. Their phone number is [phone]. Please call to schedule shipment and confirm address. They will mail your medication to your home.  Your copay should be affordable. If you call the pharmacy and it is not affordable, please double-check that they are billing through your copay card as secondary coverage. That copay card information is: ID: [id number] Group: [id number] BIN: [id number] PCN: [id number]  You will need to be seen by your provider in 3 to 4 months to assess how [place] is working for you. Please ensure you have a follow-up appointment scheduled in [place] or February 2024. Call our clinic if you need to make this appointment.  How to manage an injection site reaction: Remember the 5 C's: COUNTER - leave on the counter at least 30 minutes but up to overnight to bring medication to room temperature. This may help prevent stinging COLD - place something cold (like an ice gel pack or cold water bottle) on the injection site just before cleansing with alcohol. This may help reduce pain CLARITIN - use Claritin (generic name is loratadine) for the first two weeks of treatment or the day of, the day before, and the day after injecting. This will help to minimize injection site reactions CORTISONE CREAM - apply if injection site is irritated and itching CALL ME - if injection site reaction is bigger than the size of your fist, looks infected, blisters, or if you develop hives

## 2022-01-19 NOTE — Progress Notes (Signed)
HPI Patient presents today to [facility] to see pharmacy team for [place] new start.  Past medical history includes moderate persistent asthma.   Patient was counseled on 12/19/21 over phone visit on [place]. She last saw Dr. Chase Caller on 12/13/21 and discussed increased frequency of asthma symptoms. She reported weight gain with high-dose Symbicort as well as bloating and constipation in past  Respiratory Medications Current regimen: Symbicort 132mcg-4.5mcg (1 puff twice daily), montelukast 10mg  daily, cetirizine 10mg  daily Patient reports no known adherence challenges  OBJECTIVE Allergies  Allergen Reactions   Aspirin Shortness Of Breath and Other (See Comments)    Can trigger asthma, told nurse ibuprofen makes her feel odd and only takes one 200mg  tablet if needed    Outpatient Encounter Medications as of 01/19/2022  Medication Sig   albuterol (PROAIR HFA) 108 (90 Base) MCG/ACT inhaler Inhale 1-2 puffs into the lungs every 6 (six) hours as needed for wheezing or shortness of breath. 1-2 puffs 15 min prior to exercise.   cetirizine (ZYRTEC) 10 MG tablet Take 10 mg by mouth as needed.   Cyanocobalamin (VITAMIN B 12 PO) Take 1 tablet by mouth daily.   ELDERBERRY PO Take 1 each by mouth as needed.   montelukast (SINGULAIR) 10 MG tablet TAKE 1 TABLET BY MOUTH AT BEDTIME   Multiple Vitamin (MULTIVITAMIN WITH MINERALS) TABS tablet Take 1 tablet by mouth daily.   rifaximin (XIFAXAN) 550 MG TABS tablet Take 1 tablet (550 mg total) by mouth 3 (three) times daily.   SYMBICORT 160-4.5 MCG/ACT inhaler Inhale 2 puffs into the lungs 2 (two) times daily.   VITAMIN D PO Take 1 tablet by mouth daily.   No facility-administered encounter medications on file as of 01/19/2022.     Immunization History  Administered Date(s) Administered   Influenza,inj,Quad PF,6+ Mos 03/11/2013, 04/16/2015   PFIZER(Purple Top)SARS-COV-2 Vaccination 06/07/2019, 06/28/2019   Rho (D) Immune Globulin 02/18/2012    Td 06/17/2008   Varicella 12/07/2010     PFTs    Latest Ref Rng & Units 03/07/2021    1:56 PM 12/01/2013    3:58 PM  PFT Results  FVC-Pre L 2.67  3.13   FVC-Predicted Pre % 78  90   FVC-Post L 2.81  3.20   FVC-Predicted Post % 82  92   Pre FEV1/FVC % % 76  77   Post FEV1/FCV % % 77  79   FEV1-Pre L 2.04  2.40   FEV1-Predicted Pre % 73  83   FEV1-Post L 2.16  2.54   DLCO uncorrected ml/min/mmHg 27.27    DLCO UNC% % 136    DLCO corrected ml/min/mmHg 27.27    DLCO COR %Predicted % 136    DLVA Predicted % 142    TLC L 4.59    TLC % Predicted % 98    RV % Predicted % 128       Eosinophils Most recent blood eosinophil count was 500 cells/microL taken on 09/15/2021, 300 on 12/02/2020.   IgE: 230 on 09/09/2021   Assessment   Biologics training for dupilumab (Dupixent)  All of this reviewed in detail over the phone previously. Today we focused on storage and administration techniques  Goals of therapy: Mechanism: human monoclonal IgG4 antibody that inhibits interleukin-4 and interleukin-13 cytokine-induced responses, including release of proinflammatory cytokines, chemokines, and IgE Reviewed that Dupixent is add-on medication and patient must continue maintenance inhaler regimen. Response to therapy: may take 4 months to determine efficacy. Discussed that patients generally feel improvement  sooner than 4 months.  Side effects: injection site reaction (6-18%), antibody development (5-16%), ophthalmic conjunctivitis (2-16%), transient blood eosinophilia (1-2%)  Dose: 600mg  at Week 0 (administered today in clinic) followed by 300mg  every 14 days thereafter  Administration/Storage:  Reviewed administration sites of thigh or abdomen (at least 2-3 inches away from abdomen). Reviewed the upper arm is only appropriate if caregiver is administering injection  Do not shake pen/syringe as this could lead to product foaming or precipitation. Do not use if solution is discolored or  contains particulate matter or if window on prefilled pen is yellow (indicates pen has been used).  Reviewed storage of medication in refrigerator. Reviewed that [address] can be stored at room temperature in unopened carton for up to 14 days.  Access: Approval of Dupixent through: insurance Patient enrolled into copay card program to help with copay assistance.  Patient self-administered Dupixent 300mg /87ml x 2 (total dose 600mg ) in right lower abdomen and left lower abdomen using sample Dupixent 300mg /9mL autoinjector pen NDC: [phone] Lot: [id number] Expiration: 09/17/2023  Patient monitored for 30 minutes for adverse reaction.  Patient tolerated without issue. Injection site checked. Slight redness noted at right abdomen injection site however likely from patient lifting medication during injection. Patient denies itchiness or irritation. Provider notes no swelling otherwise at either injection site. Reviewed injection site reaction management verbally and written in AVS for future reference  Medication Reconciliation  A drug regimen assessment was performed, including review of allergies, interactions, disease-state management, dosing and immunization history. Medications were reviewed with the patient, including name, instructions, indication, goals of therapy, potential side effects, importance of adherence, and safe use.  Drug interaction(s): none noted  PLAN Continue Dupixent 300mg  every 14 days.  Next dose is due 02/02/2022 and every 14 days thereafter. Rx sent to: [facility]: [phone].  Patient provided with pharmacy phone number and advised to call later this week to schedule shipment to home. Patient provided with copay card information to provide to pharmacy if quoted copay exceeds $5 per month. Continue maintenance asthma regimen of: Symbicort 172mcg-4.5mcg (1 puff twice daily), montelukast 10mg  daily, cetirizine 10mg  daily   All questions encouraged and  answered.  Instructed patient to reach out with any further questions or concerns.  Thank you for allowing pharmacy to participate in this patient's care.  This appointment required 60 minutes of patient care (this includes precharting, chart review, review of results, face-to-face care, etc.).   Knox Saliva, PharmD, MPH, BCPS, CPP Clinical Pharmacist (Rheumatology and Pulmonology)

## 2022-02-01 NOTE — Telephone Encounter (Signed)
Patient sent MyChart stating pharmacy needs additional information. Reached out to [facility]. Per rep, Dupixent is shipping to patient's home today.  Chesley Mires, PharmD, MPH, BCPS, CPP Clinical Pharmacist (Rheumatology and Pulmonology)

## 2022-02-21 ENCOUNTER — Ambulatory Visit: Payer: Managed Care, Other (non HMO) | Admitting: Internal Medicine

## 2022-02-21 ENCOUNTER — Encounter: Payer: Self-pay | Admitting: Internal Medicine

## 2022-02-21 ENCOUNTER — Telehealth: Payer: Self-pay | Admitting: Internal Medicine

## 2022-02-21 VITALS — BP 116/80 | HR 74 | Ht 62.0 in | Wt 171.0 lb

## 2022-02-21 VITALS — BP 118/68 | HR 71 | Temp 97.9°F | Ht 62.5 in | Wt 172.0 lb

## 2022-02-21 DIAGNOSIS — R768 Other specified abnormal immunological findings in serum: Secondary | ICD-10-CM

## 2022-02-21 DIAGNOSIS — R110 Nausea: Secondary | ICD-10-CM

## 2022-02-21 DIAGNOSIS — R194 Change in bowel habit: Secondary | ICD-10-CM

## 2022-02-21 DIAGNOSIS — J4540 Moderate persistent asthma, uncomplicated: Secondary | ICD-10-CM | POA: Diagnosis not present

## 2022-02-21 DIAGNOSIS — D721 Eosinophilia, unspecified: Secondary | ICD-10-CM

## 2022-02-21 DIAGNOSIS — R109 Unspecified abdominal pain: Secondary | ICD-10-CM

## 2022-02-21 DIAGNOSIS — R140 Abdominal distension (gaseous): Secondary | ICD-10-CM

## 2022-02-21 DIAGNOSIS — Z23 Encounter for immunization: Secondary | ICD-10-CM | POA: Diagnosis not present

## 2022-02-21 LAB — NITRIC OXIDE: Nitric Oxide: 15

## 2022-02-21 MED ORDER — NA SULFATE-K SULFATE-MG SULF 17.5-3.13-1.6 GM/177ML PO SOLN: 1.0000 | Freq: Once | ORAL | 0 refills | Status: AC

## 2022-02-21 NOTE — Telephone Encounter (Signed)
[facility]/Team  She already had abdominal bloating at last visit and they referred her to GI.  Now after starting Dupixent.  She is only had 2 doses but after that she is noticing that she is having daily morning nausea and wondered if this is a side effect from Dupixent?  Please let me/her know based on literature review   Thanks    SIGNATURE    Dr. Kalman Shan, M.D., F.C.C.P,  Pulmonary and Critical Care Medicine Staff Physician, [facility] Center Director - Interstitial Lung Disease  Program  Medical Director - [facility] Pulmonary Fibrosis [facility] at [facility] [place], [place], [address]   Pager: [phone], If no answer  -> Check AMION or Try [phone] Telephone (clinical office): [phone] Telephone (research): [phone]  9:14 AM 02/21/2022

## 2022-02-21 NOTE — Progress Notes (Signed)
OV 04/16/2015  Chief Complaint  Patient presents with   Follow-up    Former [place] pt. Pt states her breathing is doing well. Pt states she only gets SOB if she is exposed to her "normal" triggers - smoke, cold, fragrences. Pt denies cough and CP/tightness.    41 year old mother of triplets were 47 years old she has a history of asthma and used to be followed by Dr. Lupita Owens. She is here for routine one-year follow-up. Background history of asthma since middle school. Last admission for asthma or emergency room visit was several years ago. Last prednisone use was a few years ago. She has cat sensitivity but not to the cat she has in her house but only to cats outside her house. She is on Symbicort for a few years. Compliance is around 50-60 percent. Because of triplet she forgets to take them at night. Overall she is doing well with her asthma. She is no longer running because of her busy life with triplets. Asthma control question score is 0 out of 6. In particular she does not wake up at night because of asthma. When she wakes up in the morning she has no symptoms. She is not limited in her activities because of asthma. She does not expands dyspnea because of asthma. And she has not had any wheeze. She has not used albuterol in the last 1 week. In total she is use albuterol 3 times in the last few months.  Exhaled nitric oxide is 21 ppb and is normal.   She has reluctantly agreed to have flu shot    OV 10/07/2015  Chief Complaint  Patient presents with   Follow-up    Pt reports she is unable to do any running w/o having to use her rescue inhaler.     Follow-up moderate persistent asthma on Symbicort  Last seen early 2017. This is a routine 6 month follow-up. She says that now she's been running several times a week 3 miles as part of her routine exercise/marathon preparation. She feels that after I reduced his Symbicort dosage based on 50% compliance and normal exhaled nitric oxide, she feels  that she's having increased symptomatology. In fact asthma control question shows score of 1.2 whereas it was 0 before. She feels that her runs of more difficult. This is despite the fact she warms up. She is also saying that she feels the radius Symbicort is responsible despite factoring in the current heat and humidity and taking albuterol before the runs. Otherwise feels fine no problems.   OV 08/01/2017  Chief Complaint  Patient presents with   Follow-up    Last seen 10/07/15. Pt states she has been doing good and denies any complaints or concerns.   Follow-up moderate persistent asthma on Symbicort  Last seen July 2017.  She then did not follow-up because she has been doing well.  She made this appointment after she started realizing she was running out of Symbicort and call for a refill in the office asked her to make a follow-up.  She tells me that she is 100% compliant with Symbicort 160/4 0.5 to 2 puffs twice daily.  Her albuterol use is rare.  She works out at [employer] and also does running.  She does high intensity interval training.  For all this she does warm up and cool down as advised her at the previous visit.  She also takes albuterol preexercise and during exercise if needed.  Her asthma control  questionnaire is 0.6 on a five-point scale but all of the symptoms are only at exercise.  At baseline she does not wake up at night and when she wakes up she has no symptoms.  She feels very slightly limited because of her asthma and she is a little short of breath because of asthma and it is totally during sprints and workouts.  She does not wheeze and she uses her albuterol 1 or 2 times a week for high intensity workout only.  I have told her that she would need at least annual follow-up    OV 10/08/2019   Subjective:  Patient ID: Nicole Owens, female , DOB: 1980-05-22, age 34 y.o. years. , MRN: [id number],  ADDRESS: [place] [address] PCP  Patient, No Pcp  Per Providers : Treatment Team:  Attending Provider: Brand Males, MD   Chief Complaint  Patient presents with   Follow-up    No complaints    Moderate persistent asthma on Symbicort   HPI Nicole Owens 41 y.o. -returns for follow-up.  She continues her Symbicort daily.  Not seen her in 2 years.  She says that she called for a refill and because it has been 2 years the office asked her to make a follow-up visit.  She uses her albuterol for rescue when the weather changes.  She then wakes up in the middle of the night with chest tightness and she needs albuterol.  This happens approximately every 6 weeks or so.  She is only used albuterol for rescue 1 time in the week during the daytime.  She does use albuterol as part of her preexercise regimen.  She feels overall that her asthma is extremely well controlled.  She is willing to go down to the lower dose of Symbicort because she is on the full dose of 160/4.52 puff 2 times daily.    41 year old female, never smoker followed for moderate persistent asthma.  She is a patient of Dr. Golden Owens and was last seen in office on 06/15/2020 by Nicole Piedra, NP.  Past medical history significant for ventral hernia status post repair.  TEST/EVENTS:     06/10/2020: Nicole Owens with Parrett, NP. Previous 3 weeks with increased SOB, wheezing, and decreased activity tolerance. Increasing rescue inhaler needs. Prednisone burst. Increase ICS dose.   06/15/2020: OV with Parrett, NP. Follow up after asthma exacerbation. Maintained on Symbicort 160 Twice daily. PFTs scheduled. CXR no acute process. Pulmonary risk assessment for preop clearance (ventral hernia repair) - low moderate risk.  03/07/2021: Today - follow up after PFTs Patient presents today for follow up after PFTs which were relatively normal with a possible mild airflow obstruction and no significant BD. She was last seen in March with an asthma exacerbation and increased Symbicort to 160. She reports  feeling better since her last visit and that her breathing has been stable. She does feel that she experiences more shortness of breath with strenuous activity since undergoing her 3 surgeries. She thinks that this is likely related to deconditioning and less her asthma, but does not want to decrease her ICS dose at this time. She occasionally experiences wheezing, which she correlates more so to environmental triggers especially dust versus activity. She denies cough, orthopnea, PND, leg swelling or chest pain. She continues on Symbicort Twice daily and PRN Zyrtec. She uses her rescue inhaler once a week or less. Her ACT is 20 today. She will be cleared to go back to the gym and be off  weight restriction in January. She does continue to walk for exercise in the interim. Overall, she feels well and offers no further complaints.   FeNO 31 ppb   11/2013 PFTs: Normal pulmonary function testing. 03/07/2021 PFTs: FVC 2.81 (82), FEV1 2.16 (78), ratio 77 (93), TLC 4.59 (98), DLCO 27.27 (136). Very minimal airflow obstruction.     OV 06/06/2021  Subjective:  Patient ID: Nicole Owens, female , DOB: [birth year] , age 89 y.o. , MRN: [id number] , ADDRESS: [address] [place] [address] PCP Lynnda Child, MD Patient Care Team: Lynnda Child, MD as PCP - General (Family Medicine) Olivia Mackie, MD as Attending Physician (Obstetrics and Gynecology) Nicole Owens, Virgel Bouquet, NP as Nurse Practitioner (Pulmonary Disease)  This Provider for this visit: Treatment Team:  Attending Provider: Kalman Shan, MD    06/06/2021 -   Chief Complaint  Patient presents with   Follow-up    Pt states she becomes winded carrying things upstairs and winded at other times that she used to not get winded during. Also states that some things she used to be able to do while exercising, she is not able to do as well.   Moderate persistent asthma on Symbicort.  Not fully compliant with Singulair because of nighttime  schedule  HPI KAJSA BUTRUM 41 y.o. -returns for follow-up.  I personally not seen her since summer 2021.  She says she is doing "okay".  However she feels that after switching to ProAir based on insurance reasons instead of Ventolin her asthma is not as good as before.  However when I spoke to her I found out that she is not taking her Singulair as directed because she finds it inconvenient to do it at nighttime.  She says she sleeps well but in the daytime is using albuterol at least 2 or 3 times a week.  In addition she is having to do albuterol preexercise.  She does exercise a lot at burn [facility].  She says in 2022 she had 2 visits with nurse practitioners both acute low she did not get steroids.  1 in March 2022 and then again later in 2022.  She also had some surgeries in 2022 namely labrum repair, inguinal hernia repair and sports hernia repair.  She did have COVID in the summer 2022.  However there is no active wheezing or nocturnal symptoms.  ACT score has dropped to 17 today.      OV 09/09/2021  Subjective:  Patient ID: Nicole Owens, female , DOB: 03/10/1981 , age 66 y.o. , MRN: [id number] , ADDRESS: [address] [place] [address] PCP Lynnda Child, MD Patient Care Team: Lynnda Child, MD as PCP - General (Family Medicine) Olivia Mackie, MD as Attending Physician (Obstetrics and Gynecology) Nicole Owens, Virgel Bouquet, NP as Nurse Practitioner (Pulmonary Disease)  This Provider for this visit: Treatment Team:  Attending Provider: Kalman Shan, MD    09/09/2021 -   Chief Complaint  Patient presents with   Follow-up    PT states her SOB has been increased the last couple morning, some wheezing   Moderate persistent asthma on Symbicort and singular HPI Nicole Owens 41 y.o. -last visit March 2023.  At that visit we asked for increase Singulair compliance which she says she is compliant.  She is also taking as Symbicort on regular basis.  She tells me that with  this she thought her asthma was better controlled.  However she feels the higher  dose Symbicort is caused some weight gain.  She also tells me that she ran out of Singulair a few days ago and during this time when she stopped it some abdominal bloating that she had resolved.  Therefore she thinks the Singulair was causing this.  She is back on Singulair for the last few to several days.  This does not cause the return of the abdominal bloating but she is worried that this could return.  She continues on Zyrtec.  She also continues her burn [facility].  She is willing to get RAST allergy panel and blood IgE checked with also blood C. difficile for eosinophils.         OV 12/13/2021  Subjective:  Patient ID: Nicole Owens, female , DOB: 08/16/1980 , age 37 y.o. , MRN: [id number] , ADDRESS: [address] [place] [address] PCP Lynnda Child, MD Patient Care Team: Lynnda Child, MD as PCP - General (Family Medicine) Olivia Mackie, MD as Attending Physician (Obstetrics and Gynecology) Nicole Owens, Virgel Bouquet, NP as Nurse Practitioner (Pulmonary Disease)  This Provider for this visit: Treatment Team:  Attending Provider: Kalman Shan, MD    12/13/2021 -   Chief Complaint  Patient presents with   Follow-up    Pt states that she has had some flare ups since last office visit.     HPI Nicole Owens 41 y.o. -returns for follow-up.  In the interim she is a little more symptomatic.  She says randomly she gets more symptoms.  She does not feel like she needs to be on prednisone she is not taking any prednisone since last visit.  She did take some albuterol and she is better.  Her exhaled nitric oxide today is more elevated and is 55.  She not been on any prednisone no emergency room visits.  Last visit.  Blood work for allergy profile and her IgE is elevated her blood eosinophils also elevated.  She seems to be quite allergic to cats.  She has 3 cats in the house and she is not able  to part company with them.  She reports that she is also having GI issues.  She says when she has gluten her symptoms flareup.  She is try to get in with a gastroenterologist.  I have sent a secure chat to Dr. Loreta Ave to see if she will be able to see her.  She did have ventral hernia repair in early 2022 and then an inguinal hernia repair later in 2022.  She is having difficulty getting to see a gastroenterologist.      Latest Reference Range & Units 12/02/20 08:54 09/15/21 10:44  Eosinophils Absolute 0.0 - 0.5 K/uL 0.3 0.5    Latest Reference Range & Units 09/09/21 10:27  Interpretation  Pend  Sheep Sorrel IgE kU/L <0.10  Pecan/Hickory Tree IgE kU/L <0.10  IgE (Immunoglobulin E), Serum <OR=114 kU/L 230 (H)  Allergen, D pternoyssinus,d7 kU/L 0.27 (H)  Cat Dander kU/L 61.70 (H)  Dog Dander kU/L 2.56 (H)  [place] Grass kU/L <0.10  Johnson Grass kU/L <0.10  Timothy Grass kU/L 0.20 (H)  Cockroach kU/L <0.10  Aspergillus fumigatus, m3 kU/L <0.10  Allergen, Comm Silver Charletta Cousin, t9 kU/L <0.10  Allergen, Cottonwood, t14 kU/L <0.10  Elm IgE kU/L <0.10  Allergen, Mulberry, t76 kU/L <0.10  Allergen, Oak,t7 kU/L <0.10  COMMON RAGWEED (SHORT) (W1) IGE kU/L <0.10  Allergen, Mouse Urine Protein, e78 kU/L <0.10  D. farinae kU/L 0.33 (H)  Allergen, Cyd Silence  tree, t12 kU/L <0.10  Box Elder IgE kU/L <0.10  Rough Pigweed  IgE kU/L <0.10  Allergen, A. alternata, m6 kU/L <0.10  Allergen, P. notatum, m1 kU/L <0.10  CLADOSPORIUM HERBARUM (M2) IGE kU/L <0.10  Class  0/1 0 0 0/1 0 0 0 0 5 2 0 0 0  0 0 0 0 0 0 0 0/1 0 0 0  (H): Data is abnormally high  OV 02/21/2022  Subjective:  Patient ID: Nicole Owens, female , DOB: [birth year] , age 41 y.o. , MRN: [id number] , ADDRESS: [address] [place] [id number] PCP Gweneth Dimitriody, Jessica, MD Patient Care Team: Gweneth Dimitriody, Jessica, MD as PCP - General (Family Medicine) Olivia Mackieaavon, Richard, MD as Attending Physician (Obstetrics and  Gynecology) Nicole Owens, Virgel Bouquetammy S, NP as Nurse Practitioner (Pulmonary Disease)  This Provider for this visit: Treatment Team:  Attending Provider: Kalman Shanamaswamy, Desjuan Stearns, MD    02/21/2022 -   Chief Complaint  Patient presents with   Follow-up    Pt states she is doing better compared to last visit. States that she has done two dupixent injections.     HPI Nicole Pokeicole D Owens 41 y.o. -returns for follow-up.  At this point in time she has had 2 doses of Dupixent which means she is being on Dupixent for a month.  Symptoms are already better.  ACT control score has improved nitric oxide breathing test is improved.  She did kickboxing and 1 time she forgot her albuterol that she takes as a precursor to exercise and she felt she was able to power through.  She is wondering if the new nausea she is having is related to Dupixent.  She has been seeing GI for abdominal bloating.  I referred her last time.  Then after starting Dupixent for the last 2 weeks she is having daily morning mild nausea.  GI is working through this.  But she wanted me to check on this.  Referred the issue to the pharmacy team.  We discussed flu shot.  1 time she took flu shot and a few months later got the actual flu.  We discussed the benefits risks and limitations of flu shot and she is agreed to have it.     Asthma Control Panel 04/16/15 10/07/2015  08/01/2017  05/2920 and 12/19/233 06/06/2021  09/09/2021  12/13/2021  02/21/2022   Current Med Regimen   symbicort at 50% compliance symbicort 2puff once dail symbicort 160 at 2 puff bid  Symbicort but not taking Singulair because of nighttime inconvenience Symbicort and sigulair  Symbicort, Singulair and Dupixent  ACT    20 -> 23 17 18 15 22   ACQ 5 point- 1 week. wtd avg score. <1.0 is good control 0.75-1.25 is grey zone. >1.25 poor control. Delta 0.5 is clinically meaningful 0 1.2 0.6 and only with exdrcise       FeNO ppB 21ppb 42 25 ppb   28 ppb 55 15  FeV1           Planned  intervention  for visit  Increase symbi Continue symbi    Contnue symbicort, singulair and start dupixent Continue same but will evaluate for nausea related to Dupixent     Asthma Control Test ACT Total Score  02/21/2022  8:46 AM 22  12/13/2021  3:32 PM 15  09/09/2021  9:45 AM 18     Lab Results  Component Value Date   NITRICOXIDE 15 02/21/2022     PFT     Latest Ref Rng & Units  03/07/2021    1:56 PM 12/01/2013    3:58 PM  PFT Results  FVC-Pre L 2.67  3.13   FVC-Predicted Pre % 78  90   FVC-Post L 2.81  3.20   FVC-Predicted Post % 82  92   Pre FEV1/FVC % % 76  77   Post FEV1/FCV % % 77  79   FEV1-Pre L 2.04  2.40   FEV1-Predicted Pre % 73  83   FEV1-Post L 2.16  2.54   DLCO uncorrected ml/min/mmHg 27.27    DLCO UNC% % 136    DLCO corrected ml/min/mmHg 27.27    DLCO COR %Predicted % 136    DLVA Predicted % 142    TLC L 4.59    TLC % Predicted % 98    RV % Predicted % 128         has a past medical history of Asthma, Asthma, intermittent, Carpal tunnel syndrome on both sides, 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   reports that she has never smoked. She has never used smokeless tobacco.  Past Surgical History:  Procedure Laterality Date   CESAREAN SECTION  02/16/2012   Procedure: CESAREAN SECTION;  Surgeon: Lenoard Aden, MD;  Location: [facility];  Service: Obstetrics;  Laterality: N/A;   HERNIA REPAIR  06/17/2020   hernia repair with mesh and tummy tuck   HIP ARTHROSCOPY Right 08/03/2020   Procedure: Rt hip arthroscopic labrum repair;  Surgeon: Yolonda Kida, MD;  [facility];  Service: Orthopedics;  Laterality: Right;   WISDOM TOOTH EXTRACTION       Allergies  Allergen Reactions   Aspirin Shortness Of Breath and Other (See Comments)    Can trigger asthma, told nurse ibuprofen makes her feel odd and only takes one  tablet if needed    Immunization History  Administered Date(s) Administered   Influenza,inj,Quad PF,6+ Mos 03/11/2013, 04/16/2015   PFIZER(Purple Top)SARS-COV-2 Vaccination 06/07/2019, 06/28/2019   Rho (D) Immune Globulin 02/18/2012   Td 06/17/2008   Varicella 12/07/2010    Family History  Problem Relation Age of Onset   Hypertension Father    Diabetes Father    Obesity Sister    Heart attack Maternal Grandfather    Stroke Paternal Grandfather      Current Outpatient Medications:    albuterol (PROAIR HFA) 108 (90 Base) MCG/ACT inhaler, Inhale 1-2 puffs into the lungs every 6 (six) hours as needed for wheezing or shortness of breath. 1-2 puffs 15 min prior to exercise., Disp: 8 g, Rfl: 6   cetirizine (ZYRTEC) 10 MG tablet, Take 10 mg by mouth as needed., Disp: , Rfl:    Cyanocobalamin (VITAMIN B 12 PO), Take 1 tablet by mouth daily., Disp: , Rfl:    Dupilumab (DUPIXENT) 300 MG/2ML SOPN, Inject 300 mg into the skin every 14 (fourteen) days. Loading dose in clinic on 01/19/22, Disp: 12 mL, Rfl: 1   ELDERBERRY PO, Take 1 each by mouth as needed., Disp: , Rfl:    montelukast (SINGULAIR) 10 MG tablet, TAKE 1 TABLET BY MOUTH AT BEDTIME, Disp: 90 tablet, Rfl: 0   Multiple Vitamin (MULTIVITAMIN WITH MINERALS) TABS tablet, Take 1 tablet by mouth daily., Disp: , Rfl:    SYMBICORT 160-4.5 MCG/ACT inhaler, Inhale 2 puffs into the lungs 2 (two) times daily., Disp:  11 g, Rfl: 5   VITAMIN D PO, Take 1 tablet by mouth daily., Disp: , Rfl:       Objective:   Vitals:   02/21/22 0849  BP: 118/68  Pulse: 71  Temp: 97.9 F (36.6 C)  TempSrc: Oral  SpO2: 98%  Weight: 172 lb (78 kg)  Height: 5' 2.5" (1.588 m)    Estimated body mass index is 30.96 kg/m as calculated from the following:   Height as of this  encounter: 5' 2.5" (1.588 m).   Weight as of this encounter: 172 lb (78 kg).  @WEIGHTCHANGE @    02/21/22 0849  Weight: 172 lb (78 kg)     Physical Exam  General: No distress. Looks well Neuro: Alert and Oriented x 3. GCS 15. Speech normal Psych: Pleasant Resp:  Barrel Chest - no.  Wheeze - no, Crackles - no, No overt respiratory distress CVS: Normal heart sounds. Murmurs - no Ext: Stigmata of Connective Tissue Disease - no HEENT: Normal upper airway. PEERL +. No post nasal drip        Assessment:       ICD-10-CM   1. Asthma, moderate persistent, poorly-controlled  J45.40     2. 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 Elevated IgE level  R76.8     4. Nausea  R11.0          Plan:     Patient Instructions     ICD-10-CM   1. Asthma, moderate persistent, poorly-controlled  J45.40 Nitric oxide    2. 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 Elevated IgE level  R76.8     4. Cat allergies  J30.81        Symptoms  are better and asthma control better after dupixent  Plan -Continue Symbicort at  80/4.5  2 puffs Twice daily - , brush tongue and rinse mouth afterwards.  - monitor control and weight gain -Continue albuterol inhaler 1-2 puffs every 6 hours as needed for shortness of breath or wheezing.  - Take 1-2 puffs 15 minutes before exercise but can try without - warm up and cool down as before with exercise -Continue Zyrtec 10 mg daily as needed for allergies/environmental triggers.  -Continue Singulair 10 mg daily -Continue DUPIXENT - flu shot 02/21/2022   Abdominal Bloating Nausea x 1  month  Plan  - glad you are seeing GI  - will ask our pharmacy team if nausea related to dupixent  Followup  - 3 months or sooner if needed  - ACT test and feno test at followup    SIGNATURE    Dr. 14/07/2021, M.D., F.C.C.P,  Pulmonary and Critical Care Medicine Staff Physician, [facility] Center Director -  Interstitial Lung Disease  Program  Pulmonary Fibrosis [facility] at [facility] [place], [place], [place]  Pager: [phone], If no answer or between  15:00h - 7:00h: call [phone] Telephone: [phone]  9:13 AM 02/21/2022

## 2022-02-21 NOTE — Patient Instructions (Addendum)
ICD-10-CM   1. Asthma, moderate persistent, poorly-controlled  J45.40     2. 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 Elevated IgE level  R76.8     4. Nausea  R11.0     5. Need for immunization against influenza  Z23 Flu Vaccine QUAD 46mo+IM (Fluarix, Fluzone & Alfiuria Quad PF)       Symptoms  are better and asthma control better after dupixent  Plan -Continue Symbicort at  80/4.5  2 puffs Twice daily - , brush tongue and rinse mouth afterwards.  - monitor control and weight gain -Continue albuterol inhaler 1-2 puffs every 6 hours as needed for shortness of breath or wheezing.  - Take 1-2 puffs 15 minutes before exercise but can try without - warm up and cool down as before with exercise -Continue Zyrtec 10 mg daily as needed for allergies/environmental triggers.  -Continue Singulair 10 mg daily -Continue DUPIXENT - flu shot 02/21/2022   Abdominal Bloating Nausea x 1  month  Plan  - glad you are seeing GI  - will ask our pharmacy team if nausea related to dupixent  Followup  - 3 months or sooner if needed  - ACT test and feno test at followup

## 2022-02-21 NOTE — Progress Notes (Signed)
HISTORY OF PRESENT ILLNESS:  Nicole Owens is a 41 y.o. 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  See that dictation for details.  Her symptoms were felt likely related to altered anatomy status post ventral hernia repair with mesh and concurrent abdominoplasty the timing supports this notion.  Blood work and celiac testing were unremarkable.  She did report 20 pound weight gain over the previous 3 years.  Concerns over possible small intestinal bacterial overgrowth led to an empiric trial of Xifaxan for 2 weeks.  This did not help significantly.  Abdominal ultrasound was unremarkable.  Recommendation is to lose weight made.  Follow-up at this time arranged.  Patient continues to be active.  No change in weight.  No longer on FODMAP.  Feels better on gluten-free diet.  Frustrated.  Worried about other potential causes.  Does have daily bowel movements.  Did have 1 episode where she had explosive diarrhea.  Possibly felt better thereafter.  No bleeding.  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   Female infertility 04/30/2010   history restored after error with record merge   FIBROCYSTIC BREAST DISEASE 07/17/2008   Qualifier: Diagnosis of  By: Ermalene Searing MD, Amy     Heartburn in pregnancy    nausea   Multiple gestation - triplets 08/10/2011   Postpartum care following cesarean delivery 02/17/2012   Rh negative, maternal 02/17/2012   S/P cesarean section - triplets 11/29 02/17/2012    Past Surgical History:  Procedure Laterality Date   CESAREAN SECTION  02/16/2012   Procedure: CESAREAN SECTION;  Surgeon: Lenoard Aden, MD;  Location: [facility];  Service: Obstetrics;  Laterality: N/A;   HERNIA REPAIR  06/17/2020   hernia repair with mesh and tummy tuck   HIP ARTHROSCOPY Right 08/03/2020   Procedure: Rt hip arthroscopic labrum repair;  Surgeon: Yolonda Kida, MD;  Location: [facility] OR;  Service: Orthopedics;  Laterality: Right;   WISDOM TOOTH EXTRACTION      Social History Nicole Owens  reports that she has never smoked. She has never used smokeless tobacco. She reports current alcohol use. She reports that she does not use drugs.  family history includes Diabetes in her father; Heart attack in her maternal grandfather; Hypertension in her father; Obesity in her sister; Stroke in her paternal grandfather.  Allergies  Allergen Reactions   Aspirin Shortness Of Breath and Other (See Comments)    Can trigger asthma, told nurse ibuprofen makes her feel odd and only takes one 200mg  tablet if needed       PHYSICAL EXAMINATION: Vital signs: BP 116/80   Pulse 74   Ht 5\' 2"  (1.575 m)   Wt 171 lb (77.6 kg)   SpO2 99%   BMI 31.28 kg/m   Constitutional: generally well-appearing, no acute distress Psychiatric: alert and oriented x3, cooperative Eyes: extraocular movements intact, anicteric, conjunctiva pink Mouth: oral pharynx moist, no lesions Neck: supple no lymphadenopathy Cardiovascular: heart regular rate and rhythm, no murmur Lungs: clear to auscultation bilaterally Abdomen: soft, nontender, nondistended, no obvious ascites, no peritoneal signs, normal bowel sounds, no organomegaly Rectal:  Deferred to colonoscopy Extremities: no clubbing, cyanosis, or lower extremity edema bilaterally Skin: no lesions on visible extremities Neuro: No focal deficits.  Cranial nerves intact  ASSESSMENT:  1.  Chronic abdominal bloating discomfort.  Transient change in bowel habits. 2.  Status post ventral hernia repair with mesh and concurrent abdominoplasty 3.  BMI of 31.  20 pound weight gain over the  past 3 years 4.  No response to Xifaxan 5.  Negative ultrasound   PLAN:  1.  Trial of Linzess 145 mcg daily.  Samples given.  I understand that she is not constipated, but increased bowel movements may help with bloating discomfort.  She can stop this if unwanted side effects or ineffective 2.  Schedule colonoscopy.  This to rule out other causes for her symptom complex which persists.The nature of the procedure, as well as the risks, benefits, and alternatives were carefully and thoroughly reviewed with the patient. Ample time for discussion and questions allowed. The patient understood, was satisfied, and agreed to proceed. 3.  Again encouraged to work toward weight reduction.  I do feel that this will make her abdomen more comfortable given prior hernia repair with mesh and abdominoplasty

## 2022-02-21 NOTE — Patient Instructions (Signed)
_______________________________________________________  If you are age 42 or older, your body mass index should be between 23-30. Your Body mass index is 31.28 kg/m. If this is out of the aforementioned range listed, please consider follow up with your Primary Care Provider.  If you are age 106 or younger, your body mass index should be between 19-25. Your Body mass index is 31.28 kg/m. If this is out of the aformentioned range listed, please consider follow up with your Primary Care Provider.   ________________________________________________________  The [facility] providers would like to encourage you to use [facility] to communicate with providers for non-urgent requests or questions.  Due to long hold times on the telephone, sending your provider a message by [facility] may be a faster and more efficient way to get a response.  Please allow 48 business hours for a response.  Please remember that this is for non-urgent requests.  _______________________________________________________  Nicole Owens have been scheduled for a colonoscopy. Please follow written instructions given to you at your visit today.  Please pick up your prep supplies at the pharmacy within the next 1-3 days. If you use inhalers (even only as needed), please bring them with you on the day of your procedure.

## 2022-02-27 ENCOUNTER — Ambulatory Visit: Payer: Managed Care, Other (non HMO) | Admitting: Gastroenterology

## 2022-03-01 NOTE — Telephone Encounter (Signed)
Called [facility] regarding copay card. Per rep, copay card was billed on 01/31/2022 by [facility]. Phone: [phone], option 1  CORRECT Dupixent copay card:  ID: [id number] Group: [id number] BIN: [id number] PCN: [id number]  Spoke with [facility] regarding Dupixent. Patient due for refill on 03/28/2021 (patient can call for scheduled 03/21/2022).  Chesley Mires, PharmD, MPH, BCPS, CPP Clinical Pharmacist (Rheumatology and Pulmonology)

## 2022-03-21 ENCOUNTER — Telehealth: Payer: Self-pay | Admitting: Internal Medicine

## 2022-03-21 NOTE — Telephone Encounter (Signed)
FYI

## 2022-03-21 NOTE — Telephone Encounter (Signed)
Patient cancelled procedure due to not having a care partner. Will call back to reschedule.

## 2022-03-23 ENCOUNTER — Encounter: Payer: Managed Care, Other (non HMO) | Admitting: Internal Medicine

## 2022-03-27 ENCOUNTER — Ambulatory Visit: Payer: Managed Care, Other (non HMO) | Admitting: Gastroenterology

## 2022-04-18 ENCOUNTER — Ambulatory Visit: Payer: Managed Care, Other (non HMO) | Admitting: Gastroenterology

## 2022-04-26 ENCOUNTER — Other Ambulatory Visit: Payer: Self-pay | Admitting: Internal Medicine

## 2022-04-26 DIAGNOSIS — J45909 Unspecified asthma, uncomplicated: Secondary | ICD-10-CM

## 2022-05-03 NOTE — Telephone Encounter (Signed)
Nausea does not appear to be common side effect of Dupixent. Gastrointestinal side effect incidence per Up to Date:  Diarrhea (3%), gastritis (2%), toothache (1%)  I would deem nausea unrelated to Dupixent  Knox Saliva, PharmD, MPH, BCPS, CPP Clinical Pharmacist (Rheumatology and Pulmonology)

## 2022-06-01 ENCOUNTER — Telehealth: Payer: Self-pay | Admitting: Pharmacist

## 2022-06-01 DIAGNOSIS — J4540 Moderate persistent asthma, uncomplicated: Secondary | ICD-10-CM

## 2022-06-01 MED ORDER — DUPIXENT 300 MG/2ML ~~LOC~~ SOAJ: 300.0000 mg | SUBCUTANEOUS | 0 refills | Status: DC

## 2022-06-01 NOTE — Telephone Encounter (Signed)
Refill sent for DUPIXENT to [place]: [phone]  Dose: 300 mg  SQ every 3 weeks  Last OV: 02/21/22 Provider: Dr. Chase Caller  Next OV: not yet schedule but due  Knox Saliva, PharmD, MPH, BCPS Clinical Pharmacist (Rheumatology and Pulmonology)

## 2022-06-16 ENCOUNTER — Ambulatory Visit
Admission: EM | Admit: 2022-06-16 | Discharge: 2022-06-16 | Disposition: A | Discharge status: Home / Self Care | Payer: Managed Care, Other (non HMO) | Attending: Urgent Care | Admitting: Urgent Care

## 2022-06-16 DIAGNOSIS — B9689 Other specified bacterial agents as the cause of diseases classified elsewhere: Secondary | ICD-10-CM

## 2022-06-16 DIAGNOSIS — J028 Acute pharyngitis due to other specified organisms: Secondary | ICD-10-CM

## 2022-06-16 LAB — POCT RAPID STREP A (OFFICE): Rapid Strep A Screen: POSITIVE — AB

## 2022-06-16 MED ORDER — AMOXICILLIN 500 MG PO CAPS: 500.0000 mg | ORAL_CAPSULE | Freq: Two times a day (BID) | ORAL | 0 refills | Status: AC

## 2022-06-16 NOTE — ED Provider Notes (Signed)
[facility]    CSN: [id number] Arrival date & time: 06/16/22  0807      History   Chief Complaint Chief Complaint  Patient presents with   Sore Throat    HPI Nicole Owens is a 42 y.o. female.    Sore Throat    Presents to urgent care with complaint of sore throat and bodyaches starting 3 days ago.  She states all of her children were positive for strep 2 days ago.  Past Medical History:  Diagnosis Date   Asthma    Asthma, intermittent    mild   Carpal tunnel syndrome on both sides    with pregnancy   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   Female infertility 04/30/2010   history restored after error with record merge   [place] 07/17/2008   Qualifier: Diagnosis of  By: Diona Browner MD, Amy     Heartburn in pregnancy    nausea   Multiple gestation - triplets 08/10/2011   Postpartum care following cesarean delivery 02/17/2012   Rh negative, maternal 02/17/2012   S/P cesarean section - triplets 11/29 02/17/2012    Patient Active Problem List   Diagnosis Date Noted   Other fatigue 10/11/2021   Bilateral arm numbness and tingling while sleeping 07/11/2021   Bloating 07/11/2021   Asthma, 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    Past Surgical History:  Procedure Laterality Date   CESAREAN SECTION  02/16/2012   Procedure: CESAREAN SECTION;  Surgeon: Lovenia Kim, MD;  Location: [facility];  Service: Obstetrics;  Laterality: N/A;   HERNIA REPAIR  06/17/2020   hernia repair with mesh and tummy tuck   HIP ARTHROSCOPY Right 08/03/2020   Procedure: Rt hip arthroscopic labrum repair;  Surgeon: Nicholes Stairs, MD;  Location: [facility];  Service: Orthopedics;  Laterality: Right;   WISDOM TOOTH EXTRACTION      OB History     Gravida  1   Para  1   Term  0   Preterm  1   AB  0   Living  3      SAB  0   IAB  0   Ectopic  0   Multiple  1   Live Births  3            Home Medications    Prior to Admission medications    Medication Sig Start Date End Date Taking? Authorizing Provider  albuterol (PROAIR HFA) 108 (90 Base) MCG/ACT inhaler Inhale 1-2 puffs into the lungs every 6 (six) hours as needed for wheezing or shortness of breath. 1-2 puffs 15 min prior to exercise. 12/15/21  Yes Brand Males, MD  cetirizine (ZYRTEC) 10 MG tablet Take 10 mg by mouth as needed.   Yes [provider]  Cyanocobalamin (VITAMIN B 12 PO) Take 1 tablet by mouth daily.   Yes [provider]  Dupilumab (DUPIXENT) 300 MG/2ML SOPN Inject 300 mg into the skin every 14 (fourteen) days. 06/01/22  Yes Brand Males, MD  ELDERBERRY PO Take 1 each by mouth as needed.   Yes [provider]  montelukast (SINGULAIR) 10 MG tablet TAKE 1 TABLET BY MOUTH AT BEDTIME 04/28/22  Yes Brand Males, MD  Multiple Vitamin (MULTIVITAMIN WITH MINERALS) TABS tablet Take 1 tablet by mouth daily.   Yes [provider]  SYMBICORT 160-4.5 MCG/ACT inhaler Inhale 2 puffs into the lungs 2 (two) times daily. 12/15/21  Yes Brand Males, MD  VITAMIN D PO Take 1 tablet by  mouth daily.   Yes [provider]    Family History Family History  Problem Relation Age of Onset   Hypertension Father    Diabetes Father    Obesity Sister    Heart attack Maternal Grandfather    Stroke Paternal Grandfather     Social History Social History   Tobacco Use   Smoking status: Never   Smokeless tobacco: Never  Vaping Use   Vaping Use: Never used  Substance Use Topics   Alcohol use: Yes    Comment: 1-2 times a week, 1 glass   Drug use: No     Allergies   Aspirin   Review of Systems Review of Systems   Physical Exam Triage Vital Signs ED Triage Vitals [06/16/22 0844]  Enc Vitals Group     BP (!) 155/88     Pulse Rate 81     Resp 18     Temp 98.7 F (37.1 C)     Temp Source Oral     SpO2 98 %     Weight      Height      Head Circumference      Peak Flow      Pain Score 8     Pain Loc       Pain Edu?      Excl. in [place]?    No data found.  Updated Vital Signs BP (!) 155/88 (BP Location: Right Arm)   Pulse 81   Temp 98.7 F (37.1 C) (Oral)   Resp 18   LMP 05/23/2022 (Approximate)   SpO2 98%   Visual Acuity Right Eye Distance:   Left Eye Distance:   Bilateral Distance:    Right Eye Near:   Left Eye Near:    Bilateral Near:     Physical Exam Vitals reviewed.  Constitutional:      Appearance: She is well-developed.  HENT:     Right Ear: Tympanic membrane normal.     Left Ear: Tympanic membrane normal.     Mouth/Throat:     Mouth: Mucous membranes are moist.     Pharynx: Posterior oropharyngeal erythema present. No oropharyngeal exudate.     Tonsils: No tonsillar exudate. 2+ on the right. 2+ on the left.  Cardiovascular:     Rate and Rhythm: Normal rate and regular rhythm.     Heart sounds: Normal heart sounds.  Pulmonary:     Effort: Pulmonary effort is normal.     Breath sounds: Normal breath sounds.  Skin:    General: Skin is warm and dry.  Neurological:     General: No focal deficit present.     Mental Status: She is alert and oriented to person, place, and time.  Psychiatric:        Mood and Affect: Mood normal.        Behavior: Behavior normal.      UC Treatments / Results  Labs (all labs ordered are listed, but only abnormal results are displayed) Labs Reviewed  POCT RAPID STREP A (OFFICE) - Abnormal; Notable for the following components:      Result Value   Rapid Strep A Screen Positive (*)    All other components within normal limits    EKG   Radiology No results found.  Procedures Procedures (including critical care time)  Medications Ordered in UC Medications - No data to display  Initial Impression / Assessment and Plan / UC Course  I have reviewed the triage vital signs  and the nursing notes.  Pertinent labs & imaging results that were available during my care of the patient were reviewed by me and considered in my  medical decision making (see chart for details).   Patient is afebrile here without recent antipyretics. Satting well on room air. Overall is well appearing, well hydrated, without respiratory distress. Pulmonary exam is unremarkable.  Lungs CTAB without wheezing, rhonchi, rales.  Pharyngeal erythema is present.  No obvious peritonsillar exudate.  Rapid strep is positive.  Treating with amoxicillin x 10 days.  Recommending use of OTC medication for symptom control.  Patient acknowledges understanding and agreement with this treatment plan.  Final Clinical Impressions(s) / UC Diagnoses   Final diagnoses:  None   Discharge Instructions   None    ED Prescriptions   None    PDMP not reviewed this encounter.   Rose Phi, [place] 06/16/22 [phone]

## 2022-06-16 NOTE — Discharge Instructions (Addendum)
Follow up here or with your primary care provider if your symptoms are worsening or not improving with treatment.     

## 2022-06-16 NOTE — ED Triage Notes (Signed)
Sore throat, body aches that started Tuesday. Pt states all her kids was positive for strep Wednesday.

## 2022-06-21 ENCOUNTER — Other Ambulatory Visit: Payer: Self-pay | Admitting: Nurse Practitioner

## 2022-06-21 ENCOUNTER — Encounter: Payer: Self-pay | Admitting: Obstetrics and Gynecology

## 2022-06-21 DIAGNOSIS — Z1231 Encounter for screening mammogram for malignant neoplasm of breast: Secondary | ICD-10-CM

## 2022-06-22 ENCOUNTER — Ambulatory Visit
Admission: RE | Admit: 2022-06-22 | Discharge: 2022-06-22 | Disposition: A | Discharge status: Home / Self Care | Payer: Managed Care, Other (non HMO) | Source: Ambulatory Visit | Attending: Nurse Practitioner | Admitting: Nurse Practitioner

## 2022-06-22 DIAGNOSIS — Z1231 Encounter for screening mammogram for malignant neoplasm of breast: Secondary | ICD-10-CM

## 2022-06-23 ENCOUNTER — Encounter: Payer: Self-pay | Admitting: Nurse Practitioner

## 2022-06-23 ENCOUNTER — Other Ambulatory Visit (HOSPITAL_COMMUNITY)
Admission: RE | Admit: 2022-06-23 | Discharge: 2022-06-23 | Disposition: A | Discharge status: Home / Self Care | Payer: Managed Care, Other (non HMO) | Source: Ambulatory Visit | Attending: Nurse Practitioner | Admitting: Nurse Practitioner

## 2022-06-23 ENCOUNTER — Ambulatory Visit: Payer: Managed Care, Other (non HMO) | Admitting: Nurse Practitioner

## 2022-06-23 VITALS — BP 138/84 | HR 79 | Temp 98.1°F | Resp 16 | Ht 62.0 in | Wt 174.5 lb

## 2022-06-23 DIAGNOSIS — E7800 Pure hypercholesterolemia, unspecified: Secondary | ICD-10-CM | POA: Diagnosis not present

## 2022-06-23 DIAGNOSIS — J45909 Unspecified asthma, uncomplicated: Secondary | ICD-10-CM | POA: Diagnosis not present

## 2022-06-23 DIAGNOSIS — Z124 Encounter for screening for malignant neoplasm of cervix: Secondary | ICD-10-CM | POA: Insufficient documentation

## 2022-06-23 DIAGNOSIS — R5383 Other fatigue: Secondary | ICD-10-CM | POA: Diagnosis not present

## 2022-06-23 DIAGNOSIS — Z1321 Encounter for screening for nutritional disorder: Secondary | ICD-10-CM

## 2022-06-23 DIAGNOSIS — E669 Obesity, unspecified: Secondary | ICD-10-CM | POA: Diagnosis not present

## 2022-06-23 DIAGNOSIS — Z23 Encounter for immunization: Secondary | ICD-10-CM | POA: Diagnosis not present

## 2022-06-23 DIAGNOSIS — Z0000 Encounter for general adult medical examination without abnormal findings: Secondary | ICD-10-CM

## 2022-06-23 DIAGNOSIS — R140 Abdominal distension (gaseous): Secondary | ICD-10-CM

## 2022-06-23 DIAGNOSIS — E559 Vitamin D deficiency, unspecified: Secondary | ICD-10-CM | POA: Diagnosis not present

## 2022-06-23 LAB — CBC
HCT: 42.8 % (ref 36.0–46.0)
Hemoglobin: 14.7 g/dL (ref 12.0–15.0)
MCHC: 34.4 g/dL (ref 30.0–36.0)
MCV: 89 fl (ref 78.0–100.0)
Platelets: 293 10*3/uL (ref 150.0–400.0)
RBC: 4.81 Mil/uL (ref 3.87–5.11)
RDW: 12.7 % (ref 11.5–15.5)
WBC: 6.8 10*3/uL (ref 4.0–10.5)

## 2022-06-23 LAB — COMPREHENSIVE METABOLIC PANEL
ALT: 11 U/L (ref 0–35)
AST: 13 U/L (ref 0–37)
Albumin: 4 g/dL (ref 3.5–5.2)
Alkaline Phosphatase: 65 U/L (ref 39–117)
BUN: 14 mg/dL (ref 6–23)
CO2: 26 mEq/L (ref 19–32)
Calcium: 8.7 mg/dL (ref 8.4–10.5)
Chloride: 105 mEq/L (ref 96–112)
Creatinine, Ser: 0.7 mg/dL (ref 0.40–1.20)
GFR: 106.6 mL/min (ref >60.00)
Glucose, Bld: 99 mg/dL (ref 70–99)
Potassium: 4.5 mEq/L (ref 3.5–5.1)
Sodium: 135 mEq/L (ref 135–145)
Total Bilirubin: 0.5 mg/dL (ref 0.2–1.2)
Total Protein: 7.2 g/dL (ref 6.0–8.3)

## 2022-06-23 LAB — LIPID PANEL
Cholesterol: 160 mg/dL (ref 0–200)
HDL: 53.9 mg/dL (ref >39.00)
LDL Cholesterol: 96 mg/dL (ref 0–99)
NonHDL: 106.21
Total CHOL/HDL Ratio: 3
Triglycerides: 53 mg/dL (ref 0.0–149.0)
VLDL: 10.6 mg/dL (ref 0.0–40.0)

## 2022-06-23 LAB — VITAMIN B12: Vitamin B-12: 587 pg/mL (ref 211–911)

## 2022-06-23 LAB — HEMOGLOBIN A1C: Hgb A1c MFr Bld: 5.2 % (ref 4.6–6.5)

## 2022-06-23 LAB — FOLLICLE STIMULATING HORMONE: FSH: 3.9 m[IU]/mL

## 2022-06-23 LAB — VITAMIN D 25 HYDROXY (VIT D DEFICIENCY, FRACTURES): VITD: 25.39 ng/mL — ABNORMAL LOW (ref 30.00–100.00)

## 2022-06-23 LAB — TSH: TSH: 0.86 u[IU]/mL (ref 0.35–5.50)

## 2022-06-23 NOTE — Assessment & Plan Note (Signed)
Patient's last LDL was 122.  Pending lipid panel today

## 2022-06-23 NOTE — Assessment & Plan Note (Signed)
History of the same pending vitamin D level.

## 2022-06-23 NOTE — Assessment & Plan Note (Signed)
Discussed age-appropriate immunizations and screening exams.  Did review patient's personal, family, surgical, social history.  Patient is up-to-date on all vaccines we did update Tdap today.  Updated cervical cancer screening today.  Patient recently had a mammogram.  Patient is too young for CRC screening.  Patient was given information on discharge about preventative healthcare maintenance with anticipatory guidance for age range.

## 2022-06-23 NOTE — Progress Notes (Signed)
Established Patient Office Visit  Subjective   Patient ID: Nicole Owens, female    DOB: [birth year]  Age: 42 y.o. MRN: [id number]  Chief Complaint  Patient presents with   Establish Care    Transferring from Dr.Cody     HPI  Transfer of Care: last seen by Gweneth Dimitri, MD on 10/11/2021 CPE 12/02/2020   Asthma: states that she is followed by pulm every 6 months.  She is currently followed by Dr. Marchelle Gearing and is on albuterol inhaler, Dupixent, Singulair, Symbicort.  Bloating patient was seen by Dr. Selena Batten and GI.  Patient had a second opinion with GI.  She was placed on acid reducing medication for about 6 weeks that seemed to help some.  Patient is avoiding gluten.  States that she does eat gluten and she will feel poorly for the next several days.  States that she has been tested for celiac disease and was negative  for complete physical and follow up of chronic conditions.  Immunizations: -Tetanus: Completed in 10 years ago -Influenza: Completed this season -Shingles: Too young -Pneumonia: Too young -covid: pfizer  Diet: Fair diet. 3 meals a day. States that the desire is not there. States that she feels like she is not eating enough. Snakcs at night. Unsweet tea. Mainly water. coffee  Exercise: 6 days burn boot camp. 45 min classes  Eye exam: Completes annually contacts  Dental exam: Completes semi-annually    Colonoscopy: Too young, currently average risk Lung Cancer Screening: N/A  Mammogram: 06/22/2022.  Patient called GI breast center complaining of right breast pain and wanted diagnostic mammogram looks like a screening mammogram was done encourage patient to call back.  We will proceed with diagnostic mammogram and ultrasound as needed for right breast pain and change in breast size.  Pap smear: 11/25/2019. [facility] Dr Billy Coast.  No history of abnormal Paps.  Patient would like it updated today.      Review of Systems  Constitutional:  Positive for  malaise/fatigue. Negative for chills and fever.  Respiratory:  Negative for shortness of breath.   Cardiovascular:  Negative for chest pain and leg swelling.  Gastrointestinal:  Positive for abdominal pain. Negative for blood in stool, constipation, diarrhea, nausea and vomiting.  Genitourinary:  Negative for dysuria and hematuria.  Neurological:  Negative for tingling and headaches.  Psychiatric/Behavioral:  Negative for hallucinations and suicidal ideas.       Objective:     BP 138/84   Pulse 79   Temp 98.1 F (36.7 C)   Resp 16   Ht 5\' 2"  (1.575 m)   Wt 174 lb 8 oz (79.2 kg)   LMP 05/23/2022 (Approximate)   SpO2 99%   BMI 31.92 kg/m  BP Readings from Last 3 Encounters:  06/23/22 138/84  06/16/22 (!) 155/88  02/21/22 116/80   Wt Readings from Last 3 Encounters:  06/23/22 174 lb 8 oz (79.2 kg)  02/21/22 171 lb (77.6 kg)  02/21/22 172 lb (78 kg)      Physical Exam Vitals and nursing note reviewed. Exam conducted with a chaperone present [employer], cma).  Constitutional:      Appearance: Normal appearance.  HENT:     Right Ear: Tympanic membrane, ear canal and external ear normal.     Left Ear: Tympanic membrane, ear canal and external ear normal.     Mouth/Throat:     Mouth: Mucous membranes are moist.     Pharynx: Oropharynx is clear.  Eyes:  Extraocular Movements: Extraocular movements intact.     Pupils: Pupils are equal, round, and reactive to light.  Cardiovascular:     Rate and Rhythm: Normal rate and regular rhythm.     Pulses: Normal pulses.     Heart sounds: Normal heart sounds.  Pulmonary:     Effort: Pulmonary effort is normal.     Breath sounds: Normal breath sounds.  Abdominal:     General: Bowel sounds are normal. There is no distension.     Palpations: There is no mass.     Tenderness: There is no abdominal tenderness.     Hernia: No hernia is present.  Genitourinary:    Exam position: Lithotomy position.     Vagina: Normal.      Cervix: Normal.     Uterus: Normal.      Adnexa: Right adnexa normal and left adnexa normal.  Musculoskeletal:     Right lower leg: No edema.     Left lower leg: No edema.  Lymphadenopathy:     Cervical: No cervical adenopathy.  Skin:    General: Skin is warm.  Neurological:     General: No focal deficit present.     Mental Status: She is alert.     Deep Tendon Reflexes:     Reflex Scores:      Bicep reflexes are 2+ on the right side and 2+ on the left side.      Patellar reflexes are 2+ on the right side and 2+ on the left side.    Comments: Bilateral upper and lower extremity strength 5/5  Psychiatric:        Mood and Affect: Mood normal.        Behavior: Behavior normal.        Thought Content: Thought content normal.        Judgment: Judgment normal.      No results found for any visits on 06/23/22.    The 10-year ASCVD risk score (Arnett DK, et al., 2019) is: 0.5%    Assessment & Plan:   Problem List Items Addressed This Visit       Respiratory   Asthma, intrinsic    Patient is followed by Dr. Marchelle Gearingamaswamy pulmonology.  Currently maintained on Dupixent, Singulair, butyryl, Symbicort.  Take medication as prescribed follow-up with specialist as recommended        Other   Preventative health care - Primary    Discussed age-appropriate immunizations and screening exams.  Did review patient's personal, family, surgical, social history.  Patient is up-to-date on all vaccines we did update Tdap today.  Updated cervical cancer screening today.  Patient recently had a mammogram.  Patient is too young for CRC screening.  Patient was given information on discharge about preventative healthcare maintenance with anticipatory guidance for age range.      Relevant Orders   CBC   Comprehensive metabolic panel   TSH   Bloating    Has been evaluated by primary care and to GI doctors.  Follow-up with GI      Other fatigue    Patient still having fatigue borderline low B12  has low vitamin D.  Patient like her hormones checked will check FSH and estradiol.      Relevant Orders   Vitamin B12   Follicle Stimulating Hormone   Estrogens, Total   Vitamin D deficiency    History of the same pending vitamin D level.      Relevant Orders   VITAMIN D  25 Hydroxy (Vit-D Deficiency, Fractures)   Obesity (BMI 30-39.9)    Patient is doing healthy lifestyle modifications.  Continue pending A1c lipids and TSH      Relevant Orders   Hemoglobin A1c   Lipid panel   Hypercholesteremia    Patient's last LDL was 122.  Pending lipid panel today      Relevant Orders   Lipid panel   Other Visit Diagnoses     Cervical cancer screening       Relevant Orders   Cytology - PAP(Joyce)   Encounter for vitamin deficiency screening       Relevant Orders   Vitamin B12   Need for tetanus booster       Relevant Orders   Tdap vaccine greater than or equal to 7yo IM (Completed)       Return in about 1 year (around 06/23/2023) for CPE and Labs.    Audria NineMatt Chivonne Rascon, NP

## 2022-06-23 NOTE — Assessment & Plan Note (Signed)
Patient still having fatigue borderline low B12 has low vitamin D.  Patient like her hormones checked will check FSH and estradiol.

## 2022-06-23 NOTE — Assessment & Plan Note (Signed)
Has been evaluated by primary care and to GI doctors.  Follow-up with GI

## 2022-06-23 NOTE — Assessment & Plan Note (Signed)
Patient is doing healthy lifestyle modifications.  Continue pending A1c lipids and TSH

## 2022-06-23 NOTE — Patient Instructions (Signed)
Nice to see you today I will be in touch with the labs once I have them Follow up with me in 1 year for your next physical and labs, sooner if you need me.  We did update your tetanus vaccine today

## 2022-06-23 NOTE — Assessment & Plan Note (Signed)
Patient is followed by Dr. Marchelle Gearing pulmonology.  Currently maintained on Dupixent, Singulair, butyryl, Symbicort.  Take medication as prescribed follow-up with specialist as recommended

## 2022-06-26 ENCOUNTER — Encounter: Payer: Self-pay | Admitting: Nurse Practitioner

## 2022-06-26 ENCOUNTER — Telehealth: Payer: Self-pay | Admitting: Nurse Practitioner

## 2022-06-26 NOTE — Telephone Encounter (Signed)
Noted. I responded via mychart to increase to 4,000 IU daily

## 2022-06-26 NOTE — Telephone Encounter (Signed)
-----   Message from Donnamarie Poag, [place] sent at 06/26/2022  9:15 AM EDT ----- Patient states she is taking 2000IU daily.

## 2022-06-28 LAB — ESTROGENS, TOTAL: Estrogen: 368 pg/mL

## 2022-06-28 LAB — CYTOLOGY - PAP
Adequacy: ABSENT
Comment: NEGATIVE
Diagnosis: NEGATIVE
High risk HPV: NEGATIVE

## 2022-06-30 ENCOUNTER — Ambulatory Visit: Payer: Managed Care, Other (non HMO) | Admitting: Internal Medicine

## 2022-06-30 ENCOUNTER — Encounter: Payer: Self-pay | Admitting: Internal Medicine

## 2022-06-30 VITALS — BP 124/76 | HR 82 | Temp 98.2°F | Ht 62.0 in | Wt 176.2 lb

## 2022-06-30 DIAGNOSIS — J4540 Moderate persistent asthma, uncomplicated: Secondary | ICD-10-CM | POA: Diagnosis not present

## 2022-06-30 DIAGNOSIS — R7689 Other specified abnormal immunological findings in serum: Secondary | ICD-10-CM

## 2022-06-30 DIAGNOSIS — D721 Eosinophilia, unspecified: Secondary | ICD-10-CM | POA: Diagnosis not present

## 2022-06-30 DIAGNOSIS — R768 Other specified abnormal immunological findings in serum: Secondary | ICD-10-CM | POA: Diagnosis not present

## 2022-06-30 LAB — POCT EXHALED NITRIC OXIDE: FeNO level (ppb): 19

## 2022-06-30 NOTE — Progress Notes (Signed)
OV 04/16/2015  Chief Complaint  Patient presents with   Follow-up    Former [facility] pt. Pt states her breathing is doing well. Pt states she only gets SOB if she is exposed to her "normal" triggers - smoke, cold, fragrences. Pt denies cough and CP/tightness.    42 year old mother of triplets were 40 years old she has a history of asthma and used to be followed by Dr. Jonathon Bellows. She is here for routine one-year follow-up. Background history of asthma since middle school. Last admission for asthma or emergency room visit was several years ago. Last prednisone use was a few years ago. She has cat sensitivity but not to the cat she has in her house but only to cats outside her house. She is on Symbicort for a few years. Compliance is around 50-60 percent. Because of triplet she forgets to take them at night. Overall she is doing well with her asthma. She is no longer running because of her busy life with triplets. Asthma control question score is 0 out of 6. In particular she does not wake up at night because of asthma. When she wakes up in the morning she has no symptoms. She is not limited in her activities because of asthma. She does not expands dyspnea because of asthma. And she has not had any wheeze. She has not used albuterol in the last 1 week. In total she is use albuterol 3 times in the last few months.  Exhaled nitric oxide is 21 ppb and is normal.   She has reluctantly agreed to have flu shot    OV 10/07/2015  Chief Complaint  Patient presents with   Follow-up    Pt reports she is unable to do any running w/o having to use her rescue inhaler.     Follow-up moderate persistent asthma on Symbicort  Last seen early 2017. This is a routine 6 month follow-up. She says that now she's been running several times a week 3 miles as part of her routine exercise/marathon preparation. She feels that after I reduced his Symbicort dosage based on 50% compliance and normal exhaled nitric oxide, she feels  that she's having increased symptomatology. In fact asthma control question shows score of 1.2 whereas it was 0 before. She feels that her runs of more difficult. This is despite the fact she warms up. She is also saying that she feels the radius Symbicort is responsible despite factoring in the current heat and humidity and taking albuterol before the runs. Otherwise feels fine no problems.   OV 08/01/2017  Chief Complaint  Patient presents with   Follow-up    Last seen 10/07/15. Pt states she has been doing good and denies any complaints or concerns.   Follow-up moderate persistent asthma on Symbicort  Last seen July 2017.  She then did not follow-up because she has been doing well.  She made this appointment after she started realizing she was running out of Symbicort and call for a refill in the office asked her to make a follow-up.  She tells me that she is 100% compliant with Symbicort 160/4 0.5 to 2 puffs twice daily.  Her albuterol use is rare.  She works out at [employer] and also does running.  She does high intensity interval training.  For all this she does warm up and cool down as advised her at the previous visit.  She also takes albuterol preexercise and during exercise if needed.  Her asthma control questionnaire  is 0.6 on a five-point scale but all of the symptoms are only at exercise.  At baseline she does not wake up at night and when she wakes up she has no symptoms.  She feels very slightly limited because of her asthma and she is a little short of breath because of asthma and it is totally during sprints and workouts.  She does not wheeze and she uses her albuterol 1 or 2 times a week for high intensity workout only.  I have told her that she would need at least annual follow-up    OV 10/08/2019   Subjective:  Patient ID: Nicole Owens, female , DOB: November 24, 1980, age 62 y.o. years. , MRN: [id number],  ADDRESS: [address] [place] [address] PCP  Patient, No Pcp  Per Providers : Treatment Team:  Attending Provider: Kalman Shan, MD   Chief Complaint  Patient presents with   Follow-up    No complaints    Moderate persistent asthma on Symbicort   HPI Nicole Owens 42 y.o. -returns for follow-up.  She continues her Symbicort daily.  Not seen her in 2 years.  She says that she called for a refill and because it has been 2 years the office asked her to make a follow-up visit.  She uses her albuterol for rescue when the weather changes.  She then wakes up in the middle of the night with chest tightness and she needs albuterol.  This happens approximately every 6 weeks or so.  She is only used albuterol for rescue 1 time in the week during the daytime.  She does use albuterol as part of her preexercise regimen.  She feels overall that her asthma is extremely well controlled.  She is willing to go down to the lower dose of Symbicort because she is on the full dose of 160/4.52 puff 2 times daily.    42 year old female, never smoker followed for moderate persistent asthma.  She is a patient of Dr. Jane Canary and was last seen in office on 06/15/2020 by Clent Ridges, NP.  Past medical history significant for ventral hernia status post repair.  TEST/EVENTS:     06/10/2020: Sudie Grumbling with Parrett, NP. Previous 3 weeks with increased SOB, wheezing, and decreased activity tolerance. Increasing rescue inhaler needs. Prednisone burst. Increase ICS dose.   06/15/2020: OV with Parrett, NP. Follow up after asthma exacerbation. Maintained on Symbicort 160 Twice daily. PFTs scheduled. CXR no acute process. Pulmonary risk assessment for preop clearance (ventral hernia repair) - low moderate risk.  03/07/2021: Today - follow up after PFTs Patient presents today for follow up after PFTs which were relatively normal with a possible mild airflow obstruction and no significant BD. She was last seen in March with an asthma exacerbation and increased Symbicort to 160. She reports  feeling better since her last visit and that her breathing has been stable. She does feel that she experiences more shortness of breath with strenuous activity since undergoing her 3 surgeries. She thinks that this is likely related to deconditioning and less her asthma, but does not want to decrease her ICS dose at this time. She occasionally experiences wheezing, which she correlates more so to environmental triggers especially dust versus activity. She denies cough, orthopnea, PND, leg swelling or chest pain. She continues on Symbicort Twice daily and PRN Zyrtec. She uses her rescue inhaler once a week or less. Her ACT is 20 today. She will be cleared to go back to the gym and be off weight  restriction in January. She does continue to walk for exercise in the interim. Overall, she feels well and offers no further complaints.   FeNO 31 ppb   11/2013 PFTs: Normal pulmonary function testing. 03/07/2021 PFTs: FVC 2.81 (82), FEV1 2.16 (78), ratio 77 (93), TLC 4.59 (98), DLCO 27.27 (136). Very minimal airflow obstruction.     OV 06/06/2021  Subjective:  Patient ID: Nicole Owens, female , DOB: 06/25/1980 , age 72 y.o. , MRN: [id number] , ADDRESS: [address] [place] [address] PCP Lynnda Child, MD Patient Care Team: Lynnda Child, MD as PCP - General (Family Medicine) Olivia Mackie, MD as Attending Physician (Obstetrics and Gynecology) Parrett, Virgel Bouquet, NP as Nurse Practitioner (Pulmonary Disease)  This Provider for this visit: Treatment Team:  Attending Provider: Kalman Shan, MD    06/06/2021 -   Chief Complaint  Patient presents with   Follow-up    Pt states she becomes winded carrying things upstairs and winded at other times that she used to not get winded during. Also states that some things she used to be able to do while exercising, she is not able to do as well.   Moderate persistent asthma on Symbicort.  Not fully compliant with Singulair because of nighttime  schedule  HPI Nicole Owens 42 y.o. -returns for follow-up.  I personally not seen her since summer 2021.  She says she is doing "okay".  However she feels that after switching to ProAir based on insurance reasons instead of Ventolin her asthma is not as good as before.  However when I spoke to her I found out that she is not taking her Singulair as directed because she finds it inconvenient to do it at nighttime.  She says she sleeps well but in the daytime is using albuterol at least 2 or 3 times a week.  In addition she is having to do albuterol preexercise.  She does exercise a lot at burn [facility].  She says in 2022 she had 2 visits with nurse practitioners both acute low she did not get steroids.  1 in March 2022 and then again later in 2022.  She also had some surgeries in 2022 namely labrum repair, inguinal hernia repair and sports hernia repair.  She did have COVID in the summer 2022.  However there is no active wheezing or nocturnal symptoms.  ACT score has dropped to 17 today.      OV 09/09/2021  Subjective:  Patient ID: Nicole Owens, female , DOB: 1981-01-26 , age 52 y.o. , MRN: [id number] , ADDRESS: [address] [place] [address] PCP Lynnda Child, MD Patient Care Team: Lynnda Child, MD as PCP - General (Family Medicine) Olivia Mackie, MD as Attending Physician (Obstetrics and Gynecology) Parrett, Virgel Bouquet, NP as Nurse Practitioner (Pulmonary Disease)  This Provider for this visit: Treatment Team:  Attending Provider: Kalman Shan, MD    09/09/2021 -   Chief Complaint  Patient presents with   Follow-up    PT states her SOB has been increased the last couple morning, some wheezing   Moderate persistent asthma on Symbicort and singular HPI Nicole Owens 42 y.o. -last visit March 2023.  At that visit we asked for increase Singulair compliance which she says she is compliant.  She is also taking as Symbicort on regular basis.  She tells me that with  this she thought her asthma was better controlled.  However she feels the higher dose  Symbicort is caused some weight gain.  She also tells me that she ran out of Singulair a few days ago and during this time when she stopped it some abdominal bloating that she had resolved.  Therefore she thinks the Singulair was causing this.  She is back on Singulair for the last few to several days.  This does not cause the return of the abdominal bloating but she is worried that this could return.  She continues on Zyrtec.  She also continues her burn [facility].  She is willing to get RAST allergy panel and blood IgE checked with also blood C. difficile for eosinophils.         OV 12/13/2021  Subjective:  Patient ID: Nicole Owens, female , DOB: 11/22/1980 , age 74 y.o. , MRN: [id number] , ADDRESS: [address] [place] [address] PCP Lynnda Child, MD Patient Care Team: Lynnda Child, MD as PCP - General (Family Medicine) Olivia Mackie, MD as Attending Physician (Obstetrics and Gynecology) Parrett, Virgel Bouquet, NP as Nurse Practitioner (Pulmonary Disease)  This Provider for this visit: Treatment Team:  Attending Provider: Kalman Shan, MD    12/13/2021 -   Chief Complaint  Patient presents with   Follow-up    Pt states that she has had some flare ups since last office visit.     HPI Nicole Owens 42 y.o. -returns for follow-up.  In the interim she is a little more symptomatic.  She says randomly she gets more symptoms.  She does not feel like she needs to be on prednisone she is not taking any prednisone since last visit.  She did take some albuterol and she is better.  Her exhaled nitric oxide today is more elevated and is 55.  She not been on any prednisone no emergency room visits.  Last visit.  Blood work for allergy profile and her IgE is elevated her blood eosinophils also elevated.  She seems to be quite allergic to cats.  She has 3 cats in the house and she is not able  to part company with them.  She reports that she is also having GI issues.  She says when she has gluten her symptoms flareup.  She is try to get in with a gastroenterologist.  I have sent a secure chat to Dr. Loreta Ave to see if she will be able to see her.  She did have ventral hernia repair in early 2022 and then an inguinal hernia repair later in 2022.  She is having difficulty getting to see a gastroenterologist.      Latest Reference Range & Units 12/02/20 08:54 09/15/21 10:44  Eosinophils Absolute 0.0 - 0.5 K/uL 0.3 0.5    Latest Reference Range & Units 09/09/21 10:27  Interpretation  Pend  Sheep Sorrel IgE kU/L <0.10  Pecan/Hickory Tree IgE kU/L <0.10  IgE (Immunoglobulin E), Serum <OR=114 kU/L 230 (H)  Allergen, D pternoyssinus,d7 kU/L 0.27 (H)  Cat Dander kU/L 61.70 (H)  Dog Dander kU/L 2.56 (H)  [place] Grass kU/L <0.10  Johnson Grass kU/L <0.10  Timothy Grass kU/L 0.20 (H)  Cockroach kU/L <0.10  Aspergillus fumigatus, m3 kU/L <0.10  Allergen, Comm Silver Charletta Cousin, t9 kU/L <0.10  Allergen, Cottonwood, t14 kU/L <0.10  Elm IgE kU/L <0.10  Allergen, Mulberry, t76 kU/L <0.10  Allergen, Oak,t7 kU/L <0.10  COMMON RAGWEED (SHORT) (W1) IGE kU/L <0.10  Allergen, Mouse Urine Protein, e78 kU/L <0.10  D. farinae kU/L 0.33 (H)  Allergen, Cedar tree,  t12 kU/L <0.10  Box Elder IgE kU/L <0.10  Rough Pigweed  IgE kU/L <0.10  Allergen, A. alternata, m6 kU/L <0.10  Allergen, P. notatum, m1 kU/L <0.10  CLADOSPORIUM HERBARUM (M2) IGE kU/L <0.10  Class  0/1 0 0 0/1 0 0 0 0 5 2 0 0 0  0 0 0 0 0 0 0 0/1 0 0 0  (H): Data is abnormally high  OV 02/21/2022  Subjective:  Patient ID: Nicole Owens, female , DOB: Feb 17, 1981 , age 57 y.o. , MRN: [id number] , ADDRESS: [address] [place] [address] PCP Gweneth Dimitri, MD Patient Care Team: Gweneth Dimitri, MD as PCP - General (Family Medicine) Olivia Mackie, MD as Attending Physician (Obstetrics and  Gynecology) Parrett, Virgel Bouquet, NP as Nurse Practitioner (Pulmonary Disease)  This Provider for this visit: Treatment Team:  Attending Provider: Kalman Shan, MD    02/21/2022 -   Chief Complaint  Patient presents with   Follow-up    Pt states she is doing better compared to last visit. States that she has done two dupixent injections.     HPI Nicole Owens 42 y.o. -returns for follow-up.  At this point in time she has had 2 doses of Dupixent which means she is being on Dupixent for a month.  Symptoms are already better.  ACT control score has improved nitric oxide breathing test is improved.  She did kickboxing and 1 time she forgot her albuterol that she takes as a precursor to exercise and she felt she was able to power through.  She is wondering if the new nausea she is having is related to Dupixent.  She has been seeing GI for abdominal bloating.  I referred her last time.  Then after starting Dupixent for the last 2 weeks she is having daily morning mild nausea.  GI is working through this.  But she wanted me to check on this.  Referred the issue to the pharmacy team.  We discussed flu shot.  1 time she took flu shot and a few months later got the actual flu.  We discussed the benefits risks and limitations of flu shot and she is agreed to have it.   Asthma Control Test ACT Total Score  02/21/2022  8:46 AM 22  12/13/2021  3:32 PM 15  09/09/2021  9:45 AM 18     Lab Results  Component Value Date   NITRICOXIDE 15 02/21/2022     PFT    OV 06/30/2022  Subjective:  Patient ID: Nicole Owens, female , DOB: 11/08/1980 , age 40 y.o. , MRN: [id number] , ADDRESS: [address] [place] [address] PCP Gweneth Dimitri, MD Patient Care Team: Gweneth Dimitri, MD as PCP - General (Family Medicine) Olivia Mackie, MD as Attending Physician (Obstetrics and Gynecology) Parrett, Virgel Bouquet, NP as Nurse Practitioner (Pulmonary Disease)  This Provider for this visit: Treatment  Team:  Attending Provider: Kalman Shan, MD    06/30/2022 -   Chief Complaint  Patient presents with   Follow-up    Doing well.     HPI Nicole Owens 42 y.o. -presents for follow-up.  This is to see how she is doing on Dupixent is a 14-month follow-up.  She started Dupixent late last year.  She continues to do well.  She says sometimes when she takes Dupixent she does get triggered anxiety.  1 time recently there was some leakage and shortly after that she climb 4 flights of stairs on the  way to mammogram she felt short of breath.  The first time she did climb 4 flights of stairs.  She did not take albuterol before hand.  She was worried about this.  We discussed the most likely reasons may be subtle asthma and also physical deconditioning and also normal variants.  Doubt anything sinister pathology going on but did not advise her to monitor the situation.  Nighttime symptoms are much improved/resolved with Dupixent.  ACT score is 22 and Fino is 19 showing good control.  There are no other new symptoms         Asthma Control Panel 04/16/15 10/07/2015  08/01/2017  05/2920 and 12/19/233 06/06/2021  09/09/2021  12/13/2021  02/21/2022  06/30/2022   Current Med Regimen   symbicort at 50% compliance symbicort 2puff once dail symbicort 160 at 2 puff bid  Symbicort but not taking Singulair because of nighttime inconvenience Symbicort and sigulair  Symbicort, Singulair and Dupixent Symbicort Singulair and Dupixent  ACT    20 -> 23 17 18 15 22 22   ACQ 5 point- 1 week. wtd avg score. <1.0 is good control 0.75-1.25 is grey zone. >1.25 poor control. Delta 0.5 is clinically meaningful 0 1.2 0.6 and only with exdrcise        FeNO ppB 21ppb 42 25 ppb   28 ppb 55 15 19  FeV1            Planned intervention  for visit  Increase symbi Continue symbi    Contnue symbicort, singulair and start dupixent Continue same but will evaluate for nausea related to Dupixent Continue same    PFT     Latest  Ref Rng & Units 03/07/2021    1:56 PM 12/01/2013    3:58 PM  PFT Results  FVC-Pre L 2.67  3.13   FVC-Predicted Pre % 78  90   FVC-Post L 2.81  3.20   FVC-Predicted Post % 82  92   Pre FEV1/FVC % % 76  77   Post FEV1/FCV % % 77  79   FEV1-Pre L 2.04  2.40   FEV1-Predicted Pre % 73  83   FEV1-Post L 2.16  2.54   DLCO uncorrected ml/min/mmHg 27.27    DLCO UNC% % 136    DLCO corrected ml/min/mmHg 27.27    DLCO COR %Predicted % 136    DLVA Predicted % 142    TLC L 4.59    TLC % Predicted % 98    RV % Predicted % 128         has a past medical history of Asthma, Asthma, intermittent, Carpal tunnel syndrome on both sides, 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   reports that she has never smoked. She has never used smokeless tobacco.  Past Surgical History:  Procedure Laterality Date   CESAREAN SECTION  02/16/2012   Procedure: CESAREAN SECTION;  Surgeon: Lenoard Aden, MD;  Location: [facility];  Service: Obstetrics;  Laterality: N/A;   HERNIA REPAIR  06/17/2020   hernia repair with mesh and tummy tuck   HIP ARTHROSCOPY Right 08/03/2020   Procedure: Rt hip arthroscopic labrum repair;  Surgeon: Yolonda Kida, MD;  Location: [facility] OR;  Service: Orthopedics;  Laterality: Right;   INGUINAL HERNIA REPAIR     WISDOM TOOTH EXTRACTION  Allergies  Allergen Reactions   Aspirin Shortness Of Breath and Other (See Comments)    Can trigger asthma, told nurse ibuprofen makes her feel odd and only takes one 200mg  tablet if needed    Immunization History  Administered Date(s) Administered   Influenza,inj,Quad PF,6+ Mos 03/11/2013, 04/16/2015, 02/21/2022   PFIZER(Purple Top)SARS-COV-2 Vaccination  06/07/2019, 06/28/2019   Rho (D) Immune Globulin 02/18/2012   Td 06/17/2008   Tdap 06/23/2022   Varicella 12/07/2010    Family History  Problem Relation Age of Onset   Hypertension Father    Diabetes Father    Obesity Sister    Heart attack Maternal Grandfather    Stroke Paternal Grandfather      Current Outpatient Medications:    albuterol (PROAIR HFA) 108 (90 Base) MCG/ACT inhaler, Inhale 1-2 puffs into the lungs every 6 (six) hours as needed for wheezing or shortness of breath. 1-2 puffs 15 min prior to exercise., Disp: 8 g, Rfl: 6   cetirizine (ZYRTEC) 10 MG tablet, Take 10 mg by mouth as needed., Disp: , Rfl:    Cyanocobalamin (VITAMIN B 12 PO), Take 1 tablet by mouth daily., Disp: , Rfl:    Dupilumab (DUPIXENT) 300 MG/2ML SOPN, Inject 300 mg into the skin every 14 (fourteen) days., Disp: 12 mL, Rfl: 0   ELDERBERRY PO, Take 1 each by mouth as needed., Disp: , Rfl:    montelukast (SINGULAIR) 10 MG tablet, TAKE 1 TABLET BY MOUTH AT BEDTIME, Disp: 90 tablet, Rfl: 0   Multiple Vitamin (MULTIVITAMIN WITH MINERALS) TABS tablet, Take 1 tablet by mouth daily., Disp: , Rfl:    SYMBICORT 160-4.5 MCG/ACT inhaler, Inhale 2 puffs into the lungs 2 (two) times daily., Disp: 11 g, Rfl: 5   VITAMIN D PO, Take 1 tablet by mouth daily. Taking 2000 UI daily, Disp: , Rfl:       Objective:   Vitals:   06/30/22 0853  BP: 124/76  Pulse: 82  Temp: 98.2 F (36.8 C)  TempSrc: Oral  SpO2: 98%  Weight: 176 lb 3.2 oz (79.9 kg)  Height: 5\' 2"  (1.575 m)    Estimated body mass index is 32.23 kg/m as calculated from the following:   Height as of this encounter: 5\' 2"  (1.575 m).   Weight as of this encounter: 176 lb 3.2 oz (79.9 kg).  @WEIGHTCHANGE @  [employer]   06/30/22 0853  Weight: 176 lb 3.2 oz (79.9 kg)     Physical Exam   General: No distress. Looks well Neuro: Alert and Oriented x 3. GCS 15. Speech normal Psych: Pleasant Resp:  Barrel Chest - no.  Wheeze - no, Crackles -  no, No overt respiratory distress CVS: Normal heart sounds. Murmurs - no Ext: Stigmata of Connective Tissue Disease - no HEENT: Normal upper airway. PEERL +. No post nasal drip        Assessment:       ICD-10-CM   1. Asthma, moderate persistent, poorly-controlled  J45.40 POCT EXHALED NITRIC OXIDE    2. 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 Elevated IgE level  R76.8          Plan:     Patient Instructions     ICD-10-CM   1. Asthma, moderate persistent, poorly-controlled  J45.40 POCT EXHALED NITRIC OXIDE    2. 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 Elevated IgE level  R76.8         Symptoms  are better and asthma control  better after dupixent Shortness of breath climbing 4 flights stairs is likely due to fitness and +- asthma  Plan -Continue Symbicort at  80/4.5  2 puffs Twice daily - , brush tongue and rinse mouth afterwards.  - monitor control and weight gain -Continue albuterol inhaler 1-2 puffs every 6 hours as needed for shortness of breath or wheezing.  - Take 1-2 puffs 15 minutes before exercise but can try without - warm up and cool down as before with exercise inclucing climbing stairs -Continue Zyrtec 10 mg daily as needed for allergies/environmental triggers.  -Continue Singulair 10 mg daily -Continue DUPIXENT  Followup  - 6 months or sooner if needed  - ACT test and feno test at followup; early 2025 can look at reducing dupixent dose    SIGNATURE    Dr. Kalman Shan, M.D., F.C.C.P,  Pulmonary and Critical Care Medicine Staff Physician, [facility] Director - Interstitial Lung Disease  Program  Pulmonary Fibrosis [facility] at [facility] [place], [place], [address]  Pager: [phone], If no answer or between  15:00h - 7:00h: call [phone] Telephone: [phone]  9:17 AM 06/30/2022

## 2022-06-30 NOTE — Patient Instructions (Addendum)
ICD-10-CM   1. Asthma, moderate persistent, poorly-controlled  J45.40 POCT EXHALED NITRIC OXIDE    2. 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 Elevated IgE level  R76.8         Symptoms  are better and asthma control better after dupixent Shortness of breath climbing 4 flights stairs is likely due to fitness and +- asthma  Plan -Continue Symbicort at  80/4.5  2 puffs Twice daily - , brush tongue and rinse mouth afterwards.  - monitor control and weight gain -Continue albuterol inhaler 1-2 puffs every 6 hours as needed for shortness of breath or wheezing.  - Take 1-2 puffs 15 minutes before exercise but can try without - warm up and cool down as before with exercise inclucing climbing stairs -Continue Zyrtec 10 mg daily as needed for allergies/environmental triggers.  -Continue Singulair 10 mg daily -Continue DUPIXENT  Followup  - 6 months or sooner if needed  - ACT test and feno test at followup; early 2025 can look at reducing dupixent dose

## 2022-07-19 ENCOUNTER — Other Ambulatory Visit: Payer: Self-pay | Admitting: Pharmacist

## 2022-07-19 DIAGNOSIS — J4540 Moderate persistent asthma, uncomplicated: Secondary | ICD-10-CM

## 2022-07-19 MED ORDER — DUPIXENT 300 MG/2ML ~~LOC~~ SOAJ
300.0000 mg | SUBCUTANEOUS | 1 refills | Status: DC
Start: 2022-07-19 — End: 2023-02-02

## 2022-07-22 ENCOUNTER — Other Ambulatory Visit: Payer: Self-pay | Admitting: Internal Medicine

## 2022-07-22 DIAGNOSIS — J45909 Unspecified asthma, uncomplicated: Secondary | ICD-10-CM

## 2022-08-22 ENCOUNTER — Encounter: Payer: Self-pay | Admitting: Nurse Practitioner

## 2022-08-22 DIAGNOSIS — N6459 Other signs and symptoms in breast: Secondary | ICD-10-CM

## 2022-09-22 ENCOUNTER — Other Ambulatory Visit: Payer: Self-pay | Admitting: Nurse Practitioner

## 2022-09-22 DIAGNOSIS — N6459 Other signs and symptoms in breast: Secondary | ICD-10-CM

## 2022-09-22 DIAGNOSIS — N63 Unspecified lump in unspecified breast: Secondary | ICD-10-CM

## 2022-10-03 ENCOUNTER — Ambulatory Visit
Admission: RE | Admit: 2022-10-03 | Discharge: 2022-10-03 | Disposition: A | Discharge status: Home / Self Care | Payer: Managed Care, Other (non HMO) | Source: Ambulatory Visit | Attending: Nurse Practitioner

## 2022-10-03 ENCOUNTER — Ambulatory Visit
Admission: RE | Admit: 2022-10-03 | Discharge: 2022-10-03 | Disposition: A | Discharge status: Home / Self Care | Payer: Managed Care, Other (non HMO) | Source: Ambulatory Visit | Attending: Nurse Practitioner | Admitting: Nurse Practitioner

## 2022-10-03 DIAGNOSIS — N63 Unspecified lump in unspecified breast: Secondary | ICD-10-CM

## 2022-10-03 DIAGNOSIS — N6459 Other signs and symptoms in breast: Secondary | ICD-10-CM

## 2022-12-18 ENCOUNTER — Telehealth: Payer: Self-pay | Admitting: Pharmacist

## 2022-12-18 NOTE — Telephone Encounter (Signed)
Received notification from [facility] [employer] regarding a prior authorization for DUPIXENT. Authorization has been APPROVED from 12/18/2022 to 12/18/2023.  Patient must continue to fill through [facility]: [phone]  Authorization # [id number]  Chesley Mires, PharmD, MPH, BCPS, CPP Clinical Pharmacist (Rheumatology and Pulmonology)

## 2023-02-02 ENCOUNTER — Telehealth: Payer: Self-pay | Admitting: Internal Medicine

## 2023-02-02 DIAGNOSIS — J4540 Moderate persistent asthma, uncomplicated: Secondary | ICD-10-CM

## 2023-02-02 NOTE — Telephone Encounter (Signed)
Refill sent for DUPIXENT to [facility]: [phone]  Dose: 300mg  SQ every 14 day  Last OV: 06/30/22 Provider: Dr. Marchelle Gearing  Next OV: was due in Oct 2024  Routing to scheduling team for follow-up on appt scheduling  Nicole Owens, PharmD, MPH, BCPS Clinical Pharmacist (Rheumatology and Pulmonology)

## 2023-02-05 ENCOUNTER — Ambulatory Visit (INDEPENDENT_AMBULATORY_CARE_PROVIDER_SITE_OTHER): Payer: Managed Care, Other (non HMO) | Admitting: Internal Medicine

## 2023-02-05 ENCOUNTER — Encounter: Payer: Self-pay | Admitting: Internal Medicine

## 2023-02-05 VITALS — BP 148/85 | HR 86 | Ht 63.0 in | Wt 182.0 lb

## 2023-02-05 DIAGNOSIS — Z23 Encounter for immunization: Secondary | ICD-10-CM | POA: Diagnosis not present

## 2023-02-05 DIAGNOSIS — R768 Other specified abnormal immunological findings in serum: Secondary | ICD-10-CM

## 2023-02-05 DIAGNOSIS — D721 Eosinophilia, unspecified: Secondary | ICD-10-CM

## 2023-02-05 DIAGNOSIS — J4540 Moderate persistent asthma, uncomplicated: Secondary | ICD-10-CM | POA: Diagnosis not present

## 2023-02-05 DIAGNOSIS — Z7185 Encounter for immunization safety counseling: Secondary | ICD-10-CM

## 2023-02-05 NOTE — Patient Instructions (Addendum)
ICD-10-CM   1. Asthma, moderate persistent, poorly-controlled  J45.40 POCT EXHALED NITRIC OXIDE    2. 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 Elevated IgE level  R76.8       Much improved asthma and well controlled Some mild symptoms during plank exercise that seems to have strted after your hernia surgery   Plan - flu shot 02/05/2023   - STOP SINGULAIR -Continue Symbicort at  80/4.5  2 puffs Twice daily - , brush tongue and rinse mouth afterwards.  - monitor control and weight gain -Continue albuterol inhaler 1-2 puffs every 6 hours as needed for shortness of breath or wheezing.  - Take 1-2 puffs 15 minutes before exercise but can try without - warm up and cool down as before with exercise inclucing climbing stairs -Continue Zyrtec 10 mg daily as needed for allergies/environmental triggers.  --Continue DUPIXENT  Followup  - 9 months or sooner if needed  - ACT test and feno test at followup; can look at reducing dupixent dose if doing well

## 2023-02-05 NOTE — Progress Notes (Signed)
OV 04/16/2015  Chief Complaint  Patient presents with   Follow-up    Former [facility] pt. Pt states her breathing is doing well. Pt states she only gets SOB if she is exposed to her "normal" triggers - smoke, cold, fragrences. Pt denies cough and CP/tightness.    42 year old mother of triplets were 21 years old she has a history of asthma and used to be followed by Dr. Jonathon Bellows. She is here for routine one-year follow-up. Background history of asthma since middle school. Last admission for asthma or emergency room visit was several years ago. Last prednisone use was a few years ago. She has cat sensitivity but not to the cat she has in her house but only to cats outside her house. She is on Symbicort for a few years. Compliance is around 50-60 percent. Because of triplet she forgets to take them at night. Overall she is doing well with her asthma. She is no longer running because of her busy life with triplets. Asthma control question score is 0 out of 6. In particular she does not wake up at night because of asthma. When she wakes up in the morning she has no symptoms. She is not limited in her activities because of asthma. She does not expands dyspnea because of asthma. And she has not had any wheeze. She has not used albuterol in the last 1 week. In total she is use albuterol 3 times in the last few months.  Exhaled nitric oxide is 21 ppb and is normal.   She has reluctantly agreed to have flu shot    OV 10/07/2015  Chief Complaint  Patient presents with   Follow-up    Pt reports she is unable to do any running w/o having to use her rescue inhaler.     Follow-up moderate persistent asthma on Symbicort  Last seen early 2017. This is a routine 6 month follow-up. She says that now she's been running several times a week 3 miles as part of her routine exercise/marathon preparation. She feels that after I reduced his Symbicort dosage based on 50% compliance and normal exhaled nitric oxide, she feels  that she's having increased symptomatology. In fact asthma control question shows score of 1.2 whereas it was 0 before. She feels that her runs of more difficult. This is despite the fact she warms up. She is also saying that she feels the radius Symbicort is responsible despite factoring in the current heat and humidity and taking albuterol before the runs. Otherwise feels fine no problems.   OV 08/01/2017  Chief Complaint  Patient presents with   Follow-up    Last seen 10/07/15. Pt states she has been doing good and denies any complaints or concerns.   Follow-up moderate persistent asthma on Symbicort  Last seen July 2017.  She then did not follow-up because she has been doing well.  She made this appointment after she started realizing she was running out of Symbicort and call for a refill in the office asked her to make a follow-up.  She tells me that she is 100% compliant with Symbicort 160/4 0.5 to 2 puffs twice daily.  Her albuterol use is rare.  She works out at [employer] and also does running.  She does high intensity interval training.  For all this she does warm up and cool down as advised her at the previous visit.  She also takes albuterol preexercise and during exercise if needed.  Her asthma control questionnaire  is 0.6 on a five-point scale but all of the symptoms are only at exercise.  At baseline she does not wake up at night and when she wakes up she has no symptoms.  She feels very slightly limited because of her asthma and she is a little short of breath because of asthma and it is totally during sprints and workouts.  She does not wheeze and she uses her albuterol 1 or 2 times a week for high intensity workout only.  I have told her that she would need at least annual follow-up    OV 10/08/2019   Subjective:  Patient ID: Nicole Owens, female , DOB: January 19, 1981, age 16 y.o. years. , MRN: [id number],  ADDRESS: [address] [place] [address] PCP  Patient, No Pcp  Per Providers : Treatment Team:  Attending Provider: Kalman Shan, MD   Chief Complaint  Patient presents with   Follow-up    No complaints    Moderate persistent asthma on Symbicort   HPI Nicole Owens 42 y.o. -returns for follow-up.  She continues her Symbicort daily.  Not seen her in 2 years.  She says that she called for a refill and because it has been 2 years the office asked her to make a follow-up visit.  She uses her albuterol for rescue when the weather changes.  She then wakes up in the middle of the night with chest tightness and she needs albuterol.  This happens approximately every 6 weeks or so.  She is only used albuterol for rescue 1 time in the week during the daytime.  She does use albuterol as part of her preexercise regimen.  She feels overall that her asthma is extremely well controlled.  She is willing to go down to the lower dose of Symbicort because she is on the full dose of 160/4.52 puff 2 times daily.    42 year old female, never smoker followed for moderate persistent asthma.  She is a patient of Dr. Jane Canary and was last seen in office on 06/15/2020 by Clent Ridges, NP.  Past medical history significant for ventral hernia status post repair.  TEST/EVENTS:     06/10/2020: Sudie Grumbling with Parrett, NP. Previous 3 weeks with increased SOB, wheezing, and decreased activity tolerance. Increasing rescue inhaler needs. Prednisone burst. Increase ICS dose.   06/15/2020: OV with Parrett, NP. Follow up after asthma exacerbation. Maintained on Symbicort 160 Twice daily. PFTs scheduled. CXR no acute process. Pulmonary risk assessment for preop clearance (ventral hernia repair) - low moderate risk.  03/07/2021: Today - follow up after PFTs Patient presents today for follow up after PFTs which were relatively normal with a possible mild airflow obstruction and no significant BD. She was last seen in March with an asthma exacerbation and increased Symbicort to 160. She reports  feeling better since her last visit and that her breathing has been stable. She does feel that she experiences more shortness of breath with strenuous activity since undergoing her 3 surgeries. She thinks that this is likely related to deconditioning and less her asthma, but does not want to decrease her ICS dose at this time. She occasionally experiences wheezing, which she correlates more so to environmental triggers especially dust versus activity. She denies cough, orthopnea, PND, leg swelling or chest pain. She continues on Symbicort Twice daily and PRN Zyrtec. She uses her rescue inhaler once a week or less. Her ACT is 20 today. She will be cleared to go back to the gym and be off weight  restriction in January. She does continue to walk for exercise in the interim. Overall, she feels well and offers no further complaints.   FeNO 31 ppb   11/2013 PFTs: Normal pulmonary function testing. 03/07/2021 PFTs: FVC 2.81 (82), FEV1 2.16 (78), ratio 77 (93), TLC 4.59 (98), DLCO 27.27 (136). Very minimal airflow obstruction.     OV 06/06/2021  Subjective:  Patient ID: Nicole Owens, female , DOB: 1980/07/10 , age 31 y.o. , MRN: [id number] , ADDRESS: [address] [place] [address] PCP Lynnda Child, MD Patient Care Team: Lynnda Child, MD as PCP - General (Family Medicine) Olivia Mackie, MD as Attending Physician (Obstetrics and Gynecology) Parrett, Virgel Bouquet, NP as Nurse Practitioner (Pulmonary Disease)  This Provider for this visit: Treatment Team:  Attending Provider: Kalman Shan, MD    06/06/2021 -   Chief Complaint  Patient presents with   Follow-up    Pt states she becomes winded carrying things upstairs and winded at other times that she used to not get winded during. Also states that some things she used to be able to do while exercising, she is not able to do as well.   Moderate persistent asthma on Symbicort.  Not fully compliant with Singulair because of nighttime  schedule  HPI Nicole Owens 42 y.o. -returns for follow-up.  I personally not seen her since summer 2021.  She says she is doing "okay".  However she feels that after switching to ProAir based on insurance reasons instead of Ventolin her asthma is not as good as before.  However when I spoke to her I found out that she is not taking her Singulair as directed because she finds it inconvenient to do it at nighttime.  She says she sleeps well but in the daytime is using albuterol at least 2 or 3 times a week.  In addition she is having to do albuterol preexercise.  She does exercise a lot at burn [facility].  She says in 2022 she had 2 visits with nurse practitioners both acute low she did not get steroids.  1 in March 2022 and then again later in 2022.  She also had some surgeries in 2022 namely labrum repair, inguinal hernia repair and sports hernia repair.  She did have COVID in the summer 2022.  However there is no active wheezing or nocturnal symptoms.  ACT score has dropped to 17 today.      OV 09/09/2021  Subjective:  Patient ID: Nicole Owens, female , DOB: 1980/10/13 , age 1 y.o. , MRN: [id number] , ADDRESS: [address] [place] [address] PCP Lynnda Child, MD Patient Care Team: Lynnda Child, MD as PCP - General (Family Medicine) Olivia Mackie, MD as Attending Physician (Obstetrics and Gynecology) Parrett, Virgel Bouquet, NP as Nurse Practitioner (Pulmonary Disease)  This Provider for this visit: Treatment Team:  Attending Provider: Kalman Shan, MD    09/09/2021 -   Chief Complaint  Patient presents with   Follow-up    PT states her SOB has been increased the last couple morning, some wheezing   Moderate persistent asthma on Symbicort and singular HPI Nicole Owens 42 y.o. -last visit March 2023.  At that visit we asked for increase Singulair compliance which she says she is compliant.  She is also taking as Symbicort on regular basis.  She tells me that with  this she thought her asthma was better controlled.  However she feels the higher dose  Symbicort is caused some weight gain.  She also tells me that she ran out of Singulair a few days ago and during this time when she stopped it some abdominal bloating that she had resolved.  Therefore she thinks the Singulair was causing this.  She is back on Singulair for the last few to several days.  This does not cause the return of the abdominal bloating but she is worried that this could return.  She continues on Zyrtec.  She also continues her burn [facility].  She is willing to get RAST allergy panel and blood IgE checked with also blood C. difficile for eosinophils.         OV 12/13/2021  Subjective:  Patient ID: Nicole Owens, female , DOB: 06-08-1980 , age 28 y.o. , MRN: [id number] , ADDRESS: [address] [place] [address] PCP Lynnda Child, MD Patient Care Team: Lynnda Child, MD as PCP - General (Family Medicine) Olivia Mackie, MD as Attending Physician (Obstetrics and Gynecology) Parrett, Virgel Bouquet, NP as Nurse Practitioner (Pulmonary Disease)  This Provider for this visit: Treatment Team:  Attending Provider: Kalman Shan, MD    12/13/2021 -   Chief Complaint  Patient presents with   Follow-up    Pt states that she has had some flare ups since last office visit.     HPI Nicole Owens 42 y.o. -returns for follow-up.  In the interim she is a little more symptomatic.  She says randomly she gets more symptoms.  She does not feel like she needs to be on prednisone she is not taking any prednisone since last visit.  She did take some albuterol and she is better.  Her exhaled nitric oxide today is more elevated and is 55.  She not been on any prednisone no emergency room visits.  Last visit.  Blood work for allergy profile and her IgE is elevated her blood eosinophils also elevated.  She seems to be quite allergic to cats.  She has 3 cats in the house and she is not able  to part company with them.  She reports that she is also having GI issues.  She says when she has gluten her symptoms flareup.  She is try to get in with a gastroenterologist.  I have sent a secure chat to Dr. Loreta Ave to see if she will be able to see her.  She did have ventral hernia repair in early 2022 and then an inguinal hernia repair later in 2022.  She is having difficulty getting to see a gastroenterologist.      Latest Reference Range & Units 12/02/20 08:54 09/15/21 10:44  Eosinophils Absolute 0.0 - 0.5 K/uL 0.3 0.5    Latest Reference Range & Units 09/09/21 10:27  Interpretation  Pend  Sheep Sorrel IgE kU/L <0.10  Pecan/Hickory Tree IgE kU/L <0.10  IgE (Immunoglobulin E), Serum <OR=114 kU/L 230 (H)  Allergen, D pternoyssinus,d7 kU/L 0.27 (H)  Cat Dander kU/L 61.70 (H)  Dog Dander kU/L 2.56 (H)  [place] Grass kU/L <0.10  Johnson Grass kU/L <0.10  Timothy Grass kU/L 0.20 (H)  Cockroach kU/L <0.10  Aspergillus fumigatus, m3 kU/L <0.10  Allergen, Comm Silver Charletta Cousin, t9 kU/L <0.10  Allergen, Cottonwood, t14 kU/L <0.10  Elm IgE kU/L <0.10  Allergen, Mulberry, t76 kU/L <0.10  Allergen, Oak,t7 kU/L <0.10  COMMON RAGWEED (SHORT) (W1) IGE kU/L <0.10  Allergen, Mouse Urine Protein, e78 kU/L <0.10  D. farinae kU/L 0.33 (H)  Allergen, Cedar tree,  t12 kU/L <0.10  Box Elder IgE kU/L <0.10  Rough Pigweed  IgE kU/L <0.10  Allergen, A. alternata, m6 kU/L <0.10  Allergen, P. notatum, m1 kU/L <0.10  CLADOSPORIUM HERBARUM (M2) IGE kU/L <0.10  Class  0/1 0 0 0/1 0 0 0 0 5 2 0 0 0  0 0 0 0 0 0 0 0/1 0 0 0  (H): Data is abnormally high  OV 02/21/2022  Subjective:  Patient ID: Nicole Owens, female , DOB: 09-10-1980 , age 61 y.o. , MRN: [id number] , ADDRESS: [address] [place] [address] PCP Gweneth Dimitri, MD Patient Care Team: Gweneth Dimitri, MD as PCP - General (Family Medicine) Olivia Mackie, MD as Attending Physician (Obstetrics and  Gynecology) Parrett, Virgel Bouquet, NP as Nurse Practitioner (Pulmonary Disease)  This Provider for this visit: Treatment Team:  Attending Provider: Kalman Shan, MD    02/21/2022 -   Chief Complaint  Patient presents with   Follow-up    Pt states she is doing better compared to last visit. States that she has done two dupixent injections.     HPI Nicole Owens 42 y.o. -returns for follow-up.  At this point in time she has had 2 doses of Dupixent which means she is being on Dupixent for a month.  Symptoms are already better.  ACT control score has improved nitric oxide breathing test is improved.  She did kickboxing and 1 time she forgot her albuterol that she takes as a precursor to exercise and she felt she was able to power through.  She is wondering if the new nausea she is having is related to Dupixent.  She has been seeing GI for abdominal bloating.  I referred her last time.  Then after starting Dupixent for the last 2 weeks she is having daily morning mild nausea.  GI is working through this.  But she wanted me to check on this.  Referred the issue to the pharmacy team.  We discussed flu shot.  1 time she took flu shot and a few months later got the actual flu.  We discussed the benefits risks and limitations of flu shot and she is agreed to have it.    PFT    OV 06/30/2022  Subjective:  Patient ID: Nicole Owens, female , DOB: [birth year] , age 69 y.o. , MRN: [id number] , ADDRESS: [address] [place] [address] PCP Gweneth Dimitri, MD Patient Care Team: Gweneth Dimitri, MD as PCP - General (Family Medicine) Olivia Mackie, MD as Attending Physician (Obstetrics and Gynecology) Parrett, Virgel Bouquet, NP as Nurse Practitioner (Pulmonary Disease)  This Provider for this visit: Treatment Team:  Attending Provider: Kalman Shan, MD    06/30/2022 -   Chief Complaint  Patient presents with   Follow-up    Doing well.     HPI Nicole Owens 42 y.o. -presents for  follow-up.  This is to see how she is doing on Dupixent is a 54-month follow-up.  She started Dupixent late last year.  She continues to do well.  She says sometimes when she takes Dupixent she does get triggered anxiety.  1 time recently there was some leakage and shortly after that she climb 4 flights of stairs on the way to mammogram she felt short of breath.  The first time she did climb 4 flights of stairs.  She did not take albuterol before hand.  She was worried about this.  We discussed the most likely reasons may be  subtle asthma and also physical deconditioning and also normal variants.  Doubt anything sinister pathology going on but did not advise her to monitor the situation.  Nighttime symptoms are much improved/resolved with Dupixent.  ACT score is 22 and Fino is 19 showing good control.  There are no other new symptoms     OV 02/05/2023  Subjective:  Patient ID: Nicole Owens, female , DOB: Jul 31, 1980 , age 39 y.o. , MRN: [id number] , ADDRESS: [address] [place] [address] PCP Gweneth Dimitri, MD Patient Care Team: Gweneth Dimitri, MD as PCP - General (Family Medicine) Olivia Mackie, MD as Attending Physician (Obstetrics and Gynecology) Parrett, Virgel Bouquet, NP as Nurse Practitioner (Pulmonary Disease)  This Provider for this visit: Treatment Team:  Attending Provider: Kalman Shan, MD    02/05/2023 -   Chief Complaint  Patient presents with   Follow-up    Astham f/u, pt states she has been doing good. Using inhaler as needed      HPI Nicole Owens 42 y.o. -returns for follow-up.  Her asthma is under control.  She says that ever since she did the hernia surgery when she does some plank exercises she feels a little short of breath.  Last time I did advise her to do some preexercise warm up and albuterol usage but it does not appear she is doing that.  Outside of that the ACT's control score is 24 showing excellent control.  No albuterol rescue use.  Overall  much better with Dupixent.  She continues Symbicort and also Singulair..  The only other issues that she fell down while rollerblading and hurt her left knee.  Since then she is having difficulty doing heart core exercises.  Of note she will have a flu shot today.        Asthma Control Panel 04/16/15 10/07/2015  08/01/2017  05/2920 and 12/19/233 06/06/2021  09/09/2021  12/13/2021  02/21/2022  06/30/2022  02/05/2023   Current Med Regimen   symbicort at 50% compliance symbicort 2puff once dail symbicort 160 at 2 puff bid  Symbicort but not taking Singulair because of nighttime inconvenience Symbicort and sigulair  Symbicort, Singulair and Dupixent Symbicort Singulair and Dupixent Symbicort, Singulair, Dupixent and Zyrtec  ACT    20 -> 23 17 18 15 22 22 24   ACQ 5 point- 1 week. wtd avg score. <1.0 is good control 0.75-1.25 is grey zone. >1.25 poor control. Delta 0.5 is clinically meaningful 0 1.2 0.6 and only with exdrcise         FeNO ppB 21ppb 42 25 ppb   28 ppb 55 15 19   FeV1             Planned intervention  for visit  Increase symbi Continue symbi    Contnue symbicort, singulair and start dupixent Continue same but will evaluate for nausea related to Dupixent Continue same Stop Singulair but continue the rest   PFT     Latest Ref Rng & Units 03/07/2021    1:56 PM 12/01/2013    3:58 PM  ILD indicators  FVC-Pre L 2.67  3.13   FVC-Predicted Pre % 78  90   FVC-Post L 2.81  3.20   FVC-Predicted Post % 82  92   TLC L 4.59    TLC Predicted % 98    DLCO uncorrected ml/min/mmHg 27.27    DLCO UNC %Pred % 136    DLCO Corrected ml/min/mmHg 27.27    DLCO COR %Pred % 136  LAB RESULTS last 96 hours No results found.  LAB RESULTS last 90 days No results found for this or any previous visit (from the past 2160 hour(s)).       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   reports that she has never smoked. She has never used smokeless tobacco.  Past Surgical History:  Procedure Laterality Date   CESAREAN SECTION  02/16/2012   Procedure: CESAREAN SECTION;  Surgeon: Lenoard Aden, MD;  Location: [facility];  Service: Obstetrics;  Laterality: N/A;   HERNIA REPAIR  06/17/2020   hernia repair with mesh and tummy tuck   HIP ARTHROSCOPY Right 08/03/2020   Procedure: Rt hip arthroscopic labrum repair;  Surgeon: Yolonda Kida, MD;  Location: [facility];  Service: Orthopedics;  Laterality: Right;   INGUINAL HERNIA REPAIR     WISDOM TOOTH EXTRACTION      Allergies  Allergen Reactions   Aspirin Shortness Of Breath and Other (See Comments)    Can trigger asthma, told nurse ibuprofen makes her feel odd and only takes one 200mg  tablet if needed    Immunization History  Administered Date(s) Administered   Influenza,inj,Quad PF,6+ Mos 03/11/2013, 04/16/2015, 02/21/2022   PFIZER(Purple Top)SARS-COV-2 Vaccination 06/07/2019, 06/28/2019   Rho (D) Immune Globulin 02/18/2012   Td 06/17/2008   Tdap 06/23/2022   Varicella 12/07/2010    Family History  Problem Relation Age of Onset   Hypertension Father    Diabetes Father    Obesity Sister    Heart attack Maternal Grandfather    Stroke Paternal Grandfather      Current Outpatient Medications:    albuterol (PROAIR HFA) 108 (90 Base) MCG/ACT inhaler, Inhale 1-2 puffs into the lungs every 6 (six) hours as needed for wheezing or shortness of breath. 1-2 puffs 15 min prior to exercise., Disp: 8 g, Rfl: 6   cetirizine (ZYRTEC) 10 MG tablet, Take 10 mg by mouth as needed., Disp: , Rfl:    Cyanocobalamin (VITAMIN B 12 PO), Take 1 tablet by  mouth daily., Disp: , Rfl:    DUPIXENT 300 MG/2ML SOAJ, INJECT 300 MG UNDER THE SKIN EVERY 14 DAYS, Disp: 12 mL, Rfl: 1   ELDERBERRY PO, Take 1 each by mouth as needed., Disp: , Rfl:    montelukast (SINGULAIR) 10 MG tablet, TAKE 1 TABLET BY MOUTH AT BEDTIME, Disp: 90 tablet, Rfl: 3   Multiple Vitamin (MULTIVITAMIN WITH MINERALS) TABS tablet, Take 1 tablet by mouth daily., Disp: , Rfl:    SYMBICORT 160-4.5 MCG/ACT inhaler, Inhale 2 puffs into the lungs 2 (two) times daily., Disp: 11 g, Rfl: 5   VITAMIN D PO, Take 1 tablet by mouth daily. Taking 2000 UI daily, Disp: , Rfl:       Objective:   Vitals:   02/05/23 1510  BP: (!) 148/85  Pulse: 86  SpO2: 98%  Weight: 182 lb (82.6 kg)  Height: 5\' 3"  (1.6 m)    Estimated body mass index is 32.24 kg/m as calculated from the following:   Height as of this encounter: 5\' 3"  (1.6 m).   Weight as of this encounter: 182 lb (82.6 kg).  @WEIGHTCHANGE @  Filed Weights   02/05/23 1510  Weight: 182 lb (82.6 kg)  Physical Exam   General: No distress. Looks well O2 at rest: no Cane present: no Sitting in wheel chair: no Frail: no Obese: no Neuro: Alert and Oriented x 3. GCS 15. Speech normal Psych: Pleasant Resp:  Barrel Chest - no.  Wheeze - no, Crackles - no, No overt respiratory distress CVS: Normal heart sounds. Murmurs - no Ext: Stigmata of Connective Tissue Disease - no HEENT: Normal upper airway. PEERL +. No post nasal drip        Assessment:       ICD-10-CM   1. Asthma, moderate persistent, poorly-controlled  J45.40     2. 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 Elevated IgE level  R76.8     4. Need for immunization against influenza  Z23     5. Vaccine counseling  Z71.85          Plan:     Patient Instructions     ICD-10-CM   1. Asthma, moderate persistent, poorly-controlled  J45.40 POCT EXHALED NITRIC OXIDE    2. 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 Elevated IgE level  R76.8        Much improved asthma and well controlled Some mild symptoms during plank exercise that seems to have strted after your hernia surgery   Plan - flu shot 02/05/2023   - STOP SINGULAIR -Continue Symbicort at  80/4.5  2 puffs Twice daily - , brush tongue and rinse mouth afterwards.  - monitor control and weight gain -Continue albuterol inhaler 1-2 puffs every 6 hours as needed for shortness of breath or wheezing.  - Take 1-2 puffs 15 minutes before exercise but can try without - warm up and cool down as before with exercise inclucing climbing stairs -Continue Zyrtec 10 mg daily as needed for allergies/environmental triggers.  --Continue DUPIXENT  Followup  - 9 months or sooner if needed  - ACT test and feno test at followup; can look at reducing dupixent dose if doing well   FOLLOWUP Return in about 9 months (around 11/05/2023) for 15 min visit, Asthma, with Dr Marchelle Gearing, Face to Face Visit.    SIGNATURE    Dr. Kalman Shan, M.D., F.C.C.P,  Pulmonary and Critical Care Medicine Staff Physician, [facility] Director - Interstitial Lung Disease  Program  Pulmonary Fibrosis [facility] at [facility] [place], [place], [address]  Pager: [phone], If no answer or between  15:00h - 7:00h: call [phone] Telephone: [phone]  3:38 PM 02/05/2023

## 2023-02-05 NOTE — Addendum Note (Signed)
Addended by: Glynda Jaeger on: 02/05/2023 04:20 PM   Modules accepted: Orders

## 2023-08-14 ENCOUNTER — Ambulatory Visit (INDEPENDENT_AMBULATORY_CARE_PROVIDER_SITE_OTHER): Admitting: General Practice

## 2023-08-14 ENCOUNTER — Encounter: Payer: Self-pay | Admitting: General Practice

## 2023-08-14 VITALS — BP 114/80 | HR 77 | Temp 98.1°F | Ht 62.0 in | Wt 183.0 lb

## 2023-08-14 DIAGNOSIS — Z23 Encounter for immunization: Secondary | ICD-10-CM

## 2023-08-14 DIAGNOSIS — Z7689 Persons encountering health services in other specified circumstances: Secondary | ICD-10-CM

## 2023-08-14 DIAGNOSIS — R5383 Other fatigue: Secondary | ICD-10-CM | POA: Diagnosis not present

## 2023-08-14 DIAGNOSIS — E559 Vitamin D deficiency, unspecified: Secondary | ICD-10-CM

## 2023-08-14 DIAGNOSIS — Z1159 Encounter for screening for other viral diseases: Secondary | ICD-10-CM

## 2023-08-14 DIAGNOSIS — E7800 Pure hypercholesterolemia, unspecified: Secondary | ICD-10-CM

## 2023-08-14 DIAGNOSIS — R140 Abdominal distension (gaseous): Secondary | ICD-10-CM

## 2023-08-14 DIAGNOSIS — J45909 Unspecified asthma, uncomplicated: Secondary | ICD-10-CM

## 2023-08-14 DIAGNOSIS — E669 Obesity, unspecified: Secondary | ICD-10-CM

## 2023-08-14 LAB — COMPREHENSIVE METABOLIC PANEL WITH GFR
ALT: 11 U/L (ref 0–35)
AST: 11 U/L (ref 0–37)
Albumin: 4.4 g/dL (ref 3.5–5.2)
Alkaline Phosphatase: 48 U/L (ref 39–117)
BUN: 14 mg/dL (ref 6–23)
CO2: 27 meq/L (ref 19–32)
Calcium: 8.8 mg/dL (ref 8.4–10.5)
Chloride: 102 meq/L (ref 96–112)
Creatinine, Ser: 0.68 mg/dL (ref 0.40–1.20)
GFR: 106.49 mL/min (ref >60.00)
Glucose, Bld: 91 mg/dL (ref 70–99)
Potassium: 4.4 meq/L (ref 3.5–5.1)
Sodium: 136 meq/L (ref 135–145)
Total Bilirubin: 0.7 mg/dL (ref 0.2–1.2)
Total Protein: 7 g/dL (ref 6.0–8.3)

## 2023-08-14 LAB — CBC
HCT: 43 % (ref 36.0–46.0)
Hemoglobin: 14.8 g/dL (ref 12.0–15.0)
MCHC: 34.4 g/dL (ref 30.0–36.0)
MCV: 88.1 fl (ref 78.0–100.0)
Platelets: 241 10*3/uL (ref 150.0–400.0)
RBC: 4.88 Mil/uL (ref 3.87–5.11)
RDW: 12.9 % (ref 11.5–15.5)
WBC: 4.5 10*3/uL (ref 4.0–10.5)

## 2023-08-14 LAB — LIPID PANEL
Cholesterol: 201 mg/dL — ABNORMAL HIGH (ref 0–200)
HDL: 68.9 mg/dL (ref >39.00)
LDL Cholesterol: 117 mg/dL — ABNORMAL HIGH (ref 0–99)
NonHDL: 131.69
Total CHOL/HDL Ratio: 3
Triglycerides: 71 mg/dL (ref 0.0–149.0)
VLDL: 14.2 mg/dL (ref 0.0–40.0)

## 2023-08-14 LAB — VITAMIN B12: Vitamin B-12: 521 pg/mL (ref 211–911)

## 2023-08-14 LAB — VITAMIN D 25 HYDROXY (VIT D DEFICIENCY, FRACTURES): VITD: 36.15 ng/mL (ref 30.00–100.00)

## 2023-08-14 LAB — FOLLICLE STIMULATING HORMONE: FSH: 3.6 m[IU]/mL

## 2023-08-14 LAB — LUTEINIZING HORMONE: LH: 7.68 m[IU]/mL

## 2023-08-14 LAB — TSH: TSH: 1.44 u[IU]/mL (ref 0.35–5.50)

## 2023-08-14 LAB — HEMOGLOBIN A1C: Hgb A1c MFr Bld: 5.2 % (ref 4.6–6.5)

## 2023-08-14 NOTE — Assessment & Plan Note (Signed)
 Discussed the importance of healthy diet and exercise to affect sustainable weight loss.

## 2023-08-14 NOTE — Assessment & Plan Note (Signed)
 Ongoing.  Reviewed labs and notes at length with patient.   Will check labs to rule out metabolic causes. She would like ot her hormones checked as well.  Cbc, CMP, TSH, vitamin d , vitamin b 12, FSH, LH pending.

## 2023-08-14 NOTE — Assessment & Plan Note (Addendum)
 Controlled.   Followed by pulmonology.  Currently managed on Dupixent , Zyrtec, symbicort  as per pulmonology.  Reviewed notes from November, 2024. Given Prevnar 20 today.

## 2023-08-14 NOTE — Assessment & Plan Note (Signed)
 EMR reviewed briefly.

## 2023-08-14 NOTE — Assessment & Plan Note (Signed)
 Reviewed labs from April, 2024.  Repeat Lipid panel pending.

## 2023-08-14 NOTE — Progress Notes (Signed)
 New Patient Office Visit  Subjective    Patient ID: Nicole Owens, female    DOB: Dec 06, 1980  Age: 43 y.o. MRN: [id number]  CC:  Chief Complaint  Patient presents with   Transitions Of Care    From Dr. Aletha Hutching.   Fatigue    Very exhausted, weight gain, bursing easily x 1-2 years. Patient had a lot of blood work done last year with Margarie Shay and wants to see if more needs to be done or repeated as sx are getting worse.     HPI Nicole Owens is a 43 y.o. female presents to establish care from Dr. Aletha Hutching and Winthrop Hawks, NP.   Fatigue: symptom onset 1-2 years ago. Not daily. Some days she has a hard a keep your eyes open. Usually goes to bed around 11 pm and wake up at 6 am. She does fall asleep right away in 10 mins and does not wake up in the middle of the night. She does not feel dizziness or lightheaded during the day. She can usually get her work done but when she sits down she would fall asleep. She has not fallen asleep while driving. She has not been told that she snores but does talk in her sleep.    Outpatient Encounter Medications as of 08/14/2023  Medication Sig   albuterol  (PROAIR  HFA) 108 (90 Base) MCG/ACT inhaler Inhale 1-2 puffs into the lungs every 6 (six) hours as needed for wheezing or shortness of breath. 1-2 puffs 15 min prior to exercise.   cetirizine (ZYRTEC) 10 MG tablet Take 10 mg by mouth as needed.   Cyanocobalamin  (VITAMIN B 12 PO) Take 1 tablet by mouth daily.   DUPIXENT  300 MG/2ML SOAJ INJECT 300 MG UNDER THE SKIN EVERY 14 DAYS   ELDERBERRY PO Take 1 each by mouth as needed.   Multiple Vitamin (MULTIVITAMIN WITH MINERALS) TABS tablet Take 1 tablet by mouth daily.   SYMBICORT  160-4.5 MCG/ACT inhaler Inhale 2 puffs into the lungs 2 (two) times daily.   VITAMIN D  PO Take 1 tablet by mouth daily. Taking 2000 UI daily   [DISCONTINUED] montelukast  (SINGULAIR ) 10 MG tablet TAKE 1 TABLET BY MOUTH AT BEDTIME   No facility-administered encounter medications on file  as of 08/14/2023.    Past Medical History:  Diagnosis Date   Asthma    Asthma, intermittent    mild   Carpal tunnel syndrome on both sides    with pregnancy   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   Female infertility 04/30/2010   history restored after error with record merge   FIBROCYSTIC BREAST DISEASE 07/17/2008   Qualifier: Diagnosis of  By: Cherlyn Cornet MD, Amy     Heartburn in pregnancy    nausea   Multiple gestation - triplets 08/10/2011   Postpartum care following cesarean delivery 02/17/2012   Rh negative, maternal 02/17/2012   S/P cesarean section - triplets 11/29 02/17/2012    Past Surgical History:  Procedure Laterality Date   CESAREAN SECTION  02/16/2012   Procedure: CESAREAN SECTION;  Surgeon: Camillo Celestine, MD;  Location: [facility];  Service: Obstetrics;  Laterality: N/A;   HERNIA REPAIR  06/17/2020   hernia repair with mesh and tummy tuck   HIP ARTHROSCOPY Right 08/03/2020   Procedure: Rt hip arthroscopic labrum repair;  Surgeon: Janeth Medicus, MD;  Location: [facility] OR;  Service: Orthopedics;  Laterality: Right;   INGUINAL HERNIA REPAIR     WISDOM TOOTH EXTRACTION  Family History  Problem Relation Age of Onset   Hypertension Father    Diabetes Father    Obesity Sister    Heart attack Maternal Grandfather    Stroke Paternal Grandfather     Social History   Socioeconomic History   Marital status: Married    Spouse name: Nicole Owens   Number of children: 3   Years of education: college   Highest education level: Not on file  Occupational History   Occupation: Personal assistant: [employer]  Tobacco Use   Smoking status: Never   Smokeless tobacco: Never  Vaping Use   Vaping status: Never Used  Substance and Sexual Activity   Alcohol use: Yes    Comment: 1-2 times a week, 1 glass   Drug use: No   Sexual activity: Not Currently  Other Topics Concern   Not on file  Social History Narrative   12/02/20   From: the area    Living: with husband Ron (2010) and 3 children   Work: Automotive engineer for [employer]      Family: 3 children - [place], Debria Fang, Andy Bannister (2013)      Enjoys: work-out, running, bake, crafts      Exercise: 5 days a week   Diet: not bad, does snack at night      Safety   Seat belts: Yes    Guns: No   Safe in relationships: Yes       Social Drivers of Corporate investment banker Strain: Not on file  Food Insecurity: Not on file  Transportation Needs: Not on file  Physical Activity: Not on file  Stress: Not on file  Social Connections: Not on file  Intimate Partner Violence: Not on file    Review of Systems  Constitutional:  Positive for malaise/fatigue. Negative for chills and fever.  Respiratory:  Negative for shortness of breath.   Cardiovascular:  Negative for chest pain.  Gastrointestinal:  Negative for abdominal pain, constipation, diarrhea, heartburn, nausea and vomiting.  Genitourinary:  Negative for dysuria, frequency and urgency.  Neurological:  Negative for dizziness and headaches.  Endo/Heme/Allergies:  Negative for polydipsia.  Psychiatric/Behavioral:  Negative for depression and suicidal ideas. The patient is not nervous/anxious.         Objective    BP 114/80 (BP Location: Left Arm, Patient Position: Sitting, Cuff Size: Normal)   Pulse 77   Temp 98.1 F (36.7 C) (Oral)   Ht 5\' 2"  (1.575 m)   Wt 183 lb (83 kg)   LMP 07/29/2023 (Exact Date)   SpO2 99%   BMI 33.47 kg/m   Physical Exam Vitals and nursing note reviewed.  Constitutional:      Appearance: Normal appearance.  Cardiovascular:     Rate and Rhythm: Normal rate and regular rhythm.     Pulses: Normal pulses.     Heart sounds: Normal heart sounds.  Pulmonary:     Effort: Pulmonary effort is normal.     Breath sounds: Normal breath sounds.  Neurological:     Mental Status: She is alert and oriented to person, place, and time.  Psychiatric:        Mood and Affect: Mood normal.         Behavior: Behavior normal.        Thought Content: Thought content normal.        Judgment: Judgment normal.         Assessment & Plan:  Other fatigue Assessment & Plan: Ongoing.  Reviewed labs and notes at length with patient.   Will check labs to rule out metabolic causes. She would like ot her hormones checked as well.  Cbc, CMP, TSH, vitamin d , vitamin b 12, FSH, LH pending.  Orders: -     CBC -     Comprehensive metabolic panel with GFR -     TSH -     Vitamin B12 -     Hemoglobin A1c -     Follicle stimulating hormone -     Luteinizing hormone  Establishing care with new doctor, encounter for Assessment & Plan: EMR reviewed briefly.     Vitamin D  deficiency Assessment & Plan: Repeat Vitamin d  level pending.  Orders: -     VITAMIN D  25 Hydroxy (Vit-D Deficiency, Fractures) -     Vitamin B12  Hypercholesteremia Assessment & Plan: Reviewed labs from April, 2024.  Repeat Lipid panel pending.  Orders: -     TSH -     Vitamin B12 -     Lipid panel -     Hemoglobin A1c  Need for hepatitis C screening test -     Hepatitis C antibody  Need for pneumococcal 20-valent conjugate vaccination -     Pneumococcal conjugate vaccine 20-valent  Intrinsic asthma without complication, unspecified asthma severity, unspecified whether persistent Assessment & Plan: Controlled.   Followed by pulmonology.  Currently managed on Dupixent , Zyrtec, symbicort  as per pulmonology.  Reviewed notes from November, 2024. Given Prevnar 20 today.    Bloating Assessment & Plan: Chronic.  Saw GI in January, 2024.  Discussed to follow up with GI for intermittent symptoms.    Obesity (BMI 30-39.9) Assessment & Plan: Discussed the importance of healthy diet and exercise to affect sustainable weight loss.       Return in about 4 weeks (around 09/11/2023) for physical.   Jolanda Nation, NP

## 2023-08-14 NOTE — Patient Instructions (Addendum)
 Stop by the lab prior to leaving today. I will notify you of your results once received.   Call and discuss symptoms with GI.   Follow up in 4 weeks for fatigue.   It was a pleasure meeting you!

## 2023-08-14 NOTE — Assessment & Plan Note (Signed)
 Repeat Vitamin d level pending.

## 2023-08-14 NOTE — Assessment & Plan Note (Signed)
 Chronic.  Saw GI in January, 2024.  Discussed to follow up with GI for intermittent symptoms.

## 2023-08-15 ENCOUNTER — Ambulatory Visit: Payer: Self-pay | Admitting: General Practice

## 2023-08-15 LAB — HEPATITIS C ANTIBODY: Hepatitis C Ab: NONREACTIVE

## 2023-08-21 IMAGING — MG MM DIGITAL SCREENING BILAT W/ TOMO AND CAD
8 series · 8 of 24 positions shown · non-contrast
Comparison: None.

CLINICAL DATA: Screening.

EXAM:
DIGITAL SCREENING BILATERAL MAMMOGRAM WITH TOMOSYNTHESIS AND CAD
TECHNIQUE: Bilateral screening digital craniocaudal and mediolateral oblique
mammograms were obtained. Bilateral screening digital breast
tomosynthesis was performed. The images were evaluated with
computer-aided detection.

[L MLO synth-2D]
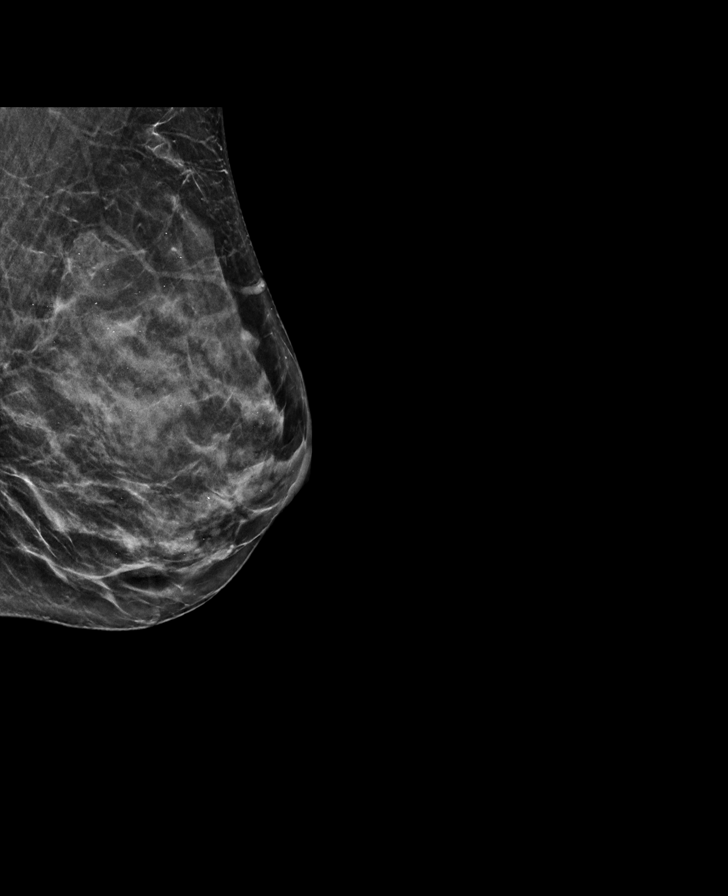

[L CC synth-2D]
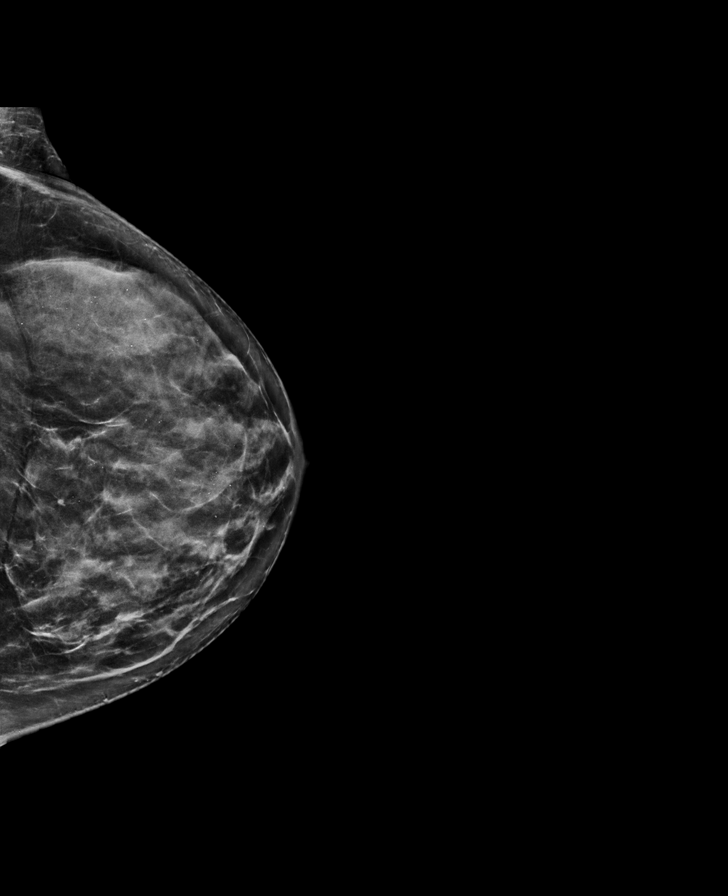

[R MLO synth-2D]
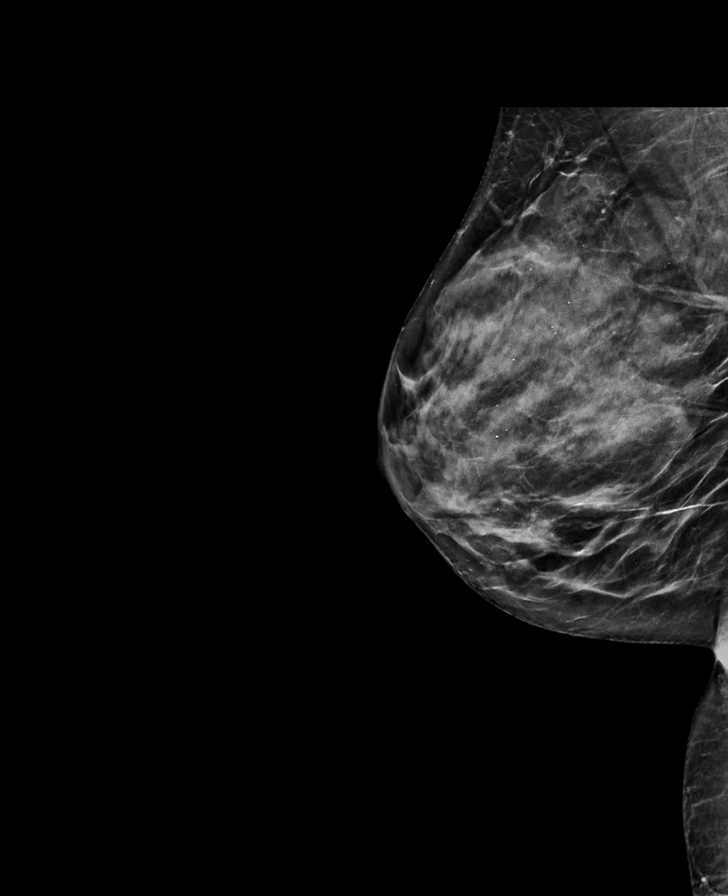

[R CC synth-2D]
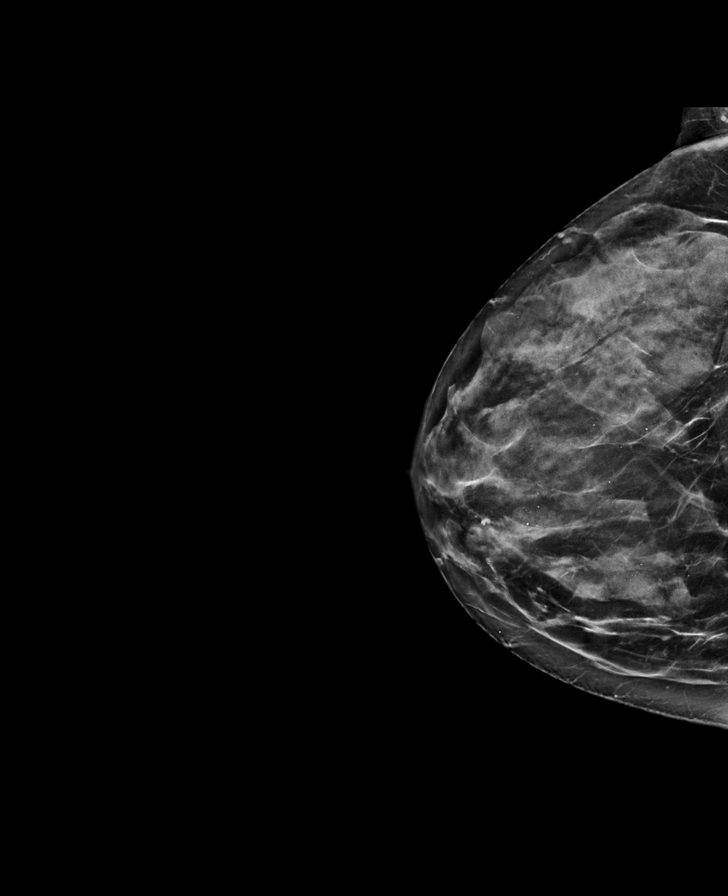

[R CC tomo · tomo slice 36/71.0]
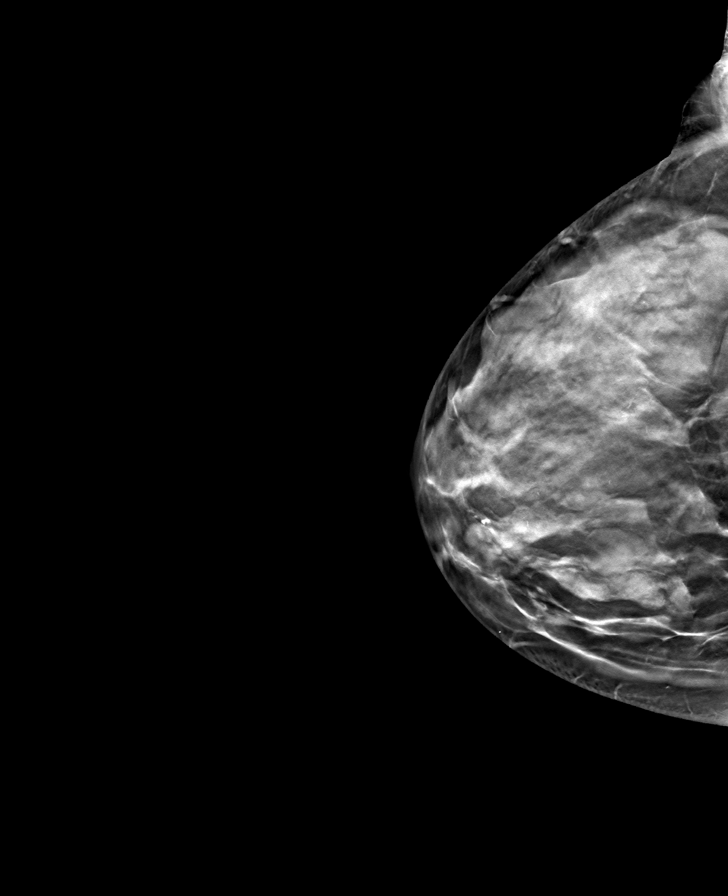

[L MLO tomo · tomo slice 31/60.0]
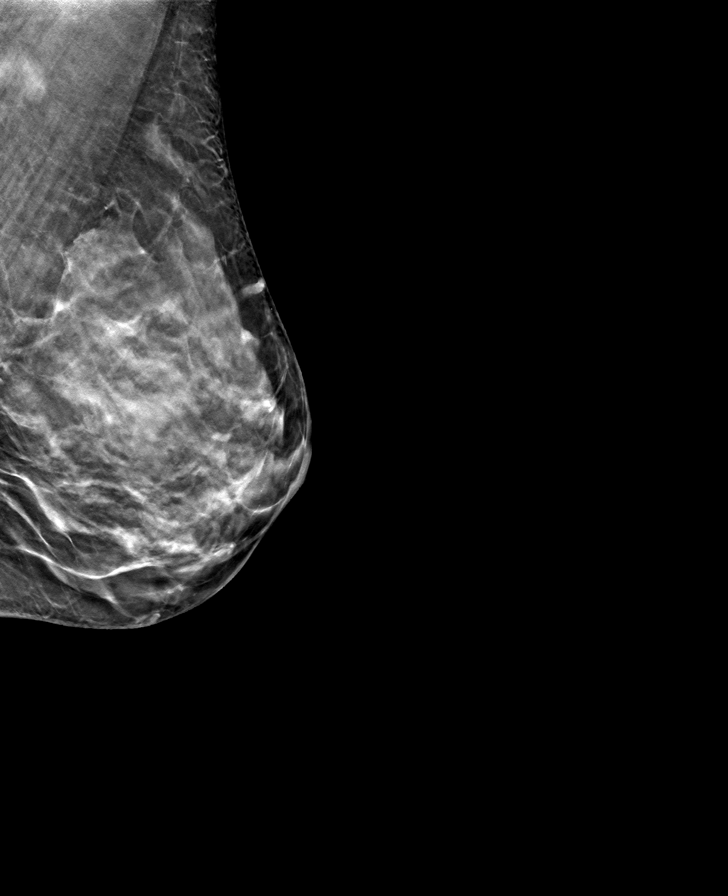

[R MLO tomo · tomo slice 34/67.0]
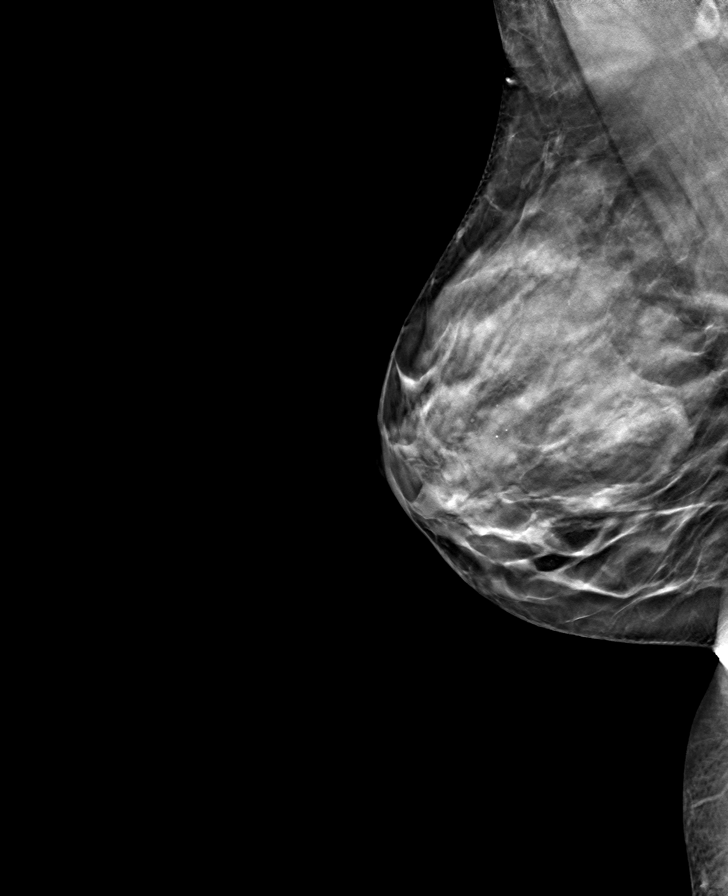

[L CC tomo · tomo slice 33/66.0]
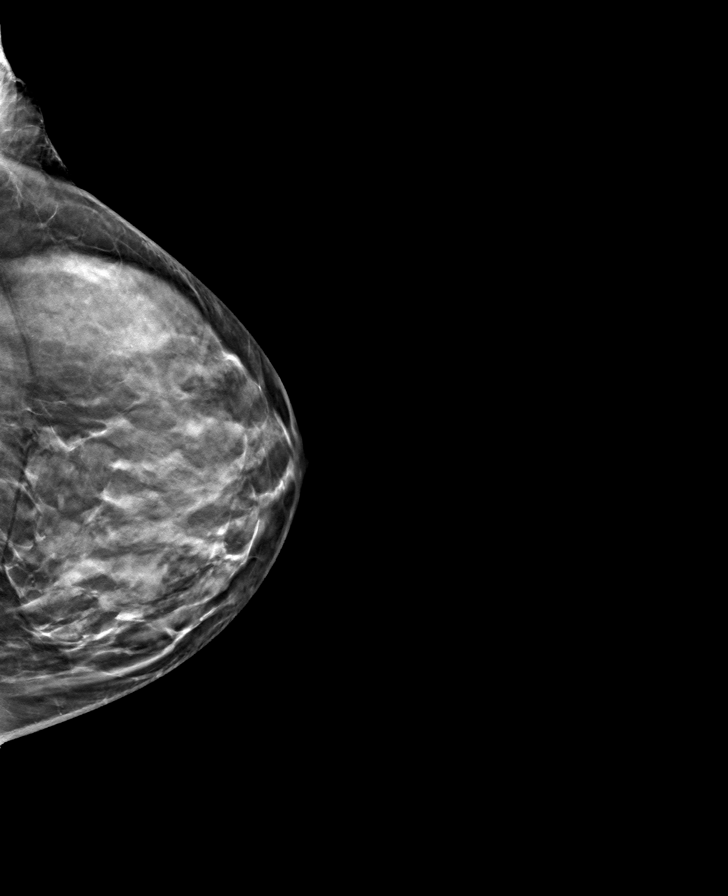

[8 of 24 positions shown; findings below may reference images not displayed]

ACR Breast Density Category c: The breast tissue is heterogeneously
dense, which may obscure small masses
FINDINGS: There are no findings suspicious for malignancy.
IMPRESSION: No mammographic evidence of malignancy. A result letter of this
screening mammogram will be mailed directly to the patient.

RECOMMENDATION:
Screening mammogram in one year. (Code:C8-T-HNK)

BI-RADS CATEGORY  1: Negative.

## 2023-08-24 ENCOUNTER — Telehealth: Payer: Self-pay | Admitting: Internal Medicine

## 2023-08-24 DIAGNOSIS — J4540 Moderate persistent asthma, uncomplicated: Secondary | ICD-10-CM

## 2023-08-24 NOTE — Telephone Encounter (Signed)
 Refill sent for DUPIXENT  to [facility]: [phone]  Dose: 300mg  subcut every 14 days  Last OV: 02/05/2023 Provider: Dr. Bertrum Brodie  Next OV: due in Aug 2025  Routing to scheduling team for follow-up on appt scheduling  Geraldene Kleine, PharmD, MPH, BCPS Clinical Pharmacist (Rheumatology and Pulmonology)

## 2023-08-25 ENCOUNTER — Ambulatory Visit
Admission: EM | Admit: 2023-08-25 | Discharge: 2023-08-25 | Disposition: A | Discharge status: Home / Self Care | Attending: Emergency Medicine | Admitting: Emergency Medicine

## 2023-08-25 DIAGNOSIS — J069 Acute upper respiratory infection, unspecified: Secondary | ICD-10-CM | POA: Diagnosis not present

## 2023-08-25 LAB — POC COVID19/FLU A&B COMBO
Covid Antigen, POC: NEGATIVE
Influenza A Antigen, POC: NEGATIVE
Influenza B Antigen, POC: NEGATIVE

## 2023-08-25 LAB — POCT RAPID STREP A (OFFICE): Rapid Strep A Screen: NEGATIVE

## 2023-08-25 MED ORDER — PREDNISONE 10 MG (21) PO TBPK: ORAL_TABLET | Freq: Every day | ORAL | 0 refills | Status: DC

## 2023-08-25 NOTE — ED Triage Notes (Signed)
 Patient presents to UC for sore throat and congestion x 2 days. Concerned with flu/covid and strep.

## 2023-08-25 NOTE — ED Provider Notes (Signed)
 Nicole Owens    CSN: [id number] Arrival date & time: 08/25/23  0820      History   Chief Complaint Chief Complaint  Patient presents with   Sore Throat    HPI Nicole Owens is a 43 y.o. female.   Patient presents for evaluation of nasal congestion, sinus pressure to the bilateral ears in the cheeks, bilateral ear fullness and a sore throat present for 2 days.  Symptoms worsening this morning.  Decreased appetite but able to tolerate food and liquids.  Has attempted use of sinus medication, Tylenol  and ibuprofen .  No known sick contacts prior.  Denies fever.  Past Medical History:  Diagnosis Date   Asthma    Asthma, intermittent    mild   Carpal tunnel syndrome on both sides    with pregnancy   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   Female infertility 04/30/2010   history restored after error with record merge   FIBROCYSTIC BREAST DISEASE 07/17/2008   Qualifier: Diagnosis of  By: Cherlyn Cornet MD, Amy     Heartburn in pregnancy    nausea   Multiple gestation - triplets 08/10/2011   Postpartum care following cesarean delivery 02/17/2012   Rh negative, maternal 02/17/2012   S/P cesarean section - triplets 11/29 02/17/2012    Patient Active Problem List   Diagnosis Date Noted   Establishing care with new doctor, encounter for 08/14/2023   Vitamin D  deficiency 06/23/2022   Obesity (BMI 30-39.9) 06/23/2022   Hypercholesteremia 06/23/2022   Other fatigue 10/11/2021   Bloating 07/11/2021   Preventative health care 06/15/2020   Asthma, 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    Past Surgical History:  Procedure Laterality Date   CESAREAN SECTION  02/16/2012   Procedure: CESAREAN SECTION;  Surgeon: Camillo Celestine, MD;  Location: [facility];  Service: Obstetrics;  Laterality: N/A;   HERNIA REPAIR  06/17/2020   hernia repair with mesh and tummy tuck   HIP ARTHROSCOPY Right 08/03/2020   Procedure: Rt hip arthroscopic labrum repair;  Surgeon: Janeth Medicus, MD;  Location: [facility] OR;   Service: Orthopedics;  Laterality: Right;   INGUINAL HERNIA REPAIR     WISDOM TOOTH EXTRACTION      OB History     Gravida  1   Para  1   Term  0   Preterm  1   AB  0   Living  3      SAB  0   IAB  0   Ectopic  0   Multiple  1   Live Births  3            Home Medications    Prior to Admission medications   Medication Sig Start Date End Date Taking? Authorizing Provider  predniSONE  (STERAPRED UNI-PAK 21 TAB) 10 MG (21) TBPK tablet Take by mouth daily. Take 6 tabs by mouth daily  for 1 days, then 5 tabs for 1 days, then 4 tabs for 1 days, then 3 tabs for 1 days, 2 tabs for 1 days, then 1 tab by mouth daily for 1 days 08/25/23  Yes Flo Berroa R, NP  albuterol  (PROAIR  HFA) 108 (90 Base) MCG/ACT inhaler Inhale 1-2 puffs into the lungs every 6 (six) hours as needed for wheezing or shortness of breath. 1-2 puffs 15 min prior to exercise. 12/15/21   Maire Scot, MD  cetirizine (ZYRTEC) 10 MG tablet Take 10 mg by mouth as needed.    [provider]  Cyanocobalamin  (VITAMIN B 12  PO) Take 1 tablet by mouth daily.    [provider]  DUPIXENT  300 MG/2ML SOAJ INJECT 300 MG UNDER THE SKIN EVERY 14 DAYS 08/24/23   Maire Scot, MD  ELDERBERRY PO Take 1 each by mouth as needed.    [provider]  Multiple Vitamin (MULTIVITAMIN WITH MINERALS) TABS tablet Take 1 tablet by mouth daily.    [provider]  SYMBICORT  160-4.5 MCG/ACT inhaler Inhale 2 puffs into the lungs 2 (two) times daily. 12/15/21   Maire Scot, MD  VITAMIN D  PO Take 1 tablet by mouth daily. Taking 2000 UI daily    [provider]    Family History Family History  Problem Relation Age of Onset   Hypertension Father    Diabetes Father    Obesity Sister    Heart attack Maternal Grandfather    Stroke Paternal Grandfather     Social History Social History   Tobacco Use   Smoking status: Never   Smokeless tobacco: Never  Vaping Use   Vaping  status: Never Used  Substance Use Topics   Alcohol use: Yes    Comment: 1-2 times a week, 1 glass   Drug use: No     Allergies   Aspirin   Review of Systems Review of Systems   Physical Exam Triage Vital Signs ED Triage Vitals  Encounter Vitals Group     BP 08/25/23 0841 139/78     Systolic BP Percentile --      Diastolic BP Percentile --      Pulse Rate 08/25/23 0841 74     Resp 08/25/23 0841 16     Temp 08/25/23 0841 97.7 F (36.5 C)     Temp Source 08/25/23 0841 Temporal     SpO2 08/25/23 0841 98 %     Weight --      Height --      Head Circumference --      Peak Flow --      Pain Score 08/25/23 0919 0     Pain Loc --      Pain Education --      Exclude from Growth Chart --    No data found.  Updated Vital Signs BP 139/78 (BP Location: Right Arm)   Pulse 74   Temp 97.7 F (36.5 C) (Temporal)   Resp 16   LMP 07/29/2023 (Exact Date)   SpO2 98%   Visual Acuity Right Eye Distance:   Left Eye Distance:   Bilateral Distance:    Right Eye Near:   Left Eye Near:    Bilateral Near:     Physical Exam Constitutional:      Appearance: Normal appearance.  HENT:     Head: Normocephalic.     Right Ear: Tympanic membrane, ear canal and external ear normal.     Left Ear: Tympanic membrane, ear canal and external ear normal.     Nose: Congestion present.     Mouth/Throat:     Mouth: Mucous membranes are moist.     Pharynx: Oropharynx is clear. Posterior oropharyngeal erythema present. No oropharyngeal exudate.  Eyes:     Extraocular Movements: Extraocular movements intact.  Cardiovascular:     Rate and Rhythm: Normal rate and regular rhythm.     Pulses: Normal pulses.     Heart sounds: Normal heart sounds.  Pulmonary:     Effort: Pulmonary effort is normal.     Breath sounds: Normal breath sounds.  Musculoskeletal:  Cervical back: Normal range of motion and neck supple.  Neurological:     Mental Status: She is alert and oriented to person, place,  and time. Mental status is at baseline.      UC Treatments / Results  Labs (all labs ordered are listed, but only abnormal results are displayed) Labs Reviewed  POC COVID19/FLU A&B COMBO - Normal  POCT RAPID STREP A (OFFICE) - Normal    EKG   Radiology No results found.  Procedures Procedures (including critical care time)  Medications Ordered in UC Medications - No data to display  Initial Impression / Assessment and Plan / UC Course  I have reviewed the triage vital signs and the nursing notes.  Pertinent labs & imaging results that were available during my care of the patient were reviewed by me and considered in my medical decision making (see chart for details).  Viral URI with cough  Patient is in no signs of distress nor toxic appearing.  Vital signs are stable.  Low suspicion for pneumonia, pneumothorax or bronchitis and therefore will defer imaging.  COVID flu and strep test negative.  Prescribed prednisone .May use additional over-the-counter medications as needed for supportive care.  May follow-up with urgent care as needed if symptoms persist or worsen.   Final Clinical Impressions(s) / UC Diagnoses   Final diagnoses:  Viral URI with cough   Discharge Instructions      Your symptoms today are most likely being caused by a virus and should steadily improve in time it can take up to 7 to 10 days before you truly start to see a turnaround however things will get better  COVID and flu test are negative, strep test is negative  You may take prednisone  every morning with food as directed to help with throat pain as well as sinus pressure    You can take Tylenol   as needed for fever reduction and pain relief.   For cough: honey 1/2 to 1 teaspoon (you can dilute the honey in water or another fluid).  You can also use guaifenesin and dextromethorphan for cough. You can use a humidifier for chest congestion and cough.  If you don't have a humidifier, you can sit  in the bathroom with the hot shower running.      For sore throat: try warm salt water gargles, cepacol lozenges, throat spray, warm tea or water with lemon/honey, popsicles or ice, or OTC cold relief medicine for throat discomfort.   For congestion: take a daily anti-histamine like Zyrtec, Claritin, and a oral decongestant, such as pseudoephedrine.  You can also use Flonase 1-2 sprays in each nostril daily.   It is important to stay hydrated: drink plenty of fluids (water, gatorade/powerade/pedialyte, juices, or teas) to keep your throat moisturized and help further relieve irritation/discomfort.   ED Prescriptions     Medication Sig Dispense Auth. Provider   predniSONE  (STERAPRED UNI-PAK 21 TAB) 10 MG (21) TBPK tablet Take by mouth daily. Take 6 tabs by mouth daily  for 1 days, then 5 tabs for 1 days, then 4 tabs for 1 days, then 3 tabs for 1 days, 2 tabs for 1 days, then 1 tab by mouth daily for 1 days 21 tablet Tilly Pernice, Maybelle Spatz, NP      PDMP not reviewed this encounter.   Reena Canning, [place] 08/25/23 [phone]

## 2023-08-25 NOTE — Discharge Instructions (Addendum)
 Your symptoms today are most likely being caused by a virus and should steadily improve in time it can take up to 7 to 10 days before you truly start to see a turnaround however things will get better  COVID and flu test are negative, strep test is negative  You may take prednisone  every morning with food as directed to help with throat pain as well as sinus pressure    You can take Tylenol   as needed for fever reduction and pain relief.   For cough: honey 1/2 to 1 teaspoon (you can dilute the honey in water or another fluid).  You can also use guaifenesin and dextromethorphan for cough. You can use a humidifier for chest congestion and cough.  If you don't have a humidifier, you can sit in the bathroom with the hot shower running.      For sore throat: try warm salt water gargles, cepacol lozenges, throat spray, warm tea or water with lemon/honey, popsicles or ice, or OTC cold relief medicine for throat discomfort.   For congestion: take a daily anti-histamine like Zyrtec, Claritin, and a oral decongestant, such as pseudoephedrine.  You can also use Flonase 1-2 sprays in each nostril daily.   It is important to stay hydrated: drink plenty of fluids (water, gatorade/powerade/pedialyte, juices, or teas) to keep your throat moisturized and help further relieve irritation/discomfort.

## 2023-10-15 ENCOUNTER — Telehealth: Payer: Self-pay | Admitting: Internal Medicine

## 2023-10-15 DIAGNOSIS — J4540 Moderate persistent asthma, uncomplicated: Secondary | ICD-10-CM

## 2023-10-15 NOTE — Telephone Encounter (Signed)
 Refill sent for DUPIXENT  to [facility]: [phone]  Dose: 300mg  subcut every 14 days  Last OV: 02/05/2023 Provider: Dr. Geronimo  Next OV: overdue  Scheduling team messaged  Sherry Pennant, PharmD, MPH, BCPS Clinical Pharmacist (Rheumatology and Pulmonology)

## 2023-10-16 NOTE — Telephone Encounter (Signed)
 APT for Rx refill has been made.

## 2023-12-04 ENCOUNTER — Ambulatory Visit: Admitting: Nurse Practitioner

## 2023-12-10 ENCOUNTER — Other Ambulatory Visit: Payer: Self-pay | Admitting: Internal Medicine

## 2023-12-10 DIAGNOSIS — J4540 Moderate persistent asthma, uncomplicated: Secondary | ICD-10-CM

## 2023-12-10 NOTE — Telephone Encounter (Signed)
 Pt requesting refill of specialty medication. Routing to Rx team.

## 2023-12-11 NOTE — Telephone Encounter (Signed)
 Refill sent for DUPIXENT  to [facility]: [phone]  Dose: 300mg  [place] every 14 days  Last OV: 02/05/2023 Provider: Dr. Geronimo  Next OV: overdue (due Aug 2025), scheduled 01/30/24 with Izetta Rouleau, NP   Aleck Puls, PharmD, BCPS Clinical Pharmacist  [facility]

## 2023-12-17 ENCOUNTER — Ambulatory Visit: Payer: Self-pay

## 2023-12-17 NOTE — Telephone Encounter (Signed)
 Noted

## 2023-12-17 NOTE — Telephone Encounter (Signed)
 FYI Only or Action Required?: FYI only for provider.  Patient was last seen in primary care on 08/14/2023 by Vincente Shivers, NP.  Called Nurse Triage reporting Breast Pain.  Symptoms began several days ago.  Interventions attempted: Nothing.  Symptoms are: unchanged.  Triage Disposition: See Physician Within 24 Hours  Patient/caregiver understands and will follow disposition?: Yes Copied from CRM #[id number]. Topic: Clinical - Red Word Triage >> Dec 17, 2023  8:58 AM Timindy P wrote: Red Word that prompted transfer to Nurse Triage: Pain- R. Breast pain and body aches. Breast is hardened since Saturday. Taking ibuprofen  so unsure of fever.    ----------------------------------------------------------------------- From previous Reason for Contact - Scheduling: Patient/patient representative is calling to schedule an appointment. Refer to attachments for appointment information. Reason for Disposition  [1] Breast looks infected (spreading redness, feels hot or painful to touch) AND [2] no fever  Answer Assessment - Initial Assessment Questions 1. SYMPTOM: What's the main symptom you're concerned about?  (e.g., lump, nipple discharge, pain, rash)     Right breast pain and body aches, states feels hardened. 2. LOCATION: Where is the symptoms located?     Right breast 3. ONSET: When did pain  start?     Saturday, states can feel a lump size of her fist 4. PRIOR HISTORY: Do you have any history of prior problems with your breasts? (e.g., breast cancer, breast implant, fibrocystic breast disease)     Mastitis, but children are 12 now. 5. CAUSE: What do you think is causing this symptom?     mastitis 6. OTHER SYMPTOMS: Do you have any other symptoms? (e.g., breast pain, fever, nipple discharge, redness or rash)     Moderate breast pain 7. PREGNANCY-BREASTFEEDING: Is there any chance you are pregnant? When was your last menstrual period? Are you breastfeeding?      no  Protocols used: Breast Symptoms-A-AH

## 2023-12-18 ENCOUNTER — Other Ambulatory Visit: Payer: Self-pay | Admitting: General Practice

## 2023-12-18 ENCOUNTER — Ambulatory Visit: Admitting: General Practice

## 2023-12-18 ENCOUNTER — Ambulatory Visit
Admission: RE | Admit: 2023-12-18 | Discharge: 2023-12-18 | Disposition: A | Discharge status: Home / Self Care | Source: Ambulatory Visit | Attending: General Practice | Admitting: General Practice

## 2023-12-18 ENCOUNTER — Telehealth: Payer: Self-pay | Admitting: General Practice

## 2023-12-18 ENCOUNTER — Encounter: Payer: Self-pay | Admitting: General Practice

## 2023-12-18 VITALS — BP 118/84 | HR 73 | Temp 98.1°F | Ht 62.0 in | Wt 188.0 lb

## 2023-12-18 DIAGNOSIS — N644 Mastodynia: Secondary | ICD-10-CM

## 2023-12-18 DIAGNOSIS — N6310 Unspecified lump in the right breast, unspecified quadrant: Secondary | ICD-10-CM

## 2023-12-18 NOTE — Patient Instructions (Addendum)
 Call and schedule mammogram.   They will let me know if you need the ultrasound.   Please schedule physical at your convenience.   Update me if your symptoms worsen or do not improve.   It was a pleasure to see you today!

## 2023-12-18 NOTE — Progress Notes (Signed)
 Established Patient Office Visit  Subjective   Patient ID: Nicole Owens, female    DOB: 1980/08/30  Age: 43 y.o. MRN: [id number]  Chief Complaint  Patient presents with   Breast Pain    Since Saturday on right side. Patient states they are stabbing pains off and on. When it first happened there was a rock hard knot; knot has loosened some but still present. Patient has been taking ibuprofen  as needed for the pain. Patient has also been achy and run down feeling.     HPI  Nicole Owens is a 43 year old female with past medical history of asthma, fatigue, vitamin d  deficiency, obesity, HLD presents today for an acute visit.   Discussed the use of AI scribe software for clinical note transcription with the patient, who gave verbal consent to proceed.  History of Present Illness Nicole Owens is a 43 year old female who presents with right breast pain and a palpable mass.  She has been experiencing right breast pain since Saturday, accompanied by a palpable mass in the middle of the right breast. The mass was initially very hard but has since loosened somewhat. She reports that the mass is much larger than before, to the point that she was spilling out of her bra.  She has a history of breast cysts, with the largest previously located in the outer quadrant of the right breast. She underwent a mammogram screening in April of last year, followed by a diagnostic mammogram in July due to the presence of cysts.  She has attempted various home remedies including hot showers, ibuprofen , and using a heating pad, which she slept on the first night.  No fever, chills, nipple discharge, redness, or changes in the areola. She denies chest pain, shortness of breath, difficulty breathing, urinary symptoms, and abnormal vaginal bleeding.    Patient Active Problem List   Diagnosis Date Noted   Establishing care with new doctor, encounter for 08/14/2023   Vitamin D  deficiency 06/23/2022   Obesity  (BMI 30-39.9) 06/23/2022   Hypercholesteremia 06/23/2022   Other fatigue 10/11/2021   Bloating 07/11/2021   Preventative health care 06/15/2020   Asthma, 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   Past Medical History:  Diagnosis Date   Asthma    Asthma, intermittent    mild   Carpal tunnel syndrome on both sides    with pregnancy   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   Female infertility 04/30/2010   history restored after error with record merge   FIBROCYSTIC BREAST DISEASE 07/17/2008   Qualifier: Diagnosis of  By: Avelina MD, Amy     Heartburn in pregnancy    nausea   Multiple gestation - triplets 08/10/2011   Postpartum care following cesarean delivery 02/17/2012   Rh negative, maternal 02/17/2012   S/P cesarean section - triplets 11/29 02/17/2012   Past Surgical History:  Procedure Laterality Date   CESAREAN SECTION  02/16/2012   Procedure: CESAREAN SECTION;  Surgeon: Charlie JINNY Flowers, MD;  Location: [facility];  Service: Obstetrics;  Laterality: N/A;   HERNIA REPAIR  06/17/2020   hernia repair with mesh and tummy tuck   HIP ARTHROSCOPY Right 08/03/2020   Procedure: Rt hip arthroscopic labrum repair;  Surgeon: Sharl Selinda Dover, MD;  Location: [facility] OR;  Service: Orthopedics;  Laterality: Right;   INGUINAL HERNIA REPAIR     WISDOM TOOTH EXTRACTION     Allergies  Allergen Reactions   Aspirin Shortness Of Breath and Other (See Comments)  Can trigger asthma, told nurse ibuprofen  makes her feel odd and only takes one 200mg  tablet if needed         12/18/2023    8:25 AM 08/14/2023    8:16 AM 06/23/2022   12:29 PM  Depression screen PHQ 2/9  Decreased Interest 1 0 0  Down, Depressed, Hopeless 1 0 0  PHQ - 2 Score 2 0 0  Altered sleeping 1 0 2  Tired, decreased energy 1 2 2   Change in appetite 0 1 2  Feeling bad or failure about yourself  0 0 0  Trouble concentrating 0 1 0  Moving slowly or fidgety/restless 0 0 0  Suicidal thoughts 0 0 0  PHQ-9 Score 4 4 6   Difficult doing  work/chores Not difficult at all Not difficult at all Not difficult at all       12/18/2023    8:25 AM 08/14/2023    8:16 AM 06/23/2022   12:29 PM  GAD 7 : Generalized Anxiety Score  Nervous, Anxious, on Edge 0 0 1  Control/stop worrying 0 0 0  Worry too much - different things 0 0 1  Trouble relaxing 0 0 0  Restless 0 0 0  Easily annoyed or irritable 2 1 1   Afraid - awful might happen 0 0 1  Total GAD 7 Score 2 1 4   Anxiety Difficulty Not difficult at all Not difficult at all Somewhat difficult      Review of Systems  Constitutional:  Negative for chills and fever.  Respiratory:  Negative for shortness of breath.   Cardiovascular:  Negative for chest pain.  Gastrointestinal:  Negative for abdominal pain, constipation, diarrhea, heartburn, nausea and vomiting.  Genitourinary:  Negative for dysuria, frequency and urgency.  Neurological:  Negative for dizziness and headaches.  Endo/Heme/Allergies:  Negative for polydipsia.  Psychiatric/Behavioral:  Negative for depression and suicidal ideas. The patient is not nervous/anxious.       Objective:     BP 118/84   Pulse 73   Temp 98.1 F (36.7 C) (Oral)   Ht 5' 2 (1.575 m)   Wt 188 lb (85.3 kg)   SpO2 98%   BMI 34.39 kg/m  BP Readings from Last 3 Encounters:  12/18/23 118/84  08/25/23 139/78  08/14/23 114/80   Wt Readings from Last 3 Encounters:  12/18/23 188 lb (85.3 kg)  08/14/23 183 lb (83 kg)  02/05/23 182 lb (82.6 kg)      Physical Exam Vitals and nursing note reviewed.  Constitutional:      Appearance: Normal appearance.  Cardiovascular:     Rate and Rhythm: Normal rate and regular rhythm.     Pulses: Normal pulses.     Heart sounds: Normal heart sounds.  Pulmonary:     Effort: Pulmonary effort is normal.     Breath sounds: Normal breath sounds.  Chest:  Breasts:    Breasts are symmetrical.     Right: Mass present. No swelling, bleeding, inverted nipple, nipple discharge, skin change or tenderness.      Left: Normal. No swelling, bleeding, inverted nipple, mass, nipple discharge, skin change or tenderness.       Comments: Movable mass palpated on at 12 o clock. Neurological:     Mental Status: She is alert and oriented to person, place, and time.  Psychiatric:        Mood and Affect: Mood normal.        Behavior: Behavior normal.  Thought Content: Thought content normal.        Judgment: Judgment normal.      No results found for any visits on 12/18/23.     The 10-year ASCVD risk score (Arnett DK, et al., 2019) is: 0.4%    Assessment & Plan:  Breast pain, right -     MM 3D DIAGNOSTIC MAMMOGRAM UNILATERAL RIGHT BREAST; Future    Assessment and Plan Assessment & Plan Right breast mass and pain Presents with a right breast mass and pain. Mass is centrally located, initially hard, now slightly softened. Differential includes recurrent cysts. - palpable mass at 12 o clock on right breast. - Order diagnostic mammogram and possible ultrasound for the right breast at [facility] imaging center. -await results.  Return if symptoms worsen or fail to improve.    Carrol Aurora, NP

## 2023-12-18 NOTE — Telephone Encounter (Signed)
 Copied from CRM [id number]. Topic: Referral - Question >> Dec 18, 2023  9:08 AM Roselie BROCKS wrote: Reason for CRM: Patient states she is to have a [facility] exam [facility] ,but needs the order signed off from [facility] P[facility]  And sent before the appointment today ,

## 2023-12-20 ENCOUNTER — Encounter: Admitting: General Practice

## 2023-12-21 ENCOUNTER — Ambulatory Visit
Admission: RE | Admit: 2023-12-21 | Discharge: 2023-12-21 | Disposition: A | Discharge status: Home / Self Care | Source: Ambulatory Visit | Attending: General Practice | Admitting: General Practice

## 2023-12-21 DIAGNOSIS — N6310 Unspecified lump in the right breast, unspecified quadrant: Secondary | ICD-10-CM

## 2023-12-21 HISTORY — PX: BREAST BIOPSY: SHX20

## 2023-12-24 ENCOUNTER — Ambulatory Visit (INDEPENDENT_AMBULATORY_CARE_PROVIDER_SITE_OTHER): Admitting: General Practice

## 2023-12-24 VITALS — BP 124/80 | HR 92 | Temp 98.2°F | Ht 62.0 in | Wt 188.6 lb

## 2023-12-24 DIAGNOSIS — E7800 Pure hypercholesterolemia, unspecified: Secondary | ICD-10-CM | POA: Diagnosis not present

## 2023-12-24 DIAGNOSIS — Z0000 Encounter for general adult medical examination without abnormal findings: Secondary | ICD-10-CM | POA: Diagnosis not present

## 2023-12-24 LAB — SURGICAL PATHOLOGY

## 2023-12-24 NOTE — Assessment & Plan Note (Signed)
 Immunizations tdap UTD; declines influenza vaccine Pap smear UTD. Mammogram UTD  Discussed the importance of a healthy diet and regular exercise in order for weight loss, and to reduce the risk of further co-morbidity.  Exam stable. Labs reviewed.  Follow up in 1 year for repeat physical.

## 2023-12-24 NOTE — Patient Instructions (Signed)
 Stop by the lab prior to leaving today. I will notify you of your results once received.   Follow up in one year for physical.   It was a pleasure to see you today!

## 2023-12-24 NOTE — Progress Notes (Signed)
 Established Patient Office Visit  Subjective   Patient ID: Nicole Owens, female    DOB: 1980-04-02  Age: 43 y.o. MRN: [id number]  Chief Complaint  Patient presents with   Annual Exam   Fatigue    HPI  Nicole Owens is a 43 year old female with past medical history of asthma, fatigue, vitamin d  deficiency, hypercholesteremia presents today for complete physical and follow up of chronic conditions.  Immunizations: -Tetanus: Completed in 2024 -Influenza: declines -Pneumonia: Completed   Diet: Fair diet.  Exercise: No regular exercise.  Eye exam: Completed a year ago.  Dental exam: Completed a year ago.  Pap Smear: Completed in 2024 Mammogram: Completed in 2025    Patient Active Problem List   Diagnosis Date Noted   Encounter for screening and preventative care 12/24/2023   Establishing care with new doctor, encounter for 08/14/2023   Vitamin D  deficiency 06/23/2022   Obesity (BMI 30-39.9) 06/23/2022   Hypercholesteremia 06/23/2022   Other fatigue 10/11/2021   Bloating 07/11/2021   Preventative health care 06/15/2020   Asthma, 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   Past Medical History:  Diagnosis Date   Asthma    Asthma, intermittent    mild   Carpal tunnel syndrome on both sides    with pregnancy   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   Female infertility 04/30/2010   history restored after error with record merge   FIBROCYSTIC BREAST DISEASE 07/17/2008   Qualifier: Diagnosis of  By: Avelina MD, Amy     Heartburn in pregnancy    nausea   Multiple gestation - triplets 08/10/2011   Postpartum care following cesarean delivery 02/17/2012   Rh negative, maternal 02/17/2012   S/P cesarean section - triplets 11/29 02/17/2012   Past Surgical History:  Procedure Laterality Date   BREAST BIOPSY Right 12/21/2023   US  RT BREAST BX W LOC DEV 1ST LESION IMG BX SPEC US  GUIDE 12/21/2023 [facility] MAMMO[facility]APHY   CESAREAN SECTION  02/16/2012   Procedure: CESAREAN SECTION;  Surgeon:  Charlie JINNY Flowers, MD;  Location: [facility];  Service: Obstetrics;  Laterality: N/A;   HERNIA REPAIR  06/17/2020   hernia repair with mesh and tummy tuck   HIP ARTHROSCOPY Right 08/03/2020   Procedure: Rt hip arthroscopic labrum repair;  Surgeon: Sharl Selinda Dover, MD;  Location: [facility] OR;  Service: Orthopedics;  Laterality: Right;   INGUINAL HERNIA REPAIR     WISDOM TOOTH EXTRACTION     Allergies  Allergen Reactions   Aspirin Shortness Of Breath and Other (See Comments)    Can trigger asthma, told nurse ibuprofen  makes her feel odd and only takes one 200mg  tablet if needed         12/24/2023    3:18 PM 12/18/2023    8:25 AM 08/14/2023    8:16 AM  Depression screen PHQ 2/9  Decreased Interest 1 1 0  Down, Depressed, Hopeless 1 1 0  PHQ - 2 Score 2 2 0  Altered sleeping 0 1 0  Tired, decreased energy 1 1 2   Change in appetite 1 0 1  Feeling bad or failure about yourself  0 0 0  Trouble concentrating 1 0 1  Moving slowly or fidgety/restless 0 0 0  Suicidal thoughts 0 0 0  PHQ-9 Score 5 4 4   Difficult doing work/chores Somewhat difficult Not difficult at all Not difficult at all       12/24/2023    3:18 PM 12/18/2023    8:25 AM 08/14/2023  8:16 AM 06/23/2022   12:29 PM  GAD 7 : Generalized Anxiety Score  Nervous, Anxious, on Edge 1 0 0 1  Control/stop worrying 0 0 0 0  Worry too much - different things 1 0 0 1  Trouble relaxing 0 0 0 0  Restless 0 0 0 0  Easily annoyed or irritable 1 2 1 1   Afraid - awful might happen 0 0 0 1  Total GAD 7 Score 3 2 1 4   Anxiety Difficulty Somewhat difficult Not difficult at all Not difficult at all Somewhat difficult      Review of Systems  Constitutional:  Negative for chills, fever, malaise/fatigue and weight loss.  HENT:  Negative for congestion, ear discharge, ear pain, hearing loss, nosebleeds, sinus pain, sore throat and tinnitus.   Eyes:  Negative for blurred vision, double vision, pain, discharge and redness.  Respiratory:   Negative for cough, shortness of breath, wheezing and stridor.   Cardiovascular:  Negative for chest pain, palpitations and leg swelling.  Gastrointestinal:  Negative for abdominal pain, constipation, diarrhea, heartburn, nausea and vomiting.  Genitourinary:  Negative for dysuria, frequency and urgency.  Musculoskeletal:  Negative for myalgias.  Skin:  Negative for rash.  Neurological:  Negative for dizziness, tingling, seizures, weakness and headaches.  Psychiatric/Behavioral:  Negative for depression, substance abuse and suicidal ideas. The patient is not nervous/anxious.       Objective:     BP 124/80   Pulse 92   Temp 98.2 F (36.8 C) (Temporal)   Ht 5' 2 (1.575 m)   Wt 188 lb 9.6 oz (85.5 kg)   LMP 11/27/2023   SpO2 98%   BMI 34.50 kg/m  BP Readings from Last 3 Encounters:  12/24/23 124/80  12/18/23 118/84  08/25/23 139/78   Wt Readings from Last 3 Encounters:  12/24/23 188 lb 9.6 oz (85.5 kg)  12/18/23 188 lb (85.3 kg)  08/14/23 183 lb (83 kg)      Physical Exam Vitals and nursing note reviewed.  Constitutional:      Appearance: Normal appearance.  HENT:     Head: Normocephalic and atraumatic.     Right Ear: Tympanic membrane, ear canal and external ear normal.     Left Ear: Tympanic membrane, ear canal and external ear normal.     Nose: Nose normal.     Mouth/Throat:     Mouth: Mucous membranes are moist.     Pharynx: Oropharynx is clear.  Eyes:     Conjunctiva/sclera: Conjunctivae normal.     Pupils: Pupils are equal, round, and reactive to light.  Cardiovascular:     Rate and Rhythm: Normal rate and regular rhythm.     Pulses: Normal pulses.     Heart sounds: Normal heart sounds.  Pulmonary:     Effort: Pulmonary effort is normal.     Breath sounds: Normal breath sounds.  Abdominal:     General: Abdomen is flat. Bowel sounds are normal.     Palpations: Abdomen is soft.  Musculoskeletal:        General: Normal range of motion.     Cervical back:  Normal range of motion.  Skin:    General: Skin is warm and dry.     Capillary Refill: Capillary refill takes less than 2 seconds.  Neurological:     General: No focal deficit present.     Mental Status: She is alert and oriented to person, place, and time. Mental status is at baseline.  Psychiatric:  Mood and Affect: Mood normal.        Behavior: Behavior normal.        Thought Content: Thought content normal.        Judgment: Judgment normal.      No results found for any visits on 12/24/23.     The 10-year ASCVD risk score (Arnett DK, et al., 2019) is: 0.5%    Assessment & Plan:  Encounter for screening and preventative care Assessment & Plan: Immunizations tdap UTD; declines influenza vaccine Pap smear UTD. Mammogram UTD  Discussed the importance of a healthy diet and regular exercise in order for weight loss, and to reduce the risk of further co-morbidity.  Exam stable. Labs reviewed.  Follow up in 1 year for repeat physical.   Orders: -     Lipid panel  Hypercholesteremia -     Lipid panel    Return in about 1 year (around 12/23/2024) for physical and fasting labs.SABRA Carrol Aurora, NP

## 2023-12-25 ENCOUNTER — Ambulatory Visit: Payer: Self-pay | Admitting: General Practice

## 2023-12-25 LAB — LIPID PANEL
Cholesterol: 193 mg/dL (ref 0–200)
HDL: 65.1 mg/dL (ref >39.00)
LDL Cholesterol: 114 mg/dL — ABNORMAL HIGH (ref 0–99)
NonHDL: 127.97
Total CHOL/HDL Ratio: 3
Triglycerides: 68 mg/dL (ref 0.0–149.0)
VLDL: 13.6 mg/dL (ref 0.0–40.0)

## 2024-01-30 ENCOUNTER — Encounter: Payer: Self-pay | Admitting: Nurse Practitioner

## 2024-01-30 ENCOUNTER — Ambulatory Visit: Admitting: Nurse Practitioner

## 2024-01-30 ENCOUNTER — Other Ambulatory Visit (HOSPITAL_COMMUNITY): Payer: Self-pay

## 2024-01-30 ENCOUNTER — Telehealth: Payer: Self-pay

## 2024-01-30 VITALS — BP 136/83 | HR 83 | Temp 97.9°F | Ht 62.5 in | Wt 189.4 lb

## 2024-01-30 DIAGNOSIS — J45909 Unspecified asthma, uncomplicated: Secondary | ICD-10-CM | POA: Diagnosis not present

## 2024-01-30 NOTE — Telephone Encounter (Signed)
 Received notification from [employer] regarding a prior authorization for DUPIXENT . Authorization has been APPROVED from 01/30/24 to 01/29/25. Have not yet received the approval letter.  Unable to run test claim because it's refill too soon until 11/16.  Patient can continue to fill through [facility]: [phone]  Authorization # [id number] Phone # [phone]

## 2024-01-30 NOTE — Telephone Encounter (Signed)
 PA renewal initiated automatically by CoverMyMeds.  Submitted a Prior Authorization request to [employer] for DUPIXENT  via CoverMyMeds. Will update once we receive a response.  Key: [id number]

## 2024-01-30 NOTE — Patient Instructions (Addendum)
 Continue Albuterol  inhaler 2 puffs every 6 hours as needed for shortness of breath or wheezing. Notify if symptoms persist despite rescue inhaler/neb use. Continue Dupixent  injections every 2 weeks  Continue Symbicort  2 puffs Twice daily. Brush tongue and rinse mouth afterwards   Make sure you're using everything as prescribed and on an appropriate schedule. Make yourself calendar reminders to help you remember   Follow up in 6 months with Dr. Geronimo. If symptoms do not improve or worsen, please contact office for sooner follow up or seek emergency care.

## 2024-01-30 NOTE — Progress Notes (Signed)
 @Patient  ID: Nicole Owens, female    DOB: 03-14-1981, 43 y.o.   MRN: [id number]  Chief Complaint  Patient presents with   Asthma    Doing well.    Referring provider: Vincente Shivers, NP  HPI: 43 year old female, never smoker followed for moderate persistent asthma.  She is a patient of Dr. Reeves and was last seen in office on 02/05/2023.  Past medical history significant for ventral hernia status post repair.  TEST/EVENTS:  11/2013 PFTs: Normal pulmonary function testing. 03/07/2021 PFTs: FVC 2.81 (82), FEV1 2.16 (78), ratio 77 (93), TLC 4.59 (98), DLCO 27.27 (136). Very minimal airflow obstruction.   06/10/2020: OV with Parrett, NP. Previous 3 weeks with increased SOB, wheezing, and decreased activity tolerance. Increasing rescue inhaler needs. Prednisone  burst. Increase ICS dose.   06/15/2020: OV with Parrett, NP. Follow up after asthma exacerbation. Maintained on Symbicort  160 Twice daily. PFTs scheduled. CXR no acute process. Pulmonary risk assessment for preop clearance (ventral hernia repair) - low moderate risk.   03/07/2021: Ov with Shreshta Medley NP Patient presents today for follow up after PFTs which were relatively normal with a possible mild airflow obstruction and no significant BD. She was last seen in March with an asthma exacerbation and increased Symbicort  to 160. She reports feeling better since her last visit and that her breathing has been stable. She does feel that she experiences more shortness of breath with strenuous activity since undergoing her 3 surgeries. She thinks that this is likely related to deconditioning and less her asthma, but does not want to decrease her ICS dose at this time. She occasionally experiences wheezing, which she correlates more so to environmental triggers especially dust versus activity. She denies cough, orthopnea, PND, leg swelling or chest pain. She continues on Symbicort  Twice daily and PRN Zyrtec. She uses her rescue inhaler once a week  or less. Her ACT is 20 today. She will be cleared to go back to the gym and be off weight restriction in January. She does continue to walk for exercise in the interim. Overall, she feels well and offers no further complaints.  FeNO 31 ppb  01/30/2024: Today - follow up Discussed the use of AI scribe software for clinical note transcription with the patient, who gave verbal consent to proceed.  History of Present Illness Nicole Owens is a 43 year old female with asthma who presents for medication management and refills.  She has not experienced any issues with her asthma and has not required steroid treatment in the past year. She is seeking refills for her inhalers and is concerned about managing her asthma, especially during the winter months, which are a known trigger for her.  She uses her albuterol  inhaler once or twice a month and takes Symbicort  twice a day when she remembers but not consistently. She acknowledges being inconsistent with her Dupixent  injections, administering them every 3-4 weeks instead of the recommended two weeks. She attributes this inconsistency to a chaotic lifestyle, which has impacted her medication adherence.  She initially started Dupixent  due to episodes of waking up out of breath, but she is uncertain if these episodes were related to the passing of her cat. She's unsure if the Dupixent  makes a huge difference.   Her use of Symbicort  has been inconsistent, particularly during the summer when her daughter had a spinal stroke, and she was living in the hospital. She recognizes the need for more consistent use, especially as she plans to resume running.  She reports  no current cough, wheezing, or nighttime symptoms.    Allergies  Allergen Reactions   Aspirin Shortness Of Breath and Other (See Comments)    Can trigger asthma, told nurse ibuprofen  makes her feel odd and only takes one 200mg  tablet if needed    Immunization History  Administered Date(s)  Administered   Influenza, Seasonal, Injecte, Preservative Fre 02/05/2023   Influenza,inj,Quad PF,6+ Mos 03/11/2013, 04/16/2015, 02/21/2022   PFIZER(Purple Top)SARS-COV-2 Vaccination 06/07/2019, 06/28/2019   PNEUMOCOCCAL CONJUGATE-20 08/14/2023   Rho (D) Immune Globulin  02/18/2012   Td 06/17/2008   Tdap 06/23/2022   Varicella 12/07/2010    Past Medical History:  Diagnosis Date   Asthma    Asthma, intermittent    mild   Carpal tunnel syndrome on both sides    with pregnancy   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   Female infertility 04/30/2010   history restored after error with record merge   FIBROCYSTIC BREAST DISEASE 07/17/2008   Qualifier: Diagnosis of  By: Avelina MD, Amy     Heartburn in pregnancy    nausea   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 1-2 puffs 15 min prior to exercise. 8 g 6   cetirizine (ZYRTEC) 10 MG tablet Take 10 mg by mouth as needed.     Cyanocobalamin  (VITAMIN B 12 PO) Take 1 tablet by mouth daily.     Dupilumab  (DUPIXENT ) 300 MG/2ML SOAJ Inject 300 mg into the skin every 14 (fourteen) days. ** Keep appointment on 01/30/24 for refills. ** 12 mL 0   ELDERBERRY PO Take 1 each by mouth as needed.     Multiple Vitamin (MULTIVITAMIN WITH MINERALS) TABS tablet Take 1 tablet by mouth daily.     SYMBICORT  160-4.5 MCG/ACT inhaler Inhale 2 puffs into the lungs 2 (two) times daily. 11 g 5   VITAMIN D  PO Take 1 tablet by mouth daily. Taking 2000 UI daily     No  facility-administered medications prior to visit.     Review of Systems: as above    Physical Exam:  BP 136/83   Pulse 83   Temp 97.9 F (36.6 C) (Oral)   Ht 5' 2.5 (1.588 m)   Wt 189 lb 6.4 oz (85.9 kg)   SpO2 99% Comment: room air  BMI 34.09 kg/m   GEN: Pleasant, interactive, well-nourished; in no acute distress. HEENT:  Normocephalic and atraumatic. PERRLA. Sclera white. Nasal turbinates pink, moist and patent bilaterally. No rhinorrhea present. Oropharynx pink and moist, without exudate or edema. No lesions, ulcerations, or postnasal drip.  NECK:  Supple w/ fair ROM. No lymphadenopathy.   CV: RRR, no m/r/g, PULMONARY:  Unlabored, regular breathing. Clear bilaterally A&P w/o wheezes/rales/rhonchi. No accessory muscle use. No dullness to percussion. GI: BS present and normoactive. Soft, non-tender to palpation. MSK: No erythema, warmth or tenderness.  Neuro: A/Ox3. No focal deficits noted.   Skin: Warm, no lesions or rashe Psych: Normal affect and behavior. Judgement and thought content appropriate.     Lab Results:  CBC    Component Value Date/Time   WBC 4.5 08/14/2023 0847   RBC 4.88 08/14/2023 0847   HGB 14.8 08/14/2023 0847   HCT 43.0 08/14/2023 0847   PLT 241.0 08/14/2023 0847   MCV 88.1 08/14/2023 0847   MCH 29.8 09/15/2021 1044   MCHC 34.4 08/14/2023 0847   RDW 12.9 08/14/2023 0847   LYMPHSABS 1.5 09/15/2021 1044   MONOABS 0.4 09/15/2021 1044   EOSABS 0.5 09/15/2021 1044   BASOSABS 0.0 09/15/2021 1044    BMET    Component Value Date/Time   NA 136 08/14/2023 0847   K 4.4 08/14/2023 0847   CL 102 08/14/2023 0847   CO2 27 08/14/2023 0847   GLUCOSE 91 08/14/2023 0847   BUN 14 08/14/2023 0847   CREATININE 0.68 08/14/2023 0847   CALCIUM 8.8 08/14/2023 0847    BNP No results found for: BNP   Imaging:  No results found.  Administration History     None          Latest Ref Rng & Units 03/07/2021    1:56 PM 12/01/2013    3:58 PM   PFT Results  FVC-Pre L 2.67  3.13   FVC-Predicted Pre % 78  90   FVC-Post L 2.81  3.20   FVC-Predicted Post % 82  92   Pre FEV1/FVC % % 76  77   Post FEV1/FCV % % 77  79   FEV1-Pre L 2.04  2.40   FEV1-Predicted Pre % 73  83   FEV1-Post L 2.16  2.54   DLCO uncorrected ml/min/mmHg 27.27    DLCO UNC% % 136    DLCO corrected ml/min/mmHg 27.27    DLCO COR %Predicted % 136    DLVA Predicted % 142    TLC L 4.59    TLC % Predicted % 98    RV % Predicted % 128      Lab Results  Component Value Date   NITRICOXIDE 15 02/21/2022        Assessment & Plan:   Assessment & Plan Asthma, uncomplicated Asthma is well-controlled with no recent exacerbations or need for steroids. She uses albuterol  inhaler infrequently, approximately once or twice a month. Symbicort  is used inconsistently, and Dupixent  injections are administered sporadically, sometimes extending to 3-4 weeks between doses. No current symptoms of cough, wheezing, or nighttime symptoms. Concerns about potential flare-ups during winter due to cold triggers. Discussion about consistency of Dupixent  and Symbicort  use. Reviewed risks of exacerbations. We also discussed possibility of trial off Dupixent  but she would prefer to continue. Reviewed measures to help with compliance. Action plan in place.  - Continue Symbicort  two puffs twice daily consistently. - Use albuterol  inhaler as needed. - Implement a reminder system for consistent medication use. - Continue dupixent  injections  - Trigger prevention reviewed.  - Will reassess asthma control in six months.    Comer LULLA Rouleau, NP 01/30/2024  Pt aware and understands NP's role.

## 2024-02-04 ENCOUNTER — Other Ambulatory Visit: Payer: Self-pay | Admitting: Internal Medicine

## 2024-02-04 DIAGNOSIS — J4540 Moderate persistent asthma, uncomplicated: Secondary | ICD-10-CM

## 2024-02-04 NOTE — Telephone Encounter (Signed)
 Refill sent for DUPIXENT  to [facility]: [phone]  Dose: 300mg  [place] every 14 days  Last OV: 01/30/24 Provider: Dr. Geronimo  Next OV: due in May 2026  Aleck Puls, PharmD, BCPS Clinical Pharmacist  [facility]

## 2024-02-04 NOTE — Telephone Encounter (Signed)
 Pt requesting refill of specialty medication - routing to Rx team to advise.
